# Patient Record
Sex: Male | Born: 1945
Health system: Southern US, Community
[De-identification: ages and names within clinical notes are randomized; demographics above are authoritative.]

## PROBLEM LIST (undated history)

## (undated) DIAGNOSIS — N390 Urinary tract infection, site not specified: Secondary | ICD-10-CM

## (undated) DIAGNOSIS — I951 Orthostatic hypotension: Secondary | ICD-10-CM

## (undated) DIAGNOSIS — W19XXXA Unspecified fall, initial encounter: Secondary | ICD-10-CM

## (undated) DIAGNOSIS — I700 Atherosclerosis of aorta: Secondary | ICD-10-CM

## (undated) DIAGNOSIS — T4145XA Adverse effect of unspecified anesthetic, initial encounter: Secondary | ICD-10-CM

## (undated) DIAGNOSIS — I10 Essential (primary) hypertension: Secondary | ICD-10-CM

## (undated) DIAGNOSIS — F0390 Unspecified dementia without behavioral disturbance: Secondary | ICD-10-CM

## (undated) DIAGNOSIS — N200 Calculus of kidney: Secondary | ICD-10-CM

## (undated) DIAGNOSIS — I639 Cerebral infarction, unspecified: Secondary | ICD-10-CM

## (undated) DIAGNOSIS — K5090 Crohn's disease, unspecified, without complications: Secondary | ICD-10-CM

## (undated) DIAGNOSIS — F329 Major depressive disorder, single episode, unspecified: Secondary | ICD-10-CM

## (undated) DIAGNOSIS — F419 Anxiety disorder, unspecified: Secondary | ICD-10-CM

## (undated) DIAGNOSIS — F32A Depression, unspecified: Secondary | ICD-10-CM

## (undated) DIAGNOSIS — M1990 Unspecified osteoarthritis, unspecified site: Secondary | ICD-10-CM

## (undated) DIAGNOSIS — Y92009 Unspecified place in unspecified non-institutional (private) residence as the place of occurrence of the external cause: Secondary | ICD-10-CM

## (undated) DIAGNOSIS — E7800 Pure hypercholesterolemia, unspecified: Secondary | ICD-10-CM

## (undated) DIAGNOSIS — E119 Type 2 diabetes mellitus without complications: Secondary | ICD-10-CM

## (undated) DIAGNOSIS — T8859XA Other complications of anesthesia, initial encounter: Secondary | ICD-10-CM

## (undated) HISTORY — PX: APPENDECTOMY: SHX54

## (undated) HISTORY — DX: Major depressive disorder, single episode, unspecified: F32.9

## (undated) HISTORY — PX: OTHER SURGICAL HISTORY: SHX169

## (undated) HISTORY — DX: Pure hypercholesterolemia, unspecified: E78.00

## (undated) HISTORY — DX: Crohn's disease, unspecified, without complications: K50.90

## (undated) HISTORY — DX: Type 2 diabetes mellitus without complications: E11.9

## (undated) HISTORY — PX: TONSILLECTOMY AND ADENOIDECTOMY: SHX28

## (undated) HISTORY — DX: Depression, unspecified: F32.A

## (undated) HISTORY — DX: Anxiety disorder, unspecified: F41.9

## (undated) HISTORY — DX: Cerebral infarction, unspecified: I63.9

## (undated) HISTORY — PX: TONSILLECTOMY: SUR1361

## (undated) HISTORY — PX: ANGIOPLASTY / STENTING FEMORAL: SUR30

## (undated) HISTORY — DX: Unspecified osteoarthritis, unspecified site: M19.90

## (undated) HISTORY — DX: Essential (primary) hypertension: I10

## (undated) HISTORY — DX: Calculus of kidney: N20.0

---

## 2000-06-18 ENCOUNTER — Ambulatory Visit (HOSPITAL_COMMUNITY): Admission: RE | Admit: 2000-06-18 | Discharge: 2000-06-18 | Payer: Self-pay | Admitting: Gastroenterology

## 2011-12-08 DIAGNOSIS — I2510 Atherosclerotic heart disease of native coronary artery without angina pectoris: Secondary | ICD-10-CM | POA: Diagnosis not present

## 2011-12-08 DIAGNOSIS — E785 Hyperlipidemia, unspecified: Secondary | ICD-10-CM | POA: Diagnosis not present

## 2011-12-08 DIAGNOSIS — I10 Essential (primary) hypertension: Secondary | ICD-10-CM | POA: Diagnosis not present

## 2011-12-08 DIAGNOSIS — Z79899 Other long term (current) drug therapy: Secondary | ICD-10-CM | POA: Diagnosis not present

## 2011-12-13 DIAGNOSIS — N23 Unspecified renal colic: Secondary | ICD-10-CM | POA: Diagnosis not present

## 2011-12-14 DIAGNOSIS — N3000 Acute cystitis without hematuria: Secondary | ICD-10-CM | POA: Diagnosis not present

## 2012-03-19 DIAGNOSIS — J019 Acute sinusitis, unspecified: Secondary | ICD-10-CM | POA: Diagnosis not present

## 2012-03-19 DIAGNOSIS — R05 Cough: Secondary | ICD-10-CM | POA: Diagnosis not present

## 2012-03-23 DIAGNOSIS — J209 Acute bronchitis, unspecified: Secondary | ICD-10-CM | POA: Diagnosis not present

## 2012-08-14 DIAGNOSIS — Z8711 Personal history of peptic ulcer disease: Secondary | ICD-10-CM | POA: Diagnosis not present

## 2012-08-14 DIAGNOSIS — K5080 Crohn's disease of both small and large intestine without complications: Secondary | ICD-10-CM | POA: Diagnosis not present

## 2012-09-20 DIAGNOSIS — K5080 Crohn's disease of both small and large intestine without complications: Secondary | ICD-10-CM | POA: Diagnosis not present

## 2012-09-20 DIAGNOSIS — K219 Gastro-esophageal reflux disease without esophagitis: Secondary | ICD-10-CM | POA: Diagnosis not present

## 2012-10-05 DIAGNOSIS — Z23 Encounter for immunization: Secondary | ICD-10-CM | POA: Diagnosis not present

## 2012-11-20 DIAGNOSIS — E119 Type 2 diabetes mellitus without complications: Secondary | ICD-10-CM | POA: Diagnosis not present

## 2012-11-20 DIAGNOSIS — M459 Ankylosing spondylitis of unspecified sites in spine: Secondary | ICD-10-CM | POA: Diagnosis not present

## 2012-11-20 DIAGNOSIS — F329 Major depressive disorder, single episode, unspecified: Secondary | ICD-10-CM | POA: Diagnosis not present

## 2012-11-20 DIAGNOSIS — E782 Mixed hyperlipidemia: Secondary | ICD-10-CM | POA: Diagnosis not present

## 2012-11-20 DIAGNOSIS — I10 Essential (primary) hypertension: Secondary | ICD-10-CM | POA: Diagnosis not present

## 2012-11-20 DIAGNOSIS — F411 Generalized anxiety disorder: Secondary | ICD-10-CM | POA: Diagnosis not present

## 2012-12-05 DIAGNOSIS — H25099 Other age-related incipient cataract, unspecified eye: Secondary | ICD-10-CM | POA: Diagnosis not present

## 2012-12-11 DIAGNOSIS — J209 Acute bronchitis, unspecified: Secondary | ICD-10-CM | POA: Diagnosis not present

## 2012-12-11 DIAGNOSIS — J018 Other acute sinusitis: Secondary | ICD-10-CM | POA: Diagnosis not present

## 2013-02-19 DIAGNOSIS — K589 Irritable bowel syndrome without diarrhea: Secondary | ICD-10-CM | POA: Diagnosis not present

## 2013-02-19 DIAGNOSIS — K219 Gastro-esophageal reflux disease without esophagitis: Secondary | ICD-10-CM | POA: Diagnosis not present

## 2013-03-21 DIAGNOSIS — K219 Gastro-esophageal reflux disease without esophagitis: Secondary | ICD-10-CM | POA: Diagnosis not present

## 2013-04-08 DIAGNOSIS — IMO0001 Reserved for inherently not codable concepts without codable children: Secondary | ICD-10-CM | POA: Diagnosis not present

## 2013-04-08 DIAGNOSIS — E559 Vitamin D deficiency, unspecified: Secondary | ICD-10-CM | POA: Diagnosis not present

## 2013-04-08 DIAGNOSIS — E782 Mixed hyperlipidemia: Secondary | ICD-10-CM | POA: Diagnosis not present

## 2013-04-08 DIAGNOSIS — R0789 Other chest pain: Secondary | ICD-10-CM | POA: Diagnosis not present

## 2013-04-08 DIAGNOSIS — E119 Type 2 diabetes mellitus without complications: Secondary | ICD-10-CM | POA: Diagnosis not present

## 2013-04-08 DIAGNOSIS — Z125 Encounter for screening for malignant neoplasm of prostate: Secondary | ICD-10-CM | POA: Diagnosis not present

## 2013-04-08 DIAGNOSIS — I10 Essential (primary) hypertension: Secondary | ICD-10-CM | POA: Diagnosis not present

## 2013-08-15 DIAGNOSIS — J209 Acute bronchitis, unspecified: Secondary | ICD-10-CM | POA: Diagnosis not present

## 2013-08-15 DIAGNOSIS — J018 Other acute sinusitis: Secondary | ICD-10-CM | POA: Diagnosis not present

## 2013-09-11 DIAGNOSIS — R0609 Other forms of dyspnea: Secondary | ICD-10-CM | POA: Diagnosis not present

## 2013-09-11 DIAGNOSIS — M542 Cervicalgia: Secondary | ICD-10-CM | POA: Diagnosis not present

## 2013-09-11 DIAGNOSIS — K5080 Crohn's disease of both small and large intestine without complications: Secondary | ICD-10-CM | POA: Diagnosis not present

## 2013-09-11 DIAGNOSIS — E119 Type 2 diabetes mellitus without complications: Secondary | ICD-10-CM | POA: Diagnosis not present

## 2013-09-11 DIAGNOSIS — R05 Cough: Secondary | ICD-10-CM | POA: Diagnosis not present

## 2013-09-11 DIAGNOSIS — R5381 Other malaise: Secondary | ICD-10-CM | POA: Diagnosis not present

## 2013-09-11 DIAGNOSIS — E559 Vitamin D deficiency, unspecified: Secondary | ICD-10-CM | POA: Diagnosis not present

## 2013-09-17 ENCOUNTER — Encounter: Payer: Self-pay | Admitting: Internal Medicine

## 2013-09-17 ENCOUNTER — Ambulatory Visit (INDEPENDENT_AMBULATORY_CARE_PROVIDER_SITE_OTHER)
Admission: RE | Admit: 2013-09-17 | Discharge: 2013-09-17 | Disposition: A | Discharge status: Home / Self Care | Payer: Medicare Other | Source: Ambulatory Visit | Attending: Internal Medicine | Admitting: Internal Medicine

## 2013-09-17 ENCOUNTER — Ambulatory Visit (INDEPENDENT_AMBULATORY_CARE_PROVIDER_SITE_OTHER): Payer: Medicare Other | Admitting: Internal Medicine

## 2013-09-17 VITALS — BP 140/88 | HR 62 | Temp 98.8°F | Ht 68.5 in | Wt 181.2 lb

## 2013-09-17 DIAGNOSIS — R0609 Other forms of dyspnea: Secondary | ICD-10-CM

## 2013-09-17 DIAGNOSIS — R0600 Dyspnea, unspecified: Secondary | ICD-10-CM

## 2013-09-17 DIAGNOSIS — R05 Cough: Secondary | ICD-10-CM

## 2013-09-17 DIAGNOSIS — R059 Cough, unspecified: Secondary | ICD-10-CM

## 2013-09-17 DIAGNOSIS — J45991 Cough variant asthma: Secondary | ICD-10-CM | POA: Insufficient documentation

## 2013-09-17 DIAGNOSIS — J3489 Other specified disorders of nose and nasal sinuses: Secondary | ICD-10-CM | POA: Diagnosis not present

## 2013-09-17 MED ORDER — NEBIVOLOL HCL 5 MG PO TABS: 5.0000 mg | ORAL_TABLET | Freq: Every day | ORAL | Status: DC

## 2013-09-17 NOTE — Patient Instructions (Addendum)
Prilosec 20 mg Take 2 x 30-60 min before first meal of the day  And Pepcid ac 20 mg at bedtime  Elevate the head of your bed if you can  Please see patient coordinator before you leave today  to schedule sinus ct asap  Finish prednisone  Stop uroxatrol(afuzolsin) and start bystolic 5 mg daily   GERD (REFLUX)  is an extremely common cause of respiratory symptoms, many times with no significant heartburn at all.    It can be treated with medication, but also with lifestyle changes including avoidance of late meals, excessive alcohol, smoking cessation, and avoid fatty foods, chocolate, peppermint, colas, red wine, and acidic juices such as orange juice.  NO MINT OR MENTHOL PRODUCTS SO NO COUGH DROPS  USE SUGARLESS CANDY INSTEAD (jolley ranchers or Stover's)  NO OIL BASED VITAMINS - use powdered substitutes.  Please schedule a follow up office visit in 2 weeks, sooner if needed all medications in hand

## 2013-09-17 NOTE — Progress Notes (Signed)
  Subjective:    Patient ID: Frank Phillips, male    DOB: 10-14-1946   MRN: [id number]  HPI  55 yowm never smoker never respiratory problems referred 09/17/2013 to pulmonary clinic by Dr Owens Shark for ? Copd  09/17/2013 1st [facility] office visit/ Bradford Cazier cc new onset around 2 y prior to OV  Onset cough and congestion presently year round esp worse x one month with hoarseness and much worse lying down with wheezing and tightness nightly when lie down,  Not better p proaire, mucus is green x one month x usually in am,  with ha generalized.  rx with prednisone x 4 days, biaxin, some better. Doe x any exertion above adls.   No obvious day to day or daytime variabilty or assoc  cp or chest tightness, subjective wheeze overt s     hb symptoms. No unusual exp hx or h/o childhood pna/ asthma or knowledge of premature birth.  Sleeping ok without nocturnal  or early am exacerbation  of respiratory  c/o's or need for noct saba. Also denies any obvious fluctuation of symptoms with weather or environmental changes or other aggravating or alleviating factors except as outlined above   Current Medications, Allergies, Complete Past Medical History, Past Surgical History, Family History, and Social History were reviewed in [employer] record.          Review of Systems  Constitutional: Negative for fever and unexpected weight change.  HENT: Positive for congestion, sinus pressure and sneezing. Negative for dental problem, ear pain, nosebleeds, postnasal drip, rhinorrhea, sore throat and trouble swallowing.   Eyes: Negative for redness and itching.  Respiratory: Positive for cough, chest tightness, shortness of breath and wheezing.   Cardiovascular: Negative for palpitations and leg swelling.  Gastrointestinal: Negative for nausea and vomiting.  Genitourinary: Negative for dysuria.  Musculoskeletal: Negative for joint swelling.  Skin: Negative for rash.  Neurological: Positive for  headaches.  Hematological: Does not bruise/bleed easily.  Psychiatric/Behavioral: Positive for dysphoric mood. The patient is nervous/anxious.        Objective:   Physical Exam  Wt Readings from Last 3 Encounters:  09/17/13 181 lb 3.2 oz (82.192 kg)      HEENT: nl dentition, turbinates, and orophanx. Nl external ear canals without cough reflex   NECK :  without JVD/Nodes/TM/ nl carotid upstrokes bilaterally   LUNGS: no acc muscle use, clear to A and P bilaterally without cough on insp or exp maneuvers   CV:  RRR  no s3 or murmur or increase in P2, no edema   ABD:  soft and nontender with nl excursion in the supine position. No bruits or organomegaly, bowel sounds nl  MS:  warm without deformities, calf tenderness, cyanosis or clubbing  SKIN: warm and dry without lesions    NEURO:  alert, approp, no deficits    cxr was done 09/11/13 and reported nl     Assessment & Plan:

## 2013-09-18 NOTE — Progress Notes (Signed)
Quick Note:  Spoke with pt and notified of results per Dr. Wert. Pt verbalized understanding and denied any questions.  ______ 

## 2013-09-19 NOTE — Assessment & Plan Note (Addendum)
-   sinus CT 09/17/2013 > Mucosal edema in the paranasal sinuses without air-fluid level.  The most common causes of chronic cough in immunocompetent adults include the following: upper airway cough syndrome (UACS), previously referred to as postnasal drip syndrome (PNDS), which is caused by variety of rhinosinus conditions; (2) asthma; (3) GERD; (4) chronic bronchitis from cigarette smoking or other inhaled environmental irritants; (5) nonasthmatic eosinophilic bronchitis; and (6) bronchiectasis.   These conditions, singly or in combination, have accounted for up to 94% of the causes of chronic cough in prospective studies.   Other conditions have constituted no >6% of the causes in prospective studies These have included bronchogenic carcinoma, chronic interstitial pneumonia, sarcoidosis, left ventricular failure, ACEI-induced cough, and aspiration from a condition associated with pharyngeal dysfunction.    Chronic cough is often simultaneously caused by more than one condition. A single cause has been found from 38 to 82% of the time, multiple causes from 18 to 62%. Multiply caused cough has been the result of three diseases up to 42% of the time.       Most likely this is  Classic Upper airway cough syndrome, so named because it's frequently impossible to sort out how much is  CR/sinusitis with freq throat clearing (which can be related to primary GERD)   vs  causing  secondary (" extra esophageal")  GERD from wide swings in gastric pressure that occur with throat clearing, often  promoting self use of mint and menthol lozenges that reduce the lower esophageal sphincter tone and exacerbate the problem further in a cyclical fashion.   These are the same pts (now being labeled as having "irritable larynx syndrome" by some cough centers) who not infrequently have a history of having failed to tolerate ace inhibitors,  dry powder inhalers or biphosphonates or report having atypical reflux symptoms that  don't respond to standard doses of PPI , and are easily confused as having aecopd or asthma flares by even experienced allergists/ pulmonologists.   For now max rx for gerd/rhinitis by completing abx plus 6 d pred and regroup in 2 weeks

## 2013-09-19 NOTE — Assessment & Plan Note (Addendum)
Symptoms are markedly disproportionate to objective findings and not clear this is a lung problem (clearly not copd in the classic sense)  but pt does appear to have difficult airway management issues.  DDX of  difficult airways managment all start with A and  include Adherence, Ace Inhibitors, Acid Reflux, Active Sinus Disease, Alpha 1 Antitripsin deficiency, Anxiety masquerading as Airways dz,  ABPA,  allergy(esp in young), Aspiration (esp in elderly), Adverse effects of DPI,  Active smokers, plus two Bs  = Bronchiectasis and Beta blocker use..and one C= CHF  Cross referencing with cough,  Acid reflux at top of list and needs to be eliminated as a leading suspect before proceeding down the ddx  ? adverse effect of drugs - the only one on his list with a potential pulmonary problem is uroxatrol so try off x 2 weeks and rx bystolic for hbp and see if urinary symptoms develop  ? Allergy > pred x 6 days only

## 2013-10-01 ENCOUNTER — Encounter: Payer: Self-pay | Admitting: Internal Medicine

## 2013-10-01 ENCOUNTER — Ambulatory Visit (INDEPENDENT_AMBULATORY_CARE_PROVIDER_SITE_OTHER): Payer: Medicare Other | Admitting: Internal Medicine

## 2013-10-01 VITALS — BP 130/72 | HR 55 | Temp 98.4°F | Ht 68.25 in | Wt 182.6 lb

## 2013-10-01 DIAGNOSIS — R0609 Other forms of dyspnea: Secondary | ICD-10-CM | POA: Diagnosis not present

## 2013-10-01 DIAGNOSIS — R0600 Dyspnea, unspecified: Secondary | ICD-10-CM

## 2013-10-01 DIAGNOSIS — R05 Cough: Secondary | ICD-10-CM | POA: Diagnosis not present

## 2013-10-01 MED ORDER — ALFUZOSIN HCL ER 10 MG PO TB24: 10.0000 mg | ORAL_TABLET | Freq: Every day | ORAL | Status: DC

## 2013-10-01 NOTE — Progress Notes (Signed)
Subjective:    Patient ID: Frank Phillips, male    DOB: [birth year]   MRN: [id number]    Brief patient profile:  12 yowm never smoker never respiratory problems referred 09/17/2013 to pulmonary clinic by Dr Owens Shark for ? Copd   History of Present Illness  09/17/2013 1st [facility] office visit/ Imir Brumbach cc new onset around 2 y prior to OV  Onset cough and congestion presently year round esp worse x one month with hoarseness and much worse lying down with wheezing and tightness nightly when lie down,  Not better p proaire, mucus is green x one month x usually in am,  with ha generalized.  rx with prednisone x 4 days, biaxin, some better. Doe x any exertion above adls rec Prilosec 20 mg Take 2 x 30-60 min before first meal of the day  And Pepcid ac 20 mg at bedtime Elevate the head of your bed if you can   schedule sinus ct asap> thickening only s a/f level  Finish prednisone Stop uroxatrol(afuzolsin) and start bystolic 5 mg daily  GERD diet    10/01/2013 f/u ov/Aaban Griep re: chronic and sob/ unexplained  Chief Complaint  Patient presents with  . Follow-up    Pt states breathing is much improved. No new co's today.  Improved to his satisfaction except more difficulty urination since d/c uroxatrol   No obvious day to day or daytime variabilty or assoc chronic cough or cp or chest tightness, subjective wheeze overt sinus or hb symptoms. No unusual exp hx or h/o childhood pna/ asthma or knowledge of premature birth.  Sleeping ok without nocturnal  or early am exacerbation  of respiratory  c/o's or need for noct saba. Also denies any obvious fluctuation of symptoms with weather or environmental changes or other aggravating or alleviating factors except as outlined above   Current Medications, Allergies, Complete Past Medical History, Past Surgical History, Family History, and Social History were reviewed in [employer] record.  ROS  The following are not active complaints  unless bolded sore throat, dysphagia, dental problems, itching, sneezing,  nasal congestion or excess/ purulent secretions, ear ache,   fever, chills, sweats, unintended wt loss, pleuritic or exertional cp, hemoptysis,  orthopnea pnd or leg swelling, presyncope, palpitations, heartburn, abdominal pain, anorexia, nausea, vomiting, diarrhea  or change in bowel or urinary habits, change in stools or urine, dysuria,hematuria,  rash, arthralgias, visual complaints, headache, numbness weakness or ataxia or problems with walking or coordination,  change in mood/affect or memory.                 Objective:   Physical Exam   Wt Readings from Last 3 Encounters:  10/01/13 182 lb 9.6 oz (82.827 kg)  09/17/13 181 lb 3.2 oz (82.192 kg)         HEENT: nl dentition, turbinates, and orophanx. Nl external ear canals without cough reflex   NECK :  without JVD/Nodes/TM/ nl carotid upstrokes bilaterally   LUNGS: no acc muscle use, clear to A and P bilaterally without cough on insp or exp maneuvers   CV:  RRR  no s3 or murmur or increase in P2, no edema   ABD:  soft and nontender with nl excursion in the supine position. No bruits or organomegaly, bowel sounds nl  MS:  warm without deformities, calf tenderness, cyanosis or clubbing  SKIN: warm and dry without lesions    NEURO:  alert, approp, no deficits    cxr was done 09/11/13  and reported nl     Assessment & Plan:

## 2013-10-01 NOTE — Patient Instructions (Addendum)
Adding uroxatrol back to see if your urination improves  Please schedule a follow up office visit in 4 weeks, sooner if needed with pft's on return  Late add : hand written  WITH ALL MEDICATIONS IN HAND

## 2013-10-02 NOTE — Assessment & Plan Note (Addendum)
Classic Upper airway cough syndrome, so named because it's frequently impossible to sort out how much is  CR/sinusitis with freq throat clearing (which can be related to primary GERD)   vs  causing  secondary (" extra esophageal")  GERD from wide swings in gastric pressure that occur with throat clearing, often  promoting self use of mint and menthol lozenges that reduce the lower esophageal sphincter tone and exacerbate the problem further in a cyclical fashion.   These are the same pts (now being labeled as having "irritable larynx syndrome" by some cough centers) who not infrequently have a history of having failed to tolerate ace inhibitors,  dry powder inhalers or biphosphonates or report having atypical reflux symptoms that don't respond to standard doses of PPI , and are easily confused as having aecopd or asthma flares by even experienced allergists/ pulmonologists.  Overall improved to his satisfaction > no change rx

## 2013-10-02 NOTE — Assessment & Plan Note (Signed)
Much better with rx for GERD > should continue this and try back on uroxatrol as doubt this had anything to do with cough or sob and the urination is worse off it     Each maintenance medication was reviewed in detail including most importantly the difference between maintenance and as needed and under what circumstances the prns are to be used.  Please see instructions for details which were reviewed in writing and the patient given a copy.

## 2013-11-05 ENCOUNTER — Encounter (INDEPENDENT_AMBULATORY_CARE_PROVIDER_SITE_OTHER): Payer: Self-pay

## 2013-11-05 ENCOUNTER — Encounter: Payer: Self-pay | Admitting: Internal Medicine

## 2013-11-05 ENCOUNTER — Ambulatory Visit (INDEPENDENT_AMBULATORY_CARE_PROVIDER_SITE_OTHER): Payer: Medicare Other | Admitting: Internal Medicine

## 2013-11-05 VITALS — BP 132/60 | HR 66 | Temp 98.2°F | Ht 68.0 in | Wt 184.0 lb

## 2013-11-05 DIAGNOSIS — R0609 Other forms of dyspnea: Secondary | ICD-10-CM | POA: Diagnosis not present

## 2013-11-05 DIAGNOSIS — N320 Bladder-neck obstruction: Secondary | ICD-10-CM | POA: Diagnosis not present

## 2013-11-05 DIAGNOSIS — R059 Cough, unspecified: Secondary | ICD-10-CM | POA: Diagnosis not present

## 2013-11-05 DIAGNOSIS — R0600 Dyspnea, unspecified: Secondary | ICD-10-CM

## 2013-11-05 DIAGNOSIS — R0989 Other specified symptoms and signs involving the circulatory and respiratory systems: Secondary | ICD-10-CM

## 2013-11-05 DIAGNOSIS — R05 Cough: Secondary | ICD-10-CM | POA: Diagnosis not present

## 2013-11-05 LAB — PULMONARY FUNCTION TEST
DL/VA % pred: 96 %
DL/VA: 4.33 ml/min/mmHg/L
DLCO UNC % PRED: 70 %
DLCO UNC: 20.92 ml/min/mmHg
FEF 25-75 PRE: 2.65 L/s
FEF 25-75 Post: 3.95 L/sec
FEF2575-%Change-Post: 49 %
FEF2575-%Pred-Post: 168 %
FEF2575-%Pred-Pre: 113 %
FEV1-%Change-Post: 9 %
FEV1-%Pred-Post: 89 %
FEV1-%Pred-Pre: 81 %
FEV1-PRE: 2.49 L
FEV1-Post: 2.72 L
FEV1FVC-%Change-Post: 4 %
FEV1FVC-%Pred-Pre: 111 %
FEV6-%CHANGE-POST: 5 %
FEV6-%PRED-POST: 81 %
FEV6-%Pred-Pre: 77 %
FEV6-POST: 3.16 L
FEV6-PRE: 3.01 L
FEV6FVC-%CHANGE-POST: 0 %
FEV6FVC-%PRED-POST: 106 %
FEV6FVC-%Pred-Pre: 105 %
FVC-%Change-Post: 4 %
FVC-%PRED-POST: 76 %
FVC-%PRED-PRE: 73 %
FVC-POST: 3.16 L
FVC-Pre: 3.03 L
POST FEV6/FVC RATIO: 100 %
Post FEV1/FVC ratio: 86 %
Pre FEV1/FVC ratio: 82 %
Pre FEV6/FVC Ratio: 99 %
RV % pred: 58 %
RV: 1.36 L
TLC % PRED: 72 %
TLC: 4.79 L

## 2013-11-05 MED ORDER — PREDNISONE 10 MG PO TABS: ORAL_TABLET | ORAL | Status: DC

## 2013-11-05 NOTE — Patient Instructions (Addendum)
Prednisone 10 mg take  4 each am x 2 days,  2 each am x 2 days,  1 each am x 2 days and stop   See Tammy NP w/in 2 weeks with all your medications, even over the counter meds, separated in two separate bags, the ones you take no matter what (the ones in your pillbox which you should bring with you)  vs the ones you stop once you feel better and take only as needed when you feel you need them.   Tammy  will generate for you a new user friendly medication calendar that will put [place] all on the same page re: your medication use.    Without this process, it simply isn't possible to assure that we are providing  your outpatient care  with  the attention to detail we feel you deserve.   If we cannot assure that you're getting that kind of care,  then we cannot manage your problem effectively from this clinic.  Once you have seen Tammy and we are sure that we're all on the same page with your medication use she will arrange follow up with me.  Next step is methacholine challenge test but would add reglan 10 mg at hs next ov (p doing accurate med rec) and schedule the MCT for 2 weeks later

## 2013-11-05 NOTE — Progress Notes (Signed)
Subjective:    Patient ID: Frank Phillips, male    DOB: June 01, 1946   MRN: [id number]    Brief patient profile:  46 yowm never smoker never respiratory problems referred 09/17/2013 to pulmonary clinic by Dr Owens Shark for ? Copd   History of Present Illness  09/17/2013 1st [facility] office visit/ Frank Phillips cc new onset around 2 y prior to OV  Onset cough and congestion presently year round esp worse x one month with hoarseness and much worse lying down with wheezing and tightness nightly when lie down,  Not better p proaire, mucus is green x one month x usually in am,  with ha generalized.  rx with prednisone x 4 days, biaxin, some better. Doe x any exertion above adls rec Prilosec 20 mg Take 2 x 30-60 min before first meal of the day  And Pepcid ac 20 mg at bedtime Elevate the head of your bed if you can   schedule sinus ct asap> thickening only s a/f level  Finish prednisone Stop uroxatrol(afuzolsin) and start bystolic 5 mg daily  GERD diet    10/01/2013 f/u ov/Frank Phillips re: chronic and sob/ unexplained  Chief Complaint  Patient presents with  . Follow-up    Pt states breathing is much improved. No new co's today.  Improved to his satisfaction except more difficulty urination since d/c uroxatrol  rec Adding uroxatrol back to see if your urination improves Please schedule a follow up office visit in 4 weeks, sooner if needed with pft's on return  Late add : hand written  WITH ALL MEDICATIONS IN HAND   11/05/2013 f/u ov/Frank Phillips re: noct cough/ wheeze x 2 years  No trouble at all with breathing during the day even with exertion but still urge to clear throat during the day  then after supper/ before bed at hs starts hearing wheezing but never wakes him from sound sleep.  Urination no better back on uroxatrol     No obvious day to day or daytime variabilty or assoc chronic cough or cp or chest tightness, subjective wheeze overt sinus or hb symptoms. No unusual exp hx or h/o childhood pna/ asthma  or knowledge of premature birth.  Sleeping ok without nocturnal  or early am exacerbation  of respiratory  c/o's or need for noct saba. Also denies any obvious fluctuation of symptoms with weather or environmental changes or other aggravating or alleviating factors except as outlined above   Current Medications, Allergies, Complete Past Medical History, Past Surgical History, Family History, and Social History were reviewed in [employer] record.  ROS  The following are not active complaints unless bolded sore throat, dysphagia, dental problems, itching, sneezing,  nasal congestion or excess/ purulent secretions, ear ache,   fever, chills, sweats, unintended wt loss, pleuritic or exertional cp, hemoptysis,  orthopnea pnd or leg swelling, presyncope, palpitations, heartburn, abdominal pain, anorexia, nausea, vomiting, diarrhea  or change in bowel or urinary habits= decreased fos, change urine= smaller amts, dysuria,hematuria,  rash, arthralgias, visual complaints, headache, numbness weakness or ataxia or problems with walking or coordination,  change in mood/affect or memory.                 Objective:   Physical Exam  Wt Readings from Last 3 Encounters:  11/05/13 184 lb (83.462 kg)  10/01/13 182 lb 9.6 oz (82.827 kg)  09/17/13 181 lb 3.2 oz (82.192 kg)            HEENT: nl dentition, turbinates, and  orophanx. Nl external ear canals without cough reflex   NECK :  without JVD/Nodes/TM/ nl carotid upstrokes bilaterally   LUNGS: no acc muscle use, clear to A and P bilaterally without cough on insp or exp maneuvers   CV:  RRR  no s3 or murmur or increase in P2, no edema   ABD:  Pot bellied contour but soft and nontender with nl excursion in the supine position. No bruits or organomegaly, bowel sounds nl  MS:  warm without deformities, calf tenderness, cyanosis or clubbing  SKIN: warm and dry without lesions    NEURO:  alert, approp, no deficits    cxr  was done 09/11/13 and reported nl      Assessment & Plan:

## 2013-11-05 NOTE — Progress Notes (Signed)
PFT done today. 

## 2013-11-06 DIAGNOSIS — N320 Bladder-neck obstruction: Secondary | ICD-10-CM | POA: Insufficient documentation

## 2013-11-06 NOTE — Assessment & Plan Note (Signed)
-   pft's 11/05/13 wnl except for large change in FEF 25-75 p B2  This could still be noct asthma and if so should respond to short course of prednisone but if not needs methacholine challenge to sort out but would add noct dose of reglan to rx while waiting for mct results

## 2013-11-06 NOTE — Assessment & Plan Note (Signed)
Not responding to uroxatrol which has been linked to cough/ sob reports and no contraindication to TURP if needed but defer to Dr Unk Lightning discretion whether to continue with his local urologist or refer to [facility]

## 2013-11-06 NOTE — Assessment & Plan Note (Signed)
-   sinus CT 09/17/2013 > Mucosal edema in the paranasal sinuses without air-fluid level.  Still strongly favor UACS over noct asthma.  UACS =   Upper airway cough syndrome, so named because it's frequently impossible to sort out how much is  CR/sinusitis with freq throat clearing (which can be related to primary GERD)   vs  causing  secondary (" extra esophageal")  GERD from wide swings in gastric pressure that occur with throat clearing, often  promoting self use of mint and menthol lozenges that reduce the lower esophageal sphincter tone and exacerbate the problem further in a cyclical fashion.   These are the same pts (now being labeled as having "irritable larynx syndrome" by some cough centers) who not infrequently have a history of having failed to tolerate ace inhibitors,  dry powder inhalers or biphosphonates or report having atypical reflux symptoms that don't respond to standard doses of PPI , and are easily confused as having aecopd or asthma flares by even experienced allergists/ pulmonologists.   For now rec continue max acid suppression/ diet then add reglan 10 mg at hs next ov p do accurate med reconciliation

## 2013-11-15 DIAGNOSIS — E119 Type 2 diabetes mellitus without complications: Secondary | ICD-10-CM | POA: Diagnosis not present

## 2013-11-18 DIAGNOSIS — N400 Enlarged prostate without lower urinary tract symptoms: Secondary | ICD-10-CM | POA: Diagnosis not present

## 2013-11-18 DIAGNOSIS — N318 Other neuromuscular dysfunction of bladder: Secondary | ICD-10-CM | POA: Diagnosis not present

## 2013-11-18 DIAGNOSIS — N390 Urinary tract infection, site not specified: Secondary | ICD-10-CM | POA: Diagnosis not present

## 2013-11-21 ENCOUNTER — Ambulatory Visit (INDEPENDENT_AMBULATORY_CARE_PROVIDER_SITE_OTHER): Payer: Medicare Other | Admitting: Adult Health

## 2013-11-21 ENCOUNTER — Encounter: Payer: Self-pay | Admitting: Adult Health

## 2013-11-21 VITALS — BP 124/60 | HR 57 | Temp 98.6°F | Ht 68.0 in | Wt 185.0 lb

## 2013-11-21 DIAGNOSIS — R059 Cough, unspecified: Secondary | ICD-10-CM

## 2013-11-21 DIAGNOSIS — R05 Cough: Secondary | ICD-10-CM

## 2013-11-21 NOTE — Assessment & Plan Note (Signed)
Mild upper airway cough ?rhinitis /GERD Frank Phillips  Set up for MCT  Cont on current regimen.  Patient's medications were reviewed today and patient education was given. Computerized medication calendar was adjusted/completed   Plan  Hold Fish oil .  May use Claritin 10 mg daily as needed. For drainage Follow med calendar closely and bring to each visit We're setting you up for a methacholine challenge test. Follow up Dr. Sherene Sires in 3-4 weeks and as needed.

## 2013-11-21 NOTE — Patient Instructions (Signed)
Hold Fish oil .  May use Claritin 10 mg daily as needed. For drainage Follow med calendar closely and bring to each visit We're setting you up for a methacholine challenge test. Follow up Dr. Melvyn Novas in 3-4 weeks and as needed.

## 2013-11-21 NOTE — Progress Notes (Signed)
Subjective:    Patient ID: Frank Phillips, male    DOB: [birth year]   MRN: [id number]   Brief patient profile:  60 yowm never smoker never respiratory problems referred 09/17/2013 to pulmonary clinic by Dr Owens Shark for ? Copd   History of Present Illness  09/17/2013 1st [facility] office visit/ Wert cc new onset around 2 y prior to OV  Onset cough and congestion presently year round esp worse x one month with hoarseness and much worse lying down with wheezing and tightness nightly when lie down,  Not better p proaire, mucus is green x one month x usually in am,  with ha generalized.  rx with prednisone x 4 days, biaxin, some better. Doe x any exertion above adls rec Prilosec 20 mg Take 2 x 30-60 min before first meal of the day  And Pepcid ac 20 mg at bedtime Elevate the head of your bed if you can   schedule sinus ct asap> thickening only s a/f level  Finish prednisone Stop uroxatrol(afuzolsin) and start bystolic 5 mg daily  GERD diet    10/01/2013 f/u ov/Wert re: chronic and sob/ unexplained  Chief Complaint  Patient presents with  . Follow-up    Pt states breathing is much improved. No new co's today.  Improved to his satisfaction except more difficulty urination since d/c uroxatrol  rec Adding uroxatrol back to see if your urination improves Please schedule a follow up office visit in 4 weeks, sooner if needed with pft's on return  Late add : hand written  WITH ALL MEDICATIONS IN HAND   11/05/2013 f/u ov/Wert re: noct cough/ wheeze x 2 years  No trouble at all with breathing during the day even with exertion but still urge to clear throat during the day  then after supper/ before bed at hs starts hearing wheezing but never wakes him from sound sleep.  Urination no better back on uroxatrol    >>prednisone rx   11/21/2013 Follow up and Med review  new med calendar - pt brought all meds with him today.  reports breathing is back to baseline. Does have mild dry cough. Feels like  he has drainage in throat.  Main symptoms are late at night and early in am .  Can hear a wheeze in throat at times.  No fever, discolored mucus., edema or chest pain.     Current Medications, Allergies, Complete Past Medical History, Past Surgical History, Family History, and Social History were reviewed in [employer] record.  ROS  The following are not active complaints unless bolded sore throat, dysphagia, dental problems, itching, sneezing,  nasal congestion or excess/ purulent secretions, ear ache,   fever, chills, sweats, unintended wt loss, pleuritic or exertional cp, hemoptysis,  orthopnea pnd or leg swelling, presyncope, palpitations, heartburn, abdominal pain, anorexia, nausea, vomiting, diarrhea  or change in bowel or  , dysuria,hematuria,  rash, arthralgias, visual complaints, headache, numbness weakness or ataxia or problems with walking or coordination,  change in mood/affect or memory.                 Objective:   Physical Exam       HEENT: nl dentition, turbinates, and orophanx. Nl external ear canals without cough reflex   NECK :  without JVD/Nodes/TM/ nl carotid upstrokes bilaterally   LUNGS: no acc muscle use, clear to A and P bilaterally without cough on insp or exp maneuvers   CV:  RRR  no s3 or murmur  or increase in P2, no edema   ABD:   soft and nontender with nl excursion in the supine position. No bruits or organomegaly, bowel sounds nl  MS:  warm without deformities, calf tenderness, cyanosis or clubbing  SKIN: warm and dry without lesions    NEURO:  alert, approp, no deficits    cxr was done 09/11/13 and reported nl      Assessment & Plan:

## 2013-12-02 DIAGNOSIS — N401 Enlarged prostate with lower urinary tract symptoms: Secondary | ICD-10-CM | POA: Diagnosis not present

## 2013-12-02 DIAGNOSIS — N410 Acute prostatitis: Secondary | ICD-10-CM | POA: Diagnosis not present

## 2013-12-02 DIAGNOSIS — N390 Urinary tract infection, site not specified: Secondary | ICD-10-CM | POA: Diagnosis not present

## 2013-12-02 DIAGNOSIS — N138 Other obstructive and reflux uropathy: Secondary | ICD-10-CM | POA: Diagnosis not present

## 2013-12-03 NOTE — Addendum Note (Signed)
Addended by: Boone Master E on: 12/03/2013 12:09 PM   Modules accepted: Orders, Medications

## 2013-12-04 ENCOUNTER — Ambulatory Visit (INDEPENDENT_AMBULATORY_CARE_PROVIDER_SITE_OTHER): Payer: Medicare Other | Admitting: Internal Medicine

## 2013-12-04 ENCOUNTER — Encounter: Payer: Self-pay | Admitting: Internal Medicine

## 2013-12-04 VITALS — BP 122/60 | HR 60 | Temp 98.8°F | Ht 68.0 in | Wt 186.0 lb

## 2013-12-04 DIAGNOSIS — J45991 Cough variant asthma: Secondary | ICD-10-CM | POA: Diagnosis not present

## 2013-12-04 DIAGNOSIS — R059 Cough, unspecified: Secondary | ICD-10-CM | POA: Diagnosis not present

## 2013-12-04 DIAGNOSIS — R05 Cough: Secondary | ICD-10-CM

## 2013-12-04 MED ORDER — BECLOMETHASONE DIPROPIONATE 40 MCG/ACT IN AERS: INHALATION_SPRAY | RESPIRATORY_TRACT | Status: DC

## 2013-12-04 NOTE — Assessment & Plan Note (Signed)
-   sinus CT 09/17/2013 > Mucosal edema in the paranasal sinuses without air-fluid level.  Response to prednisone and saba strongly suggests this is not upper airway cough syndrome at all but more likely cough variant asthma. The problem is that many ICS make this worse.  alvesco and qvar are the exceptions so worth trying 2bid and then schedule methacholine challenge if dx remains in doubt.    Each maintenance medication was reviewed in detail including most importantly the difference between maintenance and as needed and under what circumstances the prns are to be used.  Please see instructions for details which were reviewed in writing and the patient given a copy.    The proper method of use, as well as anticipated side effects, of a metered-dose inhaler are discussed and demonstrated to the patient. Improved effectiveness after extensive coaching during this visit to a level of approximately  75%

## 2013-12-04 NOTE — Progress Notes (Signed)
Subjective:    Patient ID: Frank Phillips, male    DOB: 06/17/1946   MRN: [id number]  Brief patient profile:  68 yowm never smoker never respiratory problems referred 09/17/2013 to pulmonary clinic by Dr Owens Shark for ? Copd with very min airflow obst on baseline pfts 11/05/13    History of Present Illness  09/17/2013 1st [facility] office visit/ Frank Phillips cc new onset around 2 y prior to OV  Onset cough and congestion presently year round esp worse x one month with hoarseness and much worse lying down with wheezing and tightness nightly when lie down,  Not better p proaire, mucus is green x one month x usually in am,  with ha generalized.  rx with prednisone x 4 days, biaxin, some better. Doe x any exertion above adls rec Prilosec 20 mg Take 2 x 30-60 min before first meal of the day  And Pepcid ac 20 mg at bedtime Elevate the head of your bed if you can   schedule sinus ct asap> thickening only s a/f level  Finish prednisone Stop uroxatrol(afuzolsin) and start bystolic 5 mg daily  GERD diet    10/01/2013 f/u ov/Frank Phillips re: chronic and sob/ unexplained  X 2 years  Chief Complaint  Patient presents with  . Follow-up    Pt states breathing is much improved. No new co's today.  Improved to his satisfaction except more difficulty urination since d/c uroxatrol  rec Add uroxatrol back to see if your urination improves Please schedule a follow up office visit in 4 weeks, sooner if needed with pft's on return  Late add : hand written  WITH ALL MEDICATIONS IN HAND   11/05/2013 f/u ov/Frank Phillips re: noct cough/ wheeze x 2 years  No trouble at all with breathing during the day even with exertion but still urge to clear throat during the day  then after supper/ before bed at hs starts hearing wheezing but never wakes him from sound sleep.  Urination no better back on uroxatrol    >>prednisone rx  > 100% better until 2-3 days after complete rx   11/21/2013 Follow up and Med review  new med calendar - pt  brought all meds with him today.  reports breathing is back to baseline. Does have mild dry cough. Feels like he has drainage in throat.  Main symptoms are late at night and early in am .  Can hear a wheeze in throat at times.  rec Hold Fish oil .  May use Claritin 10 mg daily as needed. For drainage Follow med calendar closely and bring to each visit We're setting you up for a methacholine challenge test   12/04/2013 f/u ov/Frank Phillips re: ? Cough variant asthma ? Chief Complaint  Patient presents with  . Follow-up    Pt states cough and SOB seem worse since the last visit. He is using proair 2 puffs at bedtime and this has helped some.   On prednisone no need for inhalers at all, but as wore off found he needed proair most every night to control cough and sob. Using med calendar though omitted saba and nasal steroids from list    No obvious day to day or daytime variabilty or assoc cp or chest tightness, subjective wheeze overt  r hb symptoms. No unusual exp hx or h/o childhood pna/ asthma or knowledge of premature birth.  Sleeping ok without nocturnal  or early am exacerbation  of respiratory  c/o's or need for noct saba. Also denies any  obvious fluctuation of symptoms with weather or environmental changes or other aggravating or alleviating factors except as outlined above   Current Medications, Allergies, Complete Past Medical History, Past Surgical History, Family History, and Social History were reviewed in [employer] record.  ROS  The following are not active complaints unless bolded sore throat, dysphagia, dental problems, itching, sneezing,  nasal congestion or excess/ purulent secretions, ear ache,   fever, chills, sweats, unintended wt loss, pleuritic or exertional cp, hemoptysis,  orthopnea pnd or leg swelling, presyncope, palpitations, heartburn, abdominal pain, anorexia, nausea, vomiting, diarrhea  or change in bowel or urinary habits, change in stools or  urine, dysuria,hematuria,  rash, arthralgias, visual complaints, headache, numbness weakness or ataxia or problems with walking or coordination,  change in mood/affect or memory.                   Objective:   Physical Exam   Wt Readings from Last 3 Encounters:  12/04/13 186 lb (84.369 kg)  11/21/13 185 lb (83.915 kg)  11/05/13 184 lb (83.462 kg)      Somber amb wm nad      HEENT: nl dentition, turbinates, and orophanx. Nl external ear canals without cough reflex   NECK :  without JVD/Nodes/TM/ nl carotid upstrokes bilaterally   LUNGS: no acc muscle use, clear to A and P bilaterally without cough on insp or exp maneuvers   CV:  RRR  no s3 or murmur or increase in P2, no edema   ABD:   soft and nontender with nl excursion in the supine position. No bruits or organomegaly, bowel sounds nl  MS:  warm without deformities, calf tenderness, cyanosis or clubbing  SKIN: warm and dry without lesions    NEURO:  alert, approp, no deficits    cxr was done 09/11/13 and reported nl      Assessment & Plan:

## 2013-12-04 NOTE — Patient Instructions (Signed)
Start alvesco (Qvar 40) Take 2 puffs first thing in am and then another 2 puffs about 12 hours later.  Continue as needed as (per med calendar)  proair up to 2 puffs every 4 hours as needed   if better in 2 weeks fill prescription for qvar and see me in 6 weeks If not better call [facility] at [phone] for methacholine challenge test

## 2014-01-14 DIAGNOSIS — R1013 Epigastric pain: Secondary | ICD-10-CM | POA: Diagnosis not present

## 2014-02-13 DIAGNOSIS — Z8711 Personal history of peptic ulcer disease: Secondary | ICD-10-CM | POA: Diagnosis not present

## 2014-04-01 DIAGNOSIS — J209 Acute bronchitis, unspecified: Secondary | ICD-10-CM | POA: Diagnosis not present

## 2014-04-01 DIAGNOSIS — F339 Major depressive disorder, recurrent, unspecified: Secondary | ICD-10-CM | POA: Diagnosis not present

## 2014-04-01 DIAGNOSIS — M459 Ankylosing spondylitis of unspecified sites in spine: Secondary | ICD-10-CM | POA: Diagnosis not present

## 2014-04-01 DIAGNOSIS — K219 Gastro-esophageal reflux disease without esophagitis: Secondary | ICD-10-CM | POA: Diagnosis not present

## 2014-04-10 DIAGNOSIS — I10 Essential (primary) hypertension: Secondary | ICD-10-CM | POA: Diagnosis not present

## 2014-04-10 DIAGNOSIS — E7800 Pure hypercholesterolemia, unspecified: Secondary | ICD-10-CM | POA: Diagnosis not present

## 2014-04-10 DIAGNOSIS — R51 Headache: Secondary | ICD-10-CM | POA: Diagnosis not present

## 2014-04-10 DIAGNOSIS — M25519 Pain in unspecified shoulder: Secondary | ICD-10-CM | POA: Diagnosis not present

## 2014-04-10 DIAGNOSIS — K219 Gastro-esophageal reflux disease without esophagitis: Secondary | ICD-10-CM | POA: Diagnosis not present

## 2014-04-10 DIAGNOSIS — E119 Type 2 diabetes mellitus without complications: Secondary | ICD-10-CM | POA: Diagnosis not present

## 2014-04-10 DIAGNOSIS — M542 Cervicalgia: Secondary | ICD-10-CM | POA: Diagnosis not present

## 2014-04-10 DIAGNOSIS — Z79899 Other long term (current) drug therapy: Secondary | ICD-10-CM | POA: Diagnosis not present

## 2014-04-10 DIAGNOSIS — M5030 Other cervical disc degeneration, unspecified cervical region: Secondary | ICD-10-CM | POA: Diagnosis not present

## 2014-04-15 DIAGNOSIS — R1013 Epigastric pain: Secondary | ICD-10-CM | POA: Diagnosis not present

## 2014-04-16 DIAGNOSIS — R1013 Epigastric pain: Secondary | ICD-10-CM | POA: Diagnosis not present

## 2014-04-16 DIAGNOSIS — K2960 Other gastritis without bleeding: Secondary | ICD-10-CM | POA: Diagnosis not present

## 2014-04-16 DIAGNOSIS — K2970 Gastritis, unspecified, without bleeding: Secondary | ICD-10-CM | POA: Diagnosis not present

## 2014-04-16 DIAGNOSIS — K319 Disease of stomach and duodenum, unspecified: Secondary | ICD-10-CM | POA: Diagnosis not present

## 2014-04-16 DIAGNOSIS — K2990 Gastroduodenitis, unspecified, without bleeding: Secondary | ICD-10-CM | POA: Diagnosis not present

## 2014-04-16 DIAGNOSIS — Z8711 Personal history of peptic ulcer disease: Secondary | ICD-10-CM | POA: Diagnosis not present

## 2014-04-25 DIAGNOSIS — H8309 Labyrinthitis, unspecified ear: Secondary | ICD-10-CM | POA: Diagnosis not present

## 2014-04-29 DIAGNOSIS — R634 Abnormal weight loss: Secondary | ICD-10-CM | POA: Diagnosis not present

## 2014-04-29 DIAGNOSIS — R1013 Epigastric pain: Secondary | ICD-10-CM | POA: Diagnosis not present

## 2014-04-29 DIAGNOSIS — N281 Cyst of kidney, acquired: Secondary | ICD-10-CM | POA: Diagnosis not present

## 2014-05-09 DIAGNOSIS — H93299 Other abnormal auditory perceptions, unspecified ear: Secondary | ICD-10-CM | POA: Diagnosis not present

## 2014-05-09 DIAGNOSIS — H9319 Tinnitus, unspecified ear: Secondary | ICD-10-CM | POA: Diagnosis not present

## 2014-05-21 DIAGNOSIS — IMO0001 Reserved for inherently not codable concepts without codable children: Secondary | ICD-10-CM | POA: Diagnosis not present

## 2014-06-03 DIAGNOSIS — H903 Sensorineural hearing loss, bilateral: Secondary | ICD-10-CM | POA: Diagnosis not present

## 2014-06-03 DIAGNOSIS — R42 Dizziness and giddiness: Secondary | ICD-10-CM | POA: Diagnosis not present

## 2014-06-03 DIAGNOSIS — H9319 Tinnitus, unspecified ear: Secondary | ICD-10-CM | POA: Diagnosis not present

## 2014-06-13 DIAGNOSIS — N318 Other neuromuscular dysfunction of bladder: Secondary | ICD-10-CM | POA: Diagnosis not present

## 2014-06-13 DIAGNOSIS — N390 Urinary tract infection, site not specified: Secondary | ICD-10-CM | POA: Diagnosis not present

## 2014-06-13 DIAGNOSIS — N401 Enlarged prostate with lower urinary tract symptoms: Secondary | ICD-10-CM | POA: Diagnosis not present

## 2014-06-13 DIAGNOSIS — R351 Nocturia: Secondary | ICD-10-CM | POA: Diagnosis not present

## 2014-06-13 DIAGNOSIS — N138 Other obstructive and reflux uropathy: Secondary | ICD-10-CM | POA: Diagnosis not present

## 2014-07-22 DIAGNOSIS — I2510 Atherosclerotic heart disease of native coronary artery without angina pectoris: Secondary | ICD-10-CM | POA: Diagnosis not present

## 2014-07-22 DIAGNOSIS — G250 Essential tremor: Secondary | ICD-10-CM | POA: Diagnosis not present

## 2014-07-22 DIAGNOSIS — IMO0001 Reserved for inherently not codable concepts without codable children: Secondary | ICD-10-CM | POA: Diagnosis not present

## 2014-07-22 DIAGNOSIS — Z23 Encounter for immunization: Secondary | ICD-10-CM | POA: Diagnosis not present

## 2014-07-22 DIAGNOSIS — K140 Glossitis: Secondary | ICD-10-CM | POA: Diagnosis not present

## 2014-08-26 DIAGNOSIS — M5030 Other cervical disc degeneration, unspecified cervical region: Secondary | ICD-10-CM | POA: Diagnosis not present

## 2014-08-26 DIAGNOSIS — Z79899 Other long term (current) drug therapy: Secondary | ICD-10-CM | POA: Diagnosis not present

## 2014-08-26 DIAGNOSIS — M4102 Infantile idiopathic scoliosis, cervical region: Secondary | ICD-10-CM | POA: Diagnosis not present

## 2014-08-26 DIAGNOSIS — M4124 Other idiopathic scoliosis, thoracic region: Secondary | ICD-10-CM | POA: Diagnosis not present

## 2014-08-26 DIAGNOSIS — M542 Cervicalgia: Secondary | ICD-10-CM | POA: Diagnosis not present

## 2014-08-26 DIAGNOSIS — M545 Low back pain: Secondary | ICD-10-CM | POA: Diagnosis not present

## 2014-08-26 DIAGNOSIS — M450 Ankylosing spondylitis of multiple sites in spine: Secondary | ICD-10-CM | POA: Diagnosis not present

## 2014-08-26 DIAGNOSIS — M2550 Pain in unspecified joint: Secondary | ICD-10-CM | POA: Diagnosis not present

## 2014-08-26 DIAGNOSIS — M549 Dorsalgia, unspecified: Secondary | ICD-10-CM | POA: Diagnosis not present

## 2014-08-26 DIAGNOSIS — M546 Pain in thoracic spine: Secondary | ICD-10-CM | POA: Diagnosis not present

## 2014-08-26 DIAGNOSIS — R51 Headache: Secondary | ICD-10-CM | POA: Diagnosis not present

## 2014-09-09 DIAGNOSIS — Z79899 Other long term (current) drug therapy: Secondary | ICD-10-CM | POA: Diagnosis not present

## 2014-09-09 DIAGNOSIS — M450 Ankylosing spondylitis of multiple sites in spine: Secondary | ICD-10-CM | POA: Diagnosis not present

## 2014-09-24 DIAGNOSIS — Z79899 Other long term (current) drug therapy: Secondary | ICD-10-CM | POA: Diagnosis not present

## 2014-09-24 DIAGNOSIS — M450 Ankylosing spondylitis of multiple sites in spine: Secondary | ICD-10-CM | POA: Diagnosis not present

## 2014-10-22 DIAGNOSIS — M2550 Pain in unspecified joint: Secondary | ICD-10-CM | POA: Diagnosis not present

## 2014-10-22 DIAGNOSIS — M450 Ankylosing spondylitis of multiple sites in spine: Secondary | ICD-10-CM | POA: Diagnosis not present

## 2014-10-22 DIAGNOSIS — Z79899 Other long term (current) drug therapy: Secondary | ICD-10-CM | POA: Diagnosis not present

## 2014-12-08 DIAGNOSIS — Z0000 Encounter for general adult medical examination without abnormal findings: Secondary | ICD-10-CM | POA: Diagnosis not present

## 2014-12-08 DIAGNOSIS — Z23 Encounter for immunization: Secondary | ICD-10-CM | POA: Diagnosis not present

## 2014-12-08 DIAGNOSIS — Z79899 Other long term (current) drug therapy: Secondary | ICD-10-CM | POA: Diagnosis not present

## 2014-12-08 DIAGNOSIS — G4709 Other insomnia: Secondary | ICD-10-CM | POA: Diagnosis not present

## 2014-12-08 DIAGNOSIS — E1165 Type 2 diabetes mellitus with hyperglycemia: Secondary | ICD-10-CM | POA: Diagnosis not present

## 2014-12-08 DIAGNOSIS — R251 Tremor, unspecified: Secondary | ICD-10-CM | POA: Diagnosis not present

## 2014-12-11 DIAGNOSIS — R351 Nocturia: Secondary | ICD-10-CM | POA: Diagnosis not present

## 2014-12-11 DIAGNOSIS — N401 Enlarged prostate with lower urinary tract symptoms: Secondary | ICD-10-CM | POA: Diagnosis not present

## 2014-12-11 DIAGNOSIS — N318 Other neuromuscular dysfunction of bladder: Secondary | ICD-10-CM | POA: Diagnosis not present

## 2014-12-11 DIAGNOSIS — N410 Acute prostatitis: Secondary | ICD-10-CM | POA: Diagnosis not present

## 2014-12-11 DIAGNOSIS — N3090 Cystitis, unspecified without hematuria: Secondary | ICD-10-CM | POA: Diagnosis not present

## 2014-12-19 DIAGNOSIS — Z79899 Other long term (current) drug therapy: Secondary | ICD-10-CM | POA: Diagnosis not present

## 2014-12-19 DIAGNOSIS — M450 Ankylosing spondylitis of multiple sites in spine: Secondary | ICD-10-CM | POA: Diagnosis not present

## 2014-12-31 DIAGNOSIS — Z6826 Body mass index (BMI) 26.0-26.9, adult: Secondary | ICD-10-CM | POA: Diagnosis not present

## 2014-12-31 DIAGNOSIS — R251 Tremor, unspecified: Secondary | ICD-10-CM | POA: Insufficient documentation

## 2015-01-14 DIAGNOSIS — G319 Degenerative disease of nervous system, unspecified: Secondary | ICD-10-CM | POA: Diagnosis not present

## 2015-01-14 DIAGNOSIS — R251 Tremor, unspecified: Secondary | ICD-10-CM | POA: Diagnosis not present

## 2015-01-14 DIAGNOSIS — Z8673 Personal history of transient ischemic attack (TIA), and cerebral infarction without residual deficits: Secondary | ICD-10-CM | POA: Diagnosis not present

## 2015-01-14 DIAGNOSIS — R51 Headache: Secondary | ICD-10-CM | POA: Diagnosis not present

## 2015-01-20 DIAGNOSIS — Z6827 Body mass index (BMI) 27.0-27.9, adult: Secondary | ICD-10-CM | POA: Diagnosis not present

## 2015-01-20 DIAGNOSIS — R251 Tremor, unspecified: Secondary | ICD-10-CM | POA: Diagnosis not present

## 2015-03-18 DIAGNOSIS — K5080 Crohn's disease of both small and large intestine without complications: Secondary | ICD-10-CM | POA: Diagnosis not present

## 2015-03-24 DIAGNOSIS — K219 Gastro-esophageal reflux disease without esophagitis: Secondary | ICD-10-CM | POA: Diagnosis not present

## 2015-03-24 DIAGNOSIS — K5080 Crohn's disease of both small and large intestine without complications: Secondary | ICD-10-CM | POA: Diagnosis not present

## 2015-03-24 DIAGNOSIS — K589 Irritable bowel syndrome without diarrhea: Secondary | ICD-10-CM | POA: Diagnosis not present

## 2015-03-24 DIAGNOSIS — Z8711 Personal history of peptic ulcer disease: Secondary | ICD-10-CM | POA: Diagnosis not present

## 2015-09-21 DIAGNOSIS — F339 Major depressive disorder, recurrent, unspecified: Secondary | ICD-10-CM | POA: Diagnosis not present

## 2015-09-21 DIAGNOSIS — F0390 Unspecified dementia without behavioral disturbance: Secondary | ICD-10-CM | POA: Diagnosis not present

## 2015-09-21 DIAGNOSIS — Z23 Encounter for immunization: Secondary | ICD-10-CM | POA: Diagnosis not present

## 2015-11-20 DIAGNOSIS — E119 Type 2 diabetes mellitus without complications: Secondary | ICD-10-CM | POA: Diagnosis not present

## 2015-11-20 DIAGNOSIS — R8290 Unspecified abnormal findings in urine: Secondary | ICD-10-CM | POA: Diagnosis not present

## 2015-11-20 DIAGNOSIS — E7800 Pure hypercholesterolemia, unspecified: Secondary | ICD-10-CM | POA: Diagnosis not present

## 2015-11-20 DIAGNOSIS — E872 Acidosis: Secondary | ICD-10-CM | POA: Diagnosis not present

## 2015-11-20 DIAGNOSIS — I959 Hypotension, unspecified: Secondary | ICD-10-CM | POA: Diagnosis not present

## 2015-11-20 DIAGNOSIS — N390 Urinary tract infection, site not specified: Secondary | ICD-10-CM | POA: Diagnosis not present

## 2015-11-20 DIAGNOSIS — K219 Gastro-esophageal reflux disease without esophagitis: Secondary | ICD-10-CM | POA: Diagnosis not present

## 2015-11-20 DIAGNOSIS — R531 Weakness: Secondary | ICD-10-CM | POA: Diagnosis not present

## 2015-11-20 DIAGNOSIS — G250 Essential tremor: Secondary | ICD-10-CM | POA: Diagnosis not present

## 2015-11-20 DIAGNOSIS — R6881 Early satiety: Secondary | ICD-10-CM | POA: Diagnosis not present

## 2015-11-20 DIAGNOSIS — I2510 Atherosclerotic heart disease of native coronary artery without angina pectoris: Secondary | ICD-10-CM | POA: Diagnosis not present

## 2015-11-20 DIAGNOSIS — R42 Dizziness and giddiness: Secondary | ICD-10-CM | POA: Diagnosis not present

## 2015-11-20 DIAGNOSIS — Z79899 Other long term (current) drug therapy: Secondary | ICD-10-CM | POA: Diagnosis not present

## 2015-11-20 DIAGNOSIS — M459 Ankylosing spondylitis of unspecified sites in spine: Secondary | ICD-10-CM | POA: Diagnosis not present

## 2015-11-20 DIAGNOSIS — N3000 Acute cystitis without hematuria: Secondary | ICD-10-CM | POA: Diagnosis not present

## 2015-11-20 DIAGNOSIS — Z8673 Personal history of transient ischemic attack (TIA), and cerebral infarction without residual deficits: Secondary | ICD-10-CM | POA: Diagnosis not present

## 2015-11-20 DIAGNOSIS — N179 Acute kidney failure, unspecified: Secondary | ICD-10-CM | POA: Diagnosis not present

## 2015-11-20 DIAGNOSIS — R0602 Shortness of breath: Secondary | ICD-10-CM | POA: Diagnosis not present

## 2015-11-20 DIAGNOSIS — I951 Orthostatic hypotension: Secondary | ICD-10-CM | POA: Diagnosis not present

## 2015-11-20 DIAGNOSIS — R000 Tachycardia, unspecified: Secondary | ICD-10-CM | POA: Diagnosis not present

## 2015-11-20 DIAGNOSIS — I10 Essential (primary) hypertension: Secondary | ICD-10-CM | POA: Diagnosis not present

## 2015-11-21 DIAGNOSIS — I959 Hypotension, unspecified: Secondary | ICD-10-CM | POA: Diagnosis not present

## 2015-11-21 DIAGNOSIS — R42 Dizziness and giddiness: Secondary | ICD-10-CM | POA: Diagnosis not present

## 2015-11-21 DIAGNOSIS — R8290 Unspecified abnormal findings in urine: Secondary | ICD-10-CM | POA: Diagnosis not present

## 2015-11-21 DIAGNOSIS — E872 Acidosis: Secondary | ICD-10-CM | POA: Diagnosis not present

## 2015-11-27 DIAGNOSIS — N179 Acute kidney failure, unspecified: Secondary | ICD-10-CM | POA: Diagnosis not present

## 2015-11-27 DIAGNOSIS — E782 Mixed hyperlipidemia: Secondary | ICD-10-CM | POA: Diagnosis not present

## 2015-11-27 DIAGNOSIS — Z79899 Other long term (current) drug therapy: Secondary | ICD-10-CM | POA: Diagnosis not present

## 2015-11-27 DIAGNOSIS — I951 Orthostatic hypotension: Secondary | ICD-10-CM | POA: Diagnosis not present

## 2015-11-27 DIAGNOSIS — E1165 Type 2 diabetes mellitus with hyperglycemia: Secondary | ICD-10-CM | POA: Diagnosis not present

## 2015-11-27 DIAGNOSIS — E860 Dehydration: Secondary | ICD-10-CM | POA: Diagnosis not present

## 2015-11-27 DIAGNOSIS — Z1389 Encounter for screening for other disorder: Secondary | ICD-10-CM | POA: Diagnosis not present

## 2015-12-09 DIAGNOSIS — E860 Dehydration: Secondary | ICD-10-CM | POA: Diagnosis not present

## 2015-12-15 DIAGNOSIS — Z0000 Encounter for general adult medical examination without abnormal findings: Secondary | ICD-10-CM | POA: Diagnosis not present

## 2015-12-15 DIAGNOSIS — E1165 Type 2 diabetes mellitus with hyperglycemia: Secondary | ICD-10-CM | POA: Diagnosis not present

## 2015-12-15 DIAGNOSIS — R251 Tremor, unspecified: Secondary | ICD-10-CM | POA: Diagnosis not present

## 2015-12-15 DIAGNOSIS — I10 Essential (primary) hypertension: Secondary | ICD-10-CM | POA: Diagnosis not present

## 2015-12-30 DIAGNOSIS — R351 Nocturia: Secondary | ICD-10-CM | POA: Diagnosis not present

## 2015-12-30 DIAGNOSIS — N318 Other neuromuscular dysfunction of bladder: Secondary | ICD-10-CM | POA: Diagnosis not present

## 2015-12-30 DIAGNOSIS — N3020 Other chronic cystitis without hematuria: Secondary | ICD-10-CM | POA: Diagnosis not present

## 2015-12-30 DIAGNOSIS — N401 Enlarged prostate with lower urinary tract symptoms: Secondary | ICD-10-CM | POA: Diagnosis not present

## 2015-12-30 DIAGNOSIS — R3912 Poor urinary stream: Secondary | ICD-10-CM | POA: Diagnosis not present

## 2016-02-17 DIAGNOSIS — L920 Granuloma annulare: Secondary | ICD-10-CM | POA: Diagnosis not present

## 2016-02-17 DIAGNOSIS — B079 Viral wart, unspecified: Secondary | ICD-10-CM | POA: Diagnosis not present

## 2016-02-17 DIAGNOSIS — L821 Other seborrheic keratosis: Secondary | ICD-10-CM | POA: Diagnosis not present

## 2016-03-03 DIAGNOSIS — G4730 Sleep apnea, unspecified: Secondary | ICD-10-CM | POA: Diagnosis not present

## 2016-03-08 DIAGNOSIS — N3000 Acute cystitis without hematuria: Secondary | ICD-10-CM | POA: Diagnosis not present

## 2016-03-08 DIAGNOSIS — R29818 Other symptoms and signs involving the nervous system: Secondary | ICD-10-CM | POA: Diagnosis not present

## 2016-03-08 DIAGNOSIS — E119 Type 2 diabetes mellitus without complications: Secondary | ICD-10-CM | POA: Diagnosis not present

## 2016-03-08 DIAGNOSIS — N390 Urinary tract infection, site not specified: Secondary | ICD-10-CM | POA: Diagnosis not present

## 2016-03-08 DIAGNOSIS — I493 Ventricular premature depolarization: Secondary | ICD-10-CM | POA: Diagnosis not present

## 2016-03-10 DIAGNOSIS — N281 Cyst of kidney, acquired: Secondary | ICD-10-CM | POA: Diagnosis not present

## 2016-03-10 DIAGNOSIS — R531 Weakness: Secondary | ICD-10-CM | POA: Diagnosis not present

## 2016-03-10 DIAGNOSIS — Z8673 Personal history of transient ischemic attack (TIA), and cerebral infarction without residual deficits: Secondary | ICD-10-CM | POA: Diagnosis not present

## 2016-03-10 DIAGNOSIS — Z955 Presence of coronary angioplasty implant and graft: Secondary | ICD-10-CM | POA: Diagnosis not present

## 2016-03-10 DIAGNOSIS — I2510 Atherosclerotic heart disease of native coronary artery without angina pectoris: Secondary | ICD-10-CM | POA: Diagnosis not present

## 2016-03-10 DIAGNOSIS — I10 Essential (primary) hypertension: Secondary | ICD-10-CM | POA: Diagnosis not present

## 2016-03-10 DIAGNOSIS — R10813 Right lower quadrant abdominal tenderness: Secondary | ICD-10-CM | POA: Diagnosis not present

## 2016-03-10 DIAGNOSIS — E119 Type 2 diabetes mellitus without complications: Secondary | ICD-10-CM | POA: Diagnosis not present

## 2016-03-10 DIAGNOSIS — R4182 Altered mental status, unspecified: Secondary | ICD-10-CM | POA: Diagnosis not present

## 2016-03-10 DIAGNOSIS — G4733 Obstructive sleep apnea (adult) (pediatric): Secondary | ICD-10-CM | POA: Diagnosis not present

## 2016-03-13 DIAGNOSIS — I2510 Atherosclerotic heart disease of native coronary artery without angina pectoris: Secondary | ICD-10-CM | POA: Diagnosis not present

## 2016-03-13 DIAGNOSIS — M6281 Muscle weakness (generalized): Secondary | ICD-10-CM | POA: Diagnosis not present

## 2016-03-13 DIAGNOSIS — R2681 Unsteadiness on feet: Secondary | ICD-10-CM | POA: Diagnosis not present

## 2016-03-13 DIAGNOSIS — Z7984 Long term (current) use of oral hypoglycemic drugs: Secondary | ICD-10-CM | POA: Diagnosis not present

## 2016-03-13 DIAGNOSIS — F329 Major depressive disorder, single episode, unspecified: Secondary | ICD-10-CM | POA: Diagnosis not present

## 2016-03-13 DIAGNOSIS — M450 Ankylosing spondylitis of multiple sites in spine: Secondary | ICD-10-CM | POA: Diagnosis not present

## 2016-03-13 DIAGNOSIS — N390 Urinary tract infection, site not specified: Secondary | ICD-10-CM | POA: Diagnosis not present

## 2016-03-13 DIAGNOSIS — E119 Type 2 diabetes mellitus without complications: Secondary | ICD-10-CM | POA: Diagnosis not present

## 2016-03-13 DIAGNOSIS — R413 Other amnesia: Secondary | ICD-10-CM | POA: Diagnosis not present

## 2016-03-13 DIAGNOSIS — I110 Hypertensive heart disease with heart failure: Secondary | ICD-10-CM | POA: Diagnosis not present

## 2016-03-13 DIAGNOSIS — Z79891 Long term (current) use of opiate analgesic: Secondary | ICD-10-CM | POA: Diagnosis not present

## 2016-03-13 DIAGNOSIS — I509 Heart failure, unspecified: Secondary | ICD-10-CM | POA: Diagnosis not present

## 2016-03-14 DIAGNOSIS — R2681 Unsteadiness on feet: Secondary | ICD-10-CM | POA: Diagnosis not present

## 2016-03-14 DIAGNOSIS — M450 Ankylosing spondylitis of multiple sites in spine: Secondary | ICD-10-CM | POA: Diagnosis not present

## 2016-03-14 DIAGNOSIS — M6281 Muscle weakness (generalized): Secondary | ICD-10-CM | POA: Diagnosis not present

## 2016-03-14 DIAGNOSIS — N390 Urinary tract infection, site not specified: Secondary | ICD-10-CM | POA: Diagnosis not present

## 2016-03-14 DIAGNOSIS — I110 Hypertensive heart disease with heart failure: Secondary | ICD-10-CM | POA: Diagnosis not present

## 2016-03-14 DIAGNOSIS — E119 Type 2 diabetes mellitus without complications: Secondary | ICD-10-CM | POA: Diagnosis not present

## 2016-03-15 DIAGNOSIS — E119 Type 2 diabetes mellitus without complications: Secondary | ICD-10-CM | POA: Diagnosis not present

## 2016-03-15 DIAGNOSIS — M450 Ankylosing spondylitis of multiple sites in spine: Secondary | ICD-10-CM | POA: Diagnosis not present

## 2016-03-15 DIAGNOSIS — M6281 Muscle weakness (generalized): Secondary | ICD-10-CM | POA: Diagnosis not present

## 2016-03-15 DIAGNOSIS — N390 Urinary tract infection, site not specified: Secondary | ICD-10-CM | POA: Diagnosis not present

## 2016-03-15 DIAGNOSIS — R2681 Unsteadiness on feet: Secondary | ICD-10-CM | POA: Diagnosis not present

## 2016-03-15 DIAGNOSIS — I110 Hypertensive heart disease with heart failure: Secondary | ICD-10-CM | POA: Diagnosis not present

## 2016-03-18 DIAGNOSIS — R2681 Unsteadiness on feet: Secondary | ICD-10-CM | POA: Diagnosis not present

## 2016-03-18 DIAGNOSIS — I110 Hypertensive heart disease with heart failure: Secondary | ICD-10-CM | POA: Diagnosis not present

## 2016-03-18 DIAGNOSIS — N390 Urinary tract infection, site not specified: Secondary | ICD-10-CM | POA: Diagnosis not present

## 2016-03-18 DIAGNOSIS — M450 Ankylosing spondylitis of multiple sites in spine: Secondary | ICD-10-CM | POA: Diagnosis not present

## 2016-03-18 DIAGNOSIS — E119 Type 2 diabetes mellitus without complications: Secondary | ICD-10-CM | POA: Diagnosis not present

## 2016-03-18 DIAGNOSIS — M6281 Muscle weakness (generalized): Secondary | ICD-10-CM | POA: Diagnosis not present

## 2016-03-22 DIAGNOSIS — G4733 Obstructive sleep apnea (adult) (pediatric): Secondary | ICD-10-CM | POA: Diagnosis not present

## 2016-03-22 DIAGNOSIS — M450 Ankylosing spondylitis of multiple sites in spine: Secondary | ICD-10-CM | POA: Diagnosis not present

## 2016-03-22 DIAGNOSIS — I110 Hypertensive heart disease with heart failure: Secondary | ICD-10-CM | POA: Diagnosis not present

## 2016-03-22 DIAGNOSIS — I10 Essential (primary) hypertension: Secondary | ICD-10-CM | POA: Diagnosis not present

## 2016-03-22 DIAGNOSIS — N390 Urinary tract infection, site not specified: Secondary | ICD-10-CM | POA: Diagnosis not present

## 2016-03-22 DIAGNOSIS — E119 Type 2 diabetes mellitus without complications: Secondary | ICD-10-CM | POA: Diagnosis not present

## 2016-03-22 DIAGNOSIS — R2681 Unsteadiness on feet: Secondary | ICD-10-CM | POA: Diagnosis not present

## 2016-03-22 DIAGNOSIS — M6281 Muscle weakness (generalized): Secondary | ICD-10-CM | POA: Diagnosis not present

## 2016-03-23 DIAGNOSIS — I110 Hypertensive heart disease with heart failure: Secondary | ICD-10-CM | POA: Diagnosis not present

## 2016-03-23 DIAGNOSIS — M6281 Muscle weakness (generalized): Secondary | ICD-10-CM | POA: Diagnosis not present

## 2016-03-23 DIAGNOSIS — N390 Urinary tract infection, site not specified: Secondary | ICD-10-CM | POA: Diagnosis not present

## 2016-03-23 DIAGNOSIS — E119 Type 2 diabetes mellitus without complications: Secondary | ICD-10-CM | POA: Diagnosis not present

## 2016-03-23 DIAGNOSIS — R2681 Unsteadiness on feet: Secondary | ICD-10-CM | POA: Diagnosis not present

## 2016-03-23 DIAGNOSIS — M450 Ankylosing spondylitis of multiple sites in spine: Secondary | ICD-10-CM | POA: Diagnosis not present

## 2016-03-25 DIAGNOSIS — N390 Urinary tract infection, site not specified: Secondary | ICD-10-CM | POA: Diagnosis not present

## 2016-03-25 DIAGNOSIS — E119 Type 2 diabetes mellitus without complications: Secondary | ICD-10-CM | POA: Diagnosis not present

## 2016-03-25 DIAGNOSIS — I110 Hypertensive heart disease with heart failure: Secondary | ICD-10-CM | POA: Diagnosis not present

## 2016-03-25 DIAGNOSIS — M6281 Muscle weakness (generalized): Secondary | ICD-10-CM | POA: Diagnosis not present

## 2016-03-25 DIAGNOSIS — M450 Ankylosing spondylitis of multiple sites in spine: Secondary | ICD-10-CM | POA: Diagnosis not present

## 2016-03-25 DIAGNOSIS — R2681 Unsteadiness on feet: Secondary | ICD-10-CM | POA: Diagnosis not present

## 2016-03-29 DIAGNOSIS — K5000 Crohn's disease of small intestine without complications: Secondary | ICD-10-CM | POA: Diagnosis not present

## 2016-03-29 DIAGNOSIS — K589 Irritable bowel syndrome without diarrhea: Secondary | ICD-10-CM | POA: Diagnosis not present

## 2016-03-31 DIAGNOSIS — I951 Orthostatic hypotension: Secondary | ICD-10-CM

## 2016-03-31 HISTORY — DX: Orthostatic hypotension: I95.1

## 2016-04-06 ENCOUNTER — Encounter (HOSPITAL_COMMUNITY): Payer: Self-pay | Admitting: *Deleted

## 2016-04-06 ENCOUNTER — Emergency Department (HOSPITAL_COMMUNITY): Payer: Medicare Other

## 2016-04-06 ENCOUNTER — Observation Stay (HOSPITAL_COMMUNITY)
Admission: EM | Admit: 2016-04-06 | Discharge: 2016-04-07 | Disposition: A | Discharge status: Home / Self Care | Payer: Medicare Other | Attending: Internal Medicine | Admitting: Internal Medicine

## 2016-04-06 DIAGNOSIS — S0990XA Unspecified injury of head, initial encounter: Secondary | ICD-10-CM

## 2016-04-06 DIAGNOSIS — K5090 Crohn's disease, unspecified, without complications: Secondary | ICD-10-CM | POA: Insufficient documentation

## 2016-04-06 DIAGNOSIS — R55 Syncope and collapse: Secondary | ICD-10-CM | POA: Diagnosis not present

## 2016-04-06 DIAGNOSIS — S199XXA Unspecified injury of neck, initial encounter: Secondary | ICD-10-CM | POA: Diagnosis not present

## 2016-04-06 DIAGNOSIS — J45991 Cough variant asthma: Secondary | ICD-10-CM

## 2016-04-06 DIAGNOSIS — Y9302 Activity, running: Secondary | ICD-10-CM | POA: Diagnosis not present

## 2016-04-06 DIAGNOSIS — E119 Type 2 diabetes mellitus without complications: Secondary | ICD-10-CM | POA: Insufficient documentation

## 2016-04-06 DIAGNOSIS — Z23 Encounter for immunization: Secondary | ICD-10-CM | POA: Diagnosis not present

## 2016-04-06 DIAGNOSIS — Y998 Other external cause status: Secondary | ICD-10-CM | POA: Diagnosis not present

## 2016-04-06 DIAGNOSIS — R0600 Dyspnea, unspecified: Secondary | ICD-10-CM

## 2016-04-06 DIAGNOSIS — Z79899 Other long term (current) drug therapy: Secondary | ICD-10-CM | POA: Insufficient documentation

## 2016-04-06 DIAGNOSIS — Y9289 Other specified places as the place of occurrence of the external cause: Secondary | ICD-10-CM | POA: Insufficient documentation

## 2016-04-06 DIAGNOSIS — Z7984 Long term (current) use of oral hypoglycemic drugs: Secondary | ICD-10-CM | POA: Insufficient documentation

## 2016-04-06 DIAGNOSIS — W01198A Fall on same level from slipping, tripping and stumbling with subsequent striking against other object, initial encounter: Secondary | ICD-10-CM | POA: Diagnosis not present

## 2016-04-06 DIAGNOSIS — Z7951 Long term (current) use of inhaled steroids: Secondary | ICD-10-CM | POA: Insufficient documentation

## 2016-04-06 DIAGNOSIS — W19XXXA Unspecified fall, initial encounter: Secondary | ICD-10-CM

## 2016-04-06 DIAGNOSIS — R5381 Other malaise: Secondary | ICD-10-CM | POA: Diagnosis not present

## 2016-04-06 DIAGNOSIS — F419 Anxiety disorder, unspecified: Secondary | ICD-10-CM | POA: Diagnosis not present

## 2016-04-06 DIAGNOSIS — N200 Calculus of kidney: Secondary | ICD-10-CM | POA: Diagnosis not present

## 2016-04-06 DIAGNOSIS — S0101XA Laceration without foreign body of scalp, initial encounter: Principal | ICD-10-CM

## 2016-04-06 DIAGNOSIS — N320 Bladder-neck obstruction: Secondary | ICD-10-CM

## 2016-04-06 DIAGNOSIS — S0003XA Contusion of scalp, initial encounter: Secondary | ICD-10-CM | POA: Diagnosis not present

## 2016-04-06 DIAGNOSIS — E7800 Pure hypercholesterolemia, unspecified: Secondary | ICD-10-CM | POA: Diagnosis not present

## 2016-04-06 DIAGNOSIS — I951 Orthostatic hypotension: Secondary | ICD-10-CM | POA: Diagnosis present

## 2016-04-06 DIAGNOSIS — M542 Cervicalgia: Secondary | ICD-10-CM | POA: Diagnosis not present

## 2016-04-06 DIAGNOSIS — S0181XA Laceration without foreign body of other part of head, initial encounter: Secondary | ICD-10-CM | POA: Diagnosis not present

## 2016-04-06 DIAGNOSIS — Y92009 Unspecified place in unspecified non-institutional (private) residence as the place of occurrence of the external cause: Secondary | ICD-10-CM

## 2016-04-06 DIAGNOSIS — I10 Essential (primary) hypertension: Secondary | ICD-10-CM | POA: Diagnosis not present

## 2016-04-06 DIAGNOSIS — R42 Dizziness and giddiness: Secondary | ICD-10-CM

## 2016-04-06 HISTORY — DX: Adverse effect of unspecified anesthetic, initial encounter: T41.45XA

## 2016-04-06 HISTORY — DX: Unspecified place in unspecified non-institutional (private) residence as the place of occurrence of the external cause: Y92.009

## 2016-04-06 HISTORY — DX: Orthostatic hypotension: I95.1

## 2016-04-06 HISTORY — DX: Other complications of anesthesia, initial encounter: T88.59XA

## 2016-04-06 HISTORY — DX: Unspecified place in unspecified non-institutional (private) residence as the place of occurrence of the external cause: W19.XXXA

## 2016-04-06 HISTORY — DX: Unspecified fall, initial encounter: W19.XXXA

## 2016-04-06 LAB — URINE MICROSCOPIC-ADD ON: RBC / HPF: NONE SEEN RBC/hpf (ref 0–5)

## 2016-04-06 LAB — CBC WITH DIFFERENTIAL/PLATELET
BASOS PCT: 0 %
Basophils Absolute: 0 10*3/uL (ref 0.0–0.1)
Eosinophils Absolute: 0 10*3/uL (ref 0.0–0.7)
Eosinophils Relative: 0 %
HEMATOCRIT: 34 % — AB (ref 39.0–52.0)
HEMOGLOBIN: 11 g/dL — AB (ref 13.0–17.0)
LYMPHS ABS: 1.1 10*3/uL (ref 0.7–4.0)
Lymphocytes Relative: 12 %
MCH: 26.8 pg (ref 26.0–34.0)
MCHC: 32.4 g/dL (ref 30.0–36.0)
MCV: 82.9 fL (ref 78.0–100.0)
MONOS PCT: 6 %
Monocytes Absolute: 0.6 10*3/uL (ref 0.1–1.0)
NEUTROS ABS: 7.5 10*3/uL (ref 1.7–7.7)
NEUTROS PCT: 82 %
Platelets: 207 10*3/uL (ref 150–400)
RBC: 4.1 MIL/uL — ABNORMAL LOW (ref 4.22–5.81)
RDW: 13.7 % (ref 11.5–15.5)
WBC: 9.2 10*3/uL (ref 4.0–10.5)

## 2016-04-06 LAB — BASIC METABOLIC PANEL
ANION GAP: 8 (ref 5–15)
BUN: 8 mg/dL (ref 6–20)
CHLORIDE: 103 mmol/L (ref 101–111)
CO2: 26 mmol/L (ref 22–32)
Calcium: 9.4 mg/dL (ref 8.9–10.3)
Creatinine, Ser: 0.91 mg/dL (ref 0.61–1.24)
GFR calc non Af Amer: >60 mL/min (ref >60)
Glucose, Bld: 156 mg/dL — ABNORMAL HIGH (ref 65–99)
Potassium: 3.5 mmol/L (ref 3.5–5.1)
Sodium: 137 mmol/L (ref 135–145)

## 2016-04-06 LAB — URINALYSIS, ROUTINE W REFLEX MICROSCOPIC
Glucose, UA: 100 mg/dL — AB
Hgb urine dipstick: NEGATIVE
Ketones, ur: 15 mg/dL — AB
NITRITE: NEGATIVE
PH: 6 (ref 5.0–8.0)
Protein, ur: NEGATIVE mg/dL
SPECIFIC GRAVITY, URINE: 1.024 (ref 1.005–1.030)

## 2016-04-06 LAB — TSH: TSH: 1.464 u[IU]/mL (ref 0.350–4.500)

## 2016-04-06 MED ORDER — MESALAMINE 1.2 G PO TBEC
2.4000 g | DELAYED_RELEASE_TABLET | Freq: Every day | ORAL | Status: DC
  Administered 2016-04-07: 2.4 g via ORAL
  Filled 2016-04-06: qty 2

## 2016-04-06 MED ORDER — LIDOCAINE-EPINEPHRINE (PF) 2 %-1:200000 IJ SOLN
20.0000 mL | Freq: Once | INTRAMUSCULAR | Status: AC
  Administered 2016-04-06: 20 mL via INTRADERMAL
  Filled 2016-04-06: qty 20

## 2016-04-06 MED ORDER — COLESEVELAM HCL 625 MG PO TABS
625.0000 mg | ORAL_TABLET | Freq: Two times a day (BID) | ORAL | Status: DC
  Administered 2016-04-06 – 2016-04-07 (×2): 625 mg via ORAL
  Filled 2016-04-06 (×3): qty 1

## 2016-04-06 MED ORDER — PANTOPRAZOLE SODIUM 40 MG PO TBEC
40.0000 mg | DELAYED_RELEASE_TABLET | Freq: Every day | ORAL | Status: DC
  Administered 2016-04-07: 40 mg via ORAL
  Filled 2016-04-06: qty 1

## 2016-04-06 MED ORDER — ALFUZOSIN HCL ER 10 MG PO TB24
10.0000 mg | ORAL_TABLET | Freq: Every day | ORAL | Status: DC
  Administered 2016-04-07: 10 mg via ORAL
  Filled 2016-04-06: qty 1

## 2016-04-06 MED ORDER — METFORMIN HCL 500 MG PO TABS: 1000.0000 mg | ORAL_TABLET | Freq: Two times a day (BID) | ORAL | Status: DC

## 2016-04-06 MED ORDER — VITAMIN D 1000 UNITS PO TABS
1000.0000 [IU] | ORAL_TABLET | Freq: Every day | ORAL | Status: DC
  Administered 2016-04-07: 1000 [IU] via ORAL
  Filled 2016-04-06: qty 1

## 2016-04-06 MED ORDER — MESALAMINE ER 0.375 G PO CP24: 1.5000 g | ORAL_CAPSULE | Freq: Every day | ORAL | Status: DC

## 2016-04-06 MED ORDER — DONEPEZIL HCL 10 MG PO TABS
10.0000 mg | ORAL_TABLET | Freq: Every day | ORAL | Status: DC
  Administered 2016-04-06: 10 mg via ORAL
  Filled 2016-04-06: qty 1

## 2016-04-06 MED ORDER — BUSPIRONE HCL 5 MG PO TABS
5.0000 mg | ORAL_TABLET | Freq: Every day | ORAL | Status: DC
  Administered 2016-04-07: 5 mg via ORAL
  Filled 2016-04-06: qty 1

## 2016-04-06 MED ORDER — PAROXETINE HCL 20 MG PO TABS
40.0000 mg | ORAL_TABLET | Freq: Every day | ORAL | Status: DC
  Administered 2016-04-07: 40 mg via ORAL
  Filled 2016-04-06: qty 2

## 2016-04-06 MED ORDER — ALBUTEROL SULFATE (2.5 MG/3ML) 0.083% IN NEBU: 2.5000 mg | INHALATION_SOLUTION | Freq: Four times a day (QID) | RESPIRATORY_TRACT | Status: DC | PRN

## 2016-04-06 MED ORDER — SODIUM CHLORIDE 0.9 % IV BOLUS (SEPSIS)
500.0000 mL | Freq: Once | INTRAVENOUS | Status: AC
  Administered 2016-04-06: 500 mL via INTRAVENOUS

## 2016-04-06 MED ORDER — CLOPIDOGREL BISULFATE 75 MG PO TABS
75.0000 mg | ORAL_TABLET | Freq: Every day | ORAL | Status: DC
  Administered 2016-04-07: 75 mg via ORAL
  Filled 2016-04-06: qty 1

## 2016-04-06 MED ORDER — TETANUS-DIPHTH-ACELL PERTUSSIS 5-2.5-18.5 LF-MCG/0.5 IM SUSP
0.5000 mL | Freq: Once | INTRAMUSCULAR | Status: AC
  Administered 2016-04-06: 0.5 mL via INTRAMUSCULAR
  Filled 2016-04-06: qty 0.5

## 2016-04-06 MED ORDER — SODIUM CHLORIDE 0.9% FLUSH
3.0000 mL | Freq: Two times a day (BID) | INTRAVENOUS | Status: DC
Start: 2016-04-06 — End: 2016-04-07
  Administered 2016-04-06: 3 mL via INTRAVENOUS

## 2016-04-06 MED ORDER — VITAMIN D-3 25 MCG (1000 UT) PO CAPS: 1.0000 | ORAL_CAPSULE | Freq: Every day | ORAL | Status: DC

## 2016-04-06 MED ORDER — ALBUTEROL SULFATE HFA 108 (90 BASE) MCG/ACT IN AERS: 2.0000 | INHALATION_SPRAY | Freq: Four times a day (QID) | RESPIRATORY_TRACT | Status: DC | PRN

## 2016-04-06 MED ORDER — BUPROPION HCL ER (SR) 150 MG PO TB12
150.0000 mg | ORAL_TABLET | Freq: Every day | ORAL | Status: DC
  Administered 2016-04-07: 150 mg via ORAL
  Filled 2016-04-06: qty 1

## 2016-04-06 MED ORDER — INSULIN ASPART 100 UNIT/ML ~~LOC~~ SOLN
0.0000 [IU] | Freq: Three times a day (TID) | SUBCUTANEOUS | Status: DC
  Administered 2016-04-07 (×2): 2 [IU] via SUBCUTANEOUS

## 2016-04-06 MED ORDER — NAPHAZOLINE-PHENIRAMINE 0.025-0.3 % OP SOLN
1.0000 [drp] | Freq: Four times a day (QID) | OPHTHALMIC | Status: DC | PRN
  Filled 2016-04-06: qty 5

## 2016-04-06 MED ORDER — ZOLPIDEM TARTRATE 5 MG PO TABS
5.0000 mg | ORAL_TABLET | Freq: Every evening | ORAL | Status: DC | PRN
  Administered 2016-04-07: 5 mg via ORAL
  Filled 2016-04-06: qty 1

## 2016-04-06 MED ORDER — ACETAMINOPHEN 325 MG PO TABS
650.0000 mg | ORAL_TABLET | Freq: Four times a day (QID) | ORAL | Status: DC | PRN
Start: 2016-04-06 — End: 2016-04-07
  Administered 2016-04-06: 650 mg via ORAL
  Filled 2016-04-06: qty 2

## 2016-04-06 MED ORDER — PAROXETINE HCL 20 MG PO TABS: 40.0000 mg | ORAL_TABLET | ORAL | Status: DC

## 2016-04-06 NOTE — ED Notes (Signed)
Pt arrived by  ems for a fall in bathroom today, hit door frame and has approx 6inch laceration to head. Pt is on thinners. Per ems, had syncopal episode after standing up.

## 2016-04-06 NOTE — ED Notes (Signed)
Attempted report 

## 2016-04-06 NOTE — ED Notes (Signed)
Pt unable to provide urinalysis at this time

## 2016-04-06 NOTE — CV Procedure (Signed)
Patient initially seen and evaluated by Dr. Eulis Foster for fall at home with scalp laceration. There was concern that he may lack she had a syncopal episode at home. ECG here shows nonspecific T-wave flattening and no other changes. Orthostatic vital signs show dramatic drop in blood pressure, although he actually did not get dizzy when this occurred. Systolic blood pressure in standing was 78. Case is discussed with Dr. Doyle Askew of [facility] who agrees to admit the patient under observation status.  Results for orders placed or performed during the hospital encounter of 04/06/16  CBC with Differential  Result Value Ref Range   WBC 9.2 4.0 - 10.5 K/uL   RBC 4.10 (L) 4.22 - 5.81 MIL/uL   Hemoglobin 11.0 (L) 13.0 - 17.0 g/dL   HCT 34.0 (L) 39.0 - 52.0 %   MCV 82.9 78.0 - 100.0 fL   MCH 26.8 26.0 - 34.0 pg   MCHC 32.4 30.0 - 36.0 g/dL   RDW 13.7 11.5 - 15.5 %   Platelets 207 150 - 400 K/uL   Neutrophils Relative % 82 %   Neutro Abs 7.5 1.7 - 7.7 K/uL   Lymphocytes Relative 12 %   Lymphs Abs 1.1 0.7 - 4.0 K/uL   Monocytes Relative 6 %   Monocytes Absolute 0.6 0.1 - 1.0 K/uL   Eosinophils Relative 0 %   Eosinophils Absolute 0.0 0.0 - 0.7 K/uL   Basophils Relative 0 %   Basophils Absolute 0.0 0.0 - 0.1 K/uL   Ct Head Wo Contrast  04/06/2016  CLINICAL DATA:  70 year old male who fell in bathroom today. Head laceration, on anti coagulation. Syncopal episode. Pain. Initial encounter. EXAM: CT HEAD WITHOUT CONTRAST CT CERVICAL SPINE WITHOUT CONTRAST TECHNIQUE: Multidetector CT imaging of the head and cervical spine was performed following the standard protocol without intravenous contrast. Multiplanar CT image reconstructions of the cervical spine were also generated. COMPARISON:  Paranasal sinus CT 09/17/2013. FINDINGS: CT HEAD FINDINGS Well pneumatized paranasal sinuses and mastoids, probable small left maxillary mucous retention cyst. Broad-based left anterior scalp laceration with hematoma  measuring up to 11 mm in thickness. Trace subcutaneous gas. Underlying left frontal bone is intact. Visualized orbit soft tissues are within normal limits. Other scalp soft tissues are within normal limits. No calvarium fracture. Calcified atherosclerosis at the skull base. Tortuosity and mild dolichoectasia at the vertebrobasilar junction with prominent calcified plaque. No acute intracranial hemorrhage identified. No midline shift, mass effect, or evidence of intracranial mass lesion. No ventriculomegaly. No cortically based acute infarct identified. Small areas of cerebral white matter hypodensity, including involvement of the right external and internal capsules suggesting chronic lacunar infarcts. No suspicious intracranial vascular hyperdensity. CT CERVICAL SPINE FINDINGS Visualized skull base is intact. No atlanto-occipital dissociation. 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 Chronic ankylosis versus congenital incomplete segmentation of C2-C3, with interbody and bilateral posterior element fusion. Straightening of upper cervical lordosis. Cervicothoracic junction alignment is within normal limits. Bilateral posterior element alignment is within normal limits. There is also bilateral posterior element ankylosis at C6-C7. Advanced chronic disc and endplate degeneration at C5-C6. Right occipital condyles -C1-C2 joint space loss with degenerative subchondral sclerosis, cysts, and spurring. No acute cervical spine fracture. Visualized upper thoracic levels appear grossly intact, with T1-T2 interbody and posterior element ankylosis. Negative lung apices. Calcified carotid artery atherosclerosis in the neck. Otherwise negative noncontrast paraspinal soft tissues. IMPRESSION: 1. Left scalp hematoma and laceration without underlying fracture. 2. Evidence of chronic white matter small vessel disease. No acute  intracranial abnormality. 3. No acute fracture or listhesis identified in the cervical spine.  Ligamentous injury is not excluded. 4. Intermittent cervical and upper thoracic spine ankylosis. Electronically Signed   By: Genevie Ann M.D.   On: 04/06/2016 15:08   Ct Cervical Spine Wo Contrast  04/06/2016  CLINICAL DATA:  69 year old male who fell in bathroom today. Head laceration, on anti coagulation. Syncopal episode. Pain. Initial encounter. EXAM: CT HEAD WITHOUT CONTRAST CT CERVICAL SPINE WITHOUT CONTRAST TECHNIQUE: Multidetector CT imaging of the head and cervical spine was performed following the standard protocol without intravenous contrast. Multiplanar CT image reconstructions of the cervical spine were also generated. COMPARISON:  Paranasal sinus CT 09/17/2013. FINDINGS: CT HEAD FINDINGS Well pneumatized paranasal sinuses and mastoids, probable small left maxillary mucous retention cyst. Broad-based left anterior scalp laceration with hematoma measuring up to 11 mm in thickness. Trace subcutaneous gas. Underlying left frontal bone is intact. Visualized orbit soft tissues are within normal limits. Other scalp soft tissues are within normal limits. No calvarium fracture. Calcified atherosclerosis at the skull base. Tortuosity and mild dolichoectasia at the vertebrobasilar junction with prominent calcified plaque. No acute intracranial hemorrhage identified. No midline shift, mass effect, or evidence of intracranial mass lesion. No ventriculomegaly. No cortically based acute infarct identified. Small areas of cerebral white matter hypodensity, including involvement of the right external and internal capsules suggesting chronic lacunar infarcts. No suspicious intracranial vascular hyperdensity. CT CERVICAL SPINE FINDINGS Visualized skull base is intact. No atlanto-occipital dissociation. 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 Chronic ankylosis versus congenital incomplete segmentation of C2-C3, with interbody and bilateral posterior element fusion. Straightening of upper cervical lordosis.  Cervicothoracic junction alignment is within normal limits. Bilateral posterior element alignment is within normal limits. There is also bilateral posterior element ankylosis at C6-C7. Advanced chronic disc and endplate degeneration at C5-C6. Right occipital condyles -C1-C2 joint space loss with degenerative subchondral sclerosis, cysts, and spurring. No acute cervical spine fracture. Visualized upper thoracic levels appear grossly intact, with T1-T2 interbody and posterior element ankylosis. Negative lung apices. Calcified carotid artery atherosclerosis in the neck. Otherwise negative noncontrast paraspinal soft tissues. IMPRESSION: 1. Left scalp hematoma and laceration without underlying fracture. 2. Evidence of chronic white matter small vessel disease. No acute intracranial abnormality. 3. No acute fracture or listhesis identified in the cervical spine. Ligamentous injury is not excluded. 4. Intermittent cervical and upper thoracic spine ankylosis. Electronically Signed   By: Genevie Ann M.D.   On: 04/06/2016 15:08     EKG Interpretation  Date/Time:  Wednesday April 06 2016 13:56:51 EDT Ventricular Rate:  71 PR Interval:    QRS Duration: 67 QT Interval:  250 QTC Calculation: 271 R Axis:   13 Text Interpretation:  Accelerated junctional rhythm Abnormal R-wave progression, early transition ST elevation suggests acute pericarditis Baseline wander in lead(s) I II aVR aVL aVF No old tracing to compare Confirmed by [facility]  MD, ELLIOTT [phone]) on 04/06/2016 4:55:54 PM

## 2016-04-06 NOTE — ED Provider Notes (Signed)
CSN: [id number]     Arrival date & time 04/06/16  1320 History   First MD Initiated Contact with Patient 04/06/16 1323     Chief Complaint  Patient presents with  . Fall     (Consider location/radiation/quality/duration/timing/severity/associated sxs/prior Treatment) HPI   Frank Phillips is a 70 y.o. male who is here for evaluation of a head injury. He was running in his hallway, to get something near the bathroom when he fell, striking his head on a door frame. He is reported to have lost consciousness after standing. He arrives for evaluation, by private vehicle. He describes being in a rash, while running in the hallway because he was getting ready to "go to the beach". He denies neck pain, back pain, extremity pain, nausea, vomiting, weakness or dizziness. There are no other known modifying factors.     Past Medical History  Diagnosis Date  . High blood pressure   . Diabetes (HCC)   . High cholesterol   . Stroke (HCC)   . Kidney stones   . Arthritis   . Depression   . Anxiety   . Crohn disease [facility])    Past Surgical History  Procedure Laterality Date  . Appendectomy    . Angioplasty / stenting femoral    . Kidney stones    . Tonsillectomy and adenoidectomy     Family History  Problem Relation Age of Onset  . Cancer Sister     skin   Social History  Substance Use Topics  . Smoking status: Never Smoker   . Smokeless tobacco: None  . Alcohol Use: No    Review of Systems  All other systems reviewed and are negative.     Allergies  Levaquin  Home Medications   Prior to Admission medications   Medication Sig Start Date End Date Taking? Authorizing Provider  albuterol (PROAIR HFA) 108 (90 BASE) MCG/ACT inhaler Inhale 2 puffs into the lungs every 6 (six) hours as needed.    Yes Historical Provider, MD  alfuzosin (UROXATRAL) 10 MG 24 hr tablet Take 1 tablet (10 mg total) by mouth daily with breakfast. 10/01/13  Yes Nyoka Cowden, MD  APRISO 0.375 g 24 hr capsule  Take 0.375 g by mouth daily as needed. pain 03/29/16   Historical Provider, MD  beclomethasone (QVAR) 40 MCG/ACT inhaler Take 2 puffs first thing in am and then another 2 puffs about 12 hours later. 12/04/13   Nyoka Cowden, MD  [facility] SR) 150 MG 12 hr tablet Take 150 mg by mouth daily. 03/22/16  Yes Historical Provider, MD  Cholecalciferol (VITAMIN D-3) 1000 UNITS CAPS Take 1 capsule by mouth daily.   Yes Historical Provider, MD  clopidogrel (PLAVIX) 75 MG tablet Take 75 mg by mouth daily with breakfast.   Yes Historical Provider, MD  colesevelam (WELCHOL) 625 MG tablet 625 mg. 3 tabs by mouth twice daily   Yes Historical Provider, MD  famotidine (PEPCID) 20 MG tablet Take 20 mg by mouth at bedtime.    Historical Provider, MD  loratadine (CLARITIN) 10 MG tablet Take 10 mg by mouth daily as needed for allergies.    Historical Provider, MD  metFORMIN (GLUCOPHAGE) 1000 MG tablet Take 1,000 mg by mouth 2 (two) times daily with a meal.   Yes Historical Provider, MD  nebivolol (BYSTOLIC) 5 MG tablet Take 1 tablet (5 mg total) by mouth daily. 09/17/13   Nyoka Cowden, MD  omeprazole (PRILOSEC) 20 MG capsule 2 capsules by  mouth every morning before breakfast   Yes Historical Provider, MD  PARoxetine (PAXIL) 40 MG tablet Take 40 mg by mouth every morning.   Yes Historical Provider, MD  tamsulosin (FLOMAX) 0.4 MG CAPS capsule Take 1 capsule by mouth at bedtime. 11/18/13  Yes Historical Provider, MD  zolpidem (AMBIEN) 10 MG tablet Take 10 mg by mouth at bedtime as needed for sleep.   Yes Historical Provider, MD   BP 143/74 mmHg  Pulse 70  Temp(Src) 97.4 F (36.3 C) (Oral)  Resp 22  SpO2 99% Physical Exam  Constitutional: He is oriented to person, place, and time. He appears well-developed.  Elderly, somewhat frail  HENT:  Head: Normocephalic.  Right Ear: External ear normal.  Left Ear: External ear normal.  Midfrontal scalp laceration left parietal scalp laceration. No palpable  Eyes:  Conjunctivae and EOM are normal. Pupils are equal, round, and reactive to light.  Neck: Normal range of motion and phonation normal. Neck supple.  Cardiovascular: Normal rate, regular rhythm and normal heart sounds.   Pulmonary/Chest: Effort normal and breath sounds normal. He exhibits no bony tenderness.  Abdominal: Soft. There is no tenderness.  Musculoskeletal: Normal range of motion.  Neurological: He is alert and oriented to person, place, and time. No cranial nerve deficit or sensory deficit. He exhibits normal muscle tone. Coordination normal.  Skin: Skin is warm, dry and intact.  Psychiatric: He has a normal mood and affect. His behavior is normal. Judgment and thought content normal.  Nursing note and vitals reviewed.   ED Course  Procedures (including critical care time)   Medications  sodium chloride 0.9 % bolus 500 mL (not administered)  Tdap (BOOSTRIX) injection 0.5 mL (0.5 mLs Intramuscular Given 04/06/16 1544)  lidocaine-EPINEPHrine (XYLOCAINE W/EPI) 2 %-1:200000 (PF) injection 20 mL (20 mLs Intradermal Given 04/06/16 1545)    Patient Vitals for the past 24 hrs:  BP Temp Temp src Pulse Resp SpO2  04/06/16 1323 143/74 mmHg 97.4 F (36.3 C) Oral 70 22 99 %    4:27 PM Reevaluation with update and discussion. After initial assessment and treatment, an updated evaluation reveals After suture repair, frontal scalp wound, bleeding controlled. Patient's daughter is now here and gives additional history, not given earlier. The patient has ongoing dizziness related to not eating, for the last couple of months. As recently evaluated in an outlying emergency department for "dehydration". He apparently eats only one meal a day, because he "forgets". He has been recently diagnosed with dementia. He also has occasional cramping sensation in his abdomen, which resolve spontaneously. There been no other reported illnesses, including fever, nausea, cough, or change in bowel and urinary habits.  At this time. Additional evaluation, ordered to assess for metabolic infectious or hemodynamic abnormalities. [name]    Labs Review Labs Reviewed  URINE CULTURE  BASIC METABOLIC PANEL  CBC WITH DIFFERENTIAL/PLATELET  URINALYSIS, ROUTINE W REFLEX MICROSCOPIC (NOT AT [facility])    Imaging Review Ct Head Wo Contrast  04/06/2016  CLINICAL DATA:  70 year old male who fell in bathroom today. Head laceration, on anti coagulation. Syncopal episode. Pain. Initial encounter. EXAM: CT HEAD WITHOUT CONTRAST CT CERVICAL SPINE WITHOUT CONTRAST TECHNIQUE: Multidetector CT imaging of the head and cervical spine was performed following the standard protocol without intravenous contrast. Multiplanar CT image reconstructions of the cervical spine were also generated. COMPARISON:  Paranasal sinus CT 09/17/2013. FINDINGS: CT HEAD FINDINGS Well pneumatized paranasal sinuses and mastoids, probable small left maxillary mucous retention cyst. Broad-based left anterior scalp laceration  with hematoma measuring up to 11 mm in thickness. Trace subcutaneous gas. Underlying left frontal bone is intact. Visualized orbit soft tissues are within normal limits. Other scalp soft tissues are within normal limits. No calvarium fracture. Calcified atherosclerosis at the skull base. Tortuosity and mild dolichoectasia at the vertebrobasilar junction with prominent calcified plaque. No acute intracranial hemorrhage identified. No midline shift, mass effect, or evidence of intracranial mass lesion. No ventriculomegaly. No cortically based acute infarct identified. Small areas of cerebral white matter hypodensity, including involvement of the right external and internal capsules suggesting chronic lacunar infarcts. No suspicious intracranial vascular hyperdensity. CT CERVICAL SPINE FINDINGS Visualized skull base is intact. No atlanto-occipital dissociation. 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 Chronic ankylosis versus  congenital incomplete segmentation of C2-C3, with interbody and bilateral posterior element fusion. Straightening of upper cervical lordosis. Cervicothoracic junction alignment is within normal limits. Bilateral posterior element alignment is within normal limits. There is also bilateral posterior element ankylosis at C6-C7. Advanced chronic disc and endplate degeneration at C5-C6. Right occipital condyles -C1-C2 joint space loss with degenerative subchondral sclerosis, cysts, and spurring. No acute cervical spine fracture. Visualized upper thoracic levels appear grossly intact, with T1-T2 interbody and posterior element ankylosis. Negative lung apices. Calcified carotid artery atherosclerosis in the neck. Otherwise negative noncontrast paraspinal soft tissues. IMPRESSION: 1. Left scalp hematoma and laceration without underlying fracture. 2. Evidence of chronic white matter small vessel disease. No acute intracranial abnormality. 3. No acute fracture or listhesis identified in the cervical spine. Ligamentous injury is not excluded. 4. Intermittent cervical and upper thoracic spine ankylosis. Electronically Signed   By: Odessa Fleming M.D.   On: 04/06/2016 15:08   Ct Cervical Spine Wo Contrast  04/06/2016  CLINICAL DATA:  70 year old male who fell in bathroom today. Head laceration, on anti coagulation. Syncopal episode. Pain. Initial encounter. EXAM: CT HEAD WITHOUT CONTRAST CT CERVICAL SPINE WITHOUT CONTRAST TECHNIQUE: Multidetector CT imaging of the head and cervical spine was performed following the standard protocol without intravenous contrast. Multiplanar CT image reconstructions of the cervical spine were also generated. COMPARISON:  Paranasal sinus CT 09/17/2013. FINDINGS: CT HEAD FINDINGS Well pneumatized paranasal sinuses and mastoids, probable small left maxillary mucous retention cyst. Broad-based left anterior scalp laceration with hematoma measuring up to 11 mm in thickness. Trace subcutaneous gas.  Underlying left frontal bone is intact. Visualized orbit soft tissues are within normal limits. Other scalp soft tissues are within normal limits. No calvarium fracture. Calcified atherosclerosis at the skull base. Tortuosity and mild dolichoectasia at the vertebrobasilar junction with prominent calcified plaque. No acute intracranial hemorrhage identified. No midline shift, mass effect, or evidence of intracranial mass lesion. No ventriculomegaly. No cortically based acute infarct identified. Small areas of cerebral white matter hypodensity, including involvement of the right external and internal capsules suggesting chronic lacunar infarcts. No suspicious intracranial vascular hyperdensity. CT CERVICAL SPINE FINDINGS Visualized skull base is intact. No atlanto-occipital dissociation. 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 Chronic ankylosis versus congenital incomplete segmentation of C2-C3, with interbody and bilateral posterior element fusion. Straightening of upper cervical lordosis. Cervicothoracic junction alignment is within normal limits. Bilateral posterior element alignment is within normal limits. There is also bilateral posterior element ankylosis at C6-C7. Advanced chronic disc and endplate degeneration at C5-C6. Right occipital condyles -C1-C2 joint space loss with degenerative subchondral sclerosis, cysts, and spurring. No acute cervical spine fracture. Visualized upper thoracic levels appear grossly intact, with T1-T2 interbody and posterior element ankylosis. Negative lung apices. Calcified carotid  artery atherosclerosis in the neck. Otherwise negative noncontrast paraspinal soft tissues. IMPRESSION: 1. Left scalp hematoma and laceration without underlying fracture. 2. Evidence of chronic white matter small vessel disease. No acute intracranial abnormality. 3. No acute fracture or listhesis identified in the cervical spine. Ligamentous injury is not excluded. 4. Intermittent cervical  and upper thoracic spine ankylosis. Electronically Signed   By: Odessa Fleming M.D.   On: 04/06/2016 15:08   I have personally reviewed and evaluated these images and lab results as part of my medical decision-making.   EKG Interpretation   Date/Time:  Wednesday April 06 2016 13:56:51 EDT Ventricular Rate:  71 PR Interval:    QRS Duration: 67 QT Interval:  250 QTC Calculation: 271 R Axis:   13 Text Interpretation:  Accelerated junctional rhythm Abnormal R-wave  progression, early transition ST elevation suggests acute pericarditis  Baseline wander in lead(s) I II aVR aVL aVF No old tracing to compare  Confirmed by [facility]  MD, Raguel Kosloski [phone]) on 04/06/2016 4:55:54 PM      MDM   Final diagnoses:  Head injury, initial encounter  Laceration of scalp, initial encounter  Dizziness  Malaise    Fall preceded by dizziness. Head injury, with laceration, repaired. Dizziness evaluated with screening labs, and IV fluids given as a symptomatic treatment. CT head and cervical spine negative for acute abnormalities. Patient is on Plavix.    Nursing Notes Reviewed/ Care Coordinated, and agree without changes. Applicable Imaging Reviewed.  Interpretation of Laboratory Data incorporated into ED treatment  Plan- as per Dr. Preston Fleeting after evaluation is complete    Mancel Bale, MD 04/07/16 [phone]

## 2016-04-06 NOTE — ED Notes (Signed)
Pt had orthostatic drop. Denies dizziness or lightheadedness.

## 2016-04-06 NOTE — H&P (Signed)
History and Physical    Frank Phillips ZOX:[id number] DOB: [birth year] DOA: 04/06/2016  Referring MD/NP/PA: Dr. Preston Fleeting   PCP: Hadley Pen, MD   Patient coming from: Home   Chief Complaint: Fall and head injury   HPI: Frank Phillips is a 70 y.o. male with medical history of HTN, DM, presented to [facility] ED for evaluation of head injury after an episode of fall at home that occurred while he was running in the hallway trying to get something near the bathroom, trying to get ready to go to the beach. He says he thinks he slipped and fell and injuring his head. He denies any specific symptoms prior to the event, no dizziness, no chest pain or dyspnea, no lightheadedness, no visual changes. No similar events in the past.   ED Course: In ED, pt noted to be hemodynamically stable, VSS, blood work pending. Pt noted to be orthostatic and TRH asked to admit for further evaluation.   Review of Systems:  Constitutional: Negative for fever, chills, diaphoresis, activity change, appetite change and fatigue.  HENT: Negative for ear pain, nosebleeds, congestion, facial swelling, rhinorrhea, neck pain, neck stiffness and ear discharge.   Eyes: Negative for pain, discharge, redness, itching and visual disturbance.  Respiratory: Negative for cough, choking, chest tightness, shortness of breath, wheezing and stridor.   Cardiovascular: Negative for chest pain, palpitations Gastrointestinal: Negative for abdominal distention.  Genitourinary: Negative for dysuria, urgency, frequency, hematuria, flank pain, Musculoskeletal: Negative for back pain, joint swelling, arthralgias and gait problem.  Neurological: Negative for dizziness, tremors, seizures Hematological: Negative for adenopathy. Does not bruise/bleed easily.  Psychiatric/Behavioral: Negative for hallucinations, behavioral problems  Past Medical History  Diagnosis Date  . High blood pressure   . Diabetes (HCC)   . High cholesterol   . Stroke (HCC)   .  Kidney stones   . Arthritis   . Depression   . Anxiety   . Crohn disease [facility])     Past Surgical History  Procedure Laterality Date  . Appendectomy    . Angioplasty / stenting femoral    . Kidney stones    . Tonsillectomy and adenoidectomy       reports that he has never smoked. He does not have any smokeless tobacco history on file. He reports that he does not drink alcohol or use illicit drugs.  Allergies  Allergen Reactions  . Levaquin [Levofloxacin]     Out of mind    Family History  Problem Relation Age of Onset  . Cancer Sister     skin    Prior to Admission medications   Medication Sig Start Date End Date Taking? Authorizing Provider  albuterol (PROAIR HFA) 108 (90 BASE) MCG/ACT inhaler Inhale 2 puffs into the lungs every 6 (six) hours as needed.    Yes Historical Provider, MD  alfuzosin (UROXATRAL) 10 MG 24 hr tablet Take 1 tablet (10 mg total) by mouth daily with breakfast. 10/01/13  Yes Nyoka Cowden, MD  APRISO 0.375 g 24 hr capsule Take 0.375 g by mouth daily as needed. pain 03/29/16  Yes Historical Provider, MD  beclomethasone (QVAR) 40 MCG/ACT inhaler Take 2 puffs first thing in am and then another 2 puffs about 12 hours later. Patient not taking: Reported on 04/06/2016 12/04/13   Nyoka Cowden, MD  buPROPion [employer] SR) 150 MG 12 hr tablet Take 150 mg by mouth daily. 03/22/16  Yes Historical Provider, MD  BUSPIRONE HCL PO Take 1 tablet by mouth daily.  Yes Historical Provider, MD  Cholecalciferol (VITAMIN D-3) 1000 UNITS CAPS Take 1 capsule by mouth daily.   Yes Historical Provider, MD  clopidogrel (PLAVIX) 75 MG tablet Take 75 mg by mouth daily with breakfast.   Yes Historical Provider, MD  colesevelam (WELCHOL) 625 MG tablet Take 625 mg by mouth 2 (two) times daily. Wife states he is only taking twice daily   Yes Historical Provider, MD  donepezil (ARICEPT) 10 MG tablet Take 10 mg by mouth at bedtime.   Yes Historical Provider, MD  metFORMIN (GLUCOPHAGE)  1000 MG tablet Take 1,000 mg by mouth 2 (two) times daily with a meal.   Yes Historical Provider, MD  naphazoline-pheniramine (NAPHCON-A) 0.025-0.3 % ophthalmic solution Place 1 drop into both eyes 4 (four) times daily as needed for irritation.   Yes Historical Provider, MD  nebivolol (BYSTOLIC) 5 MG tablet Take 1 tablet (5 mg total) by mouth daily. Patient not taking: Reported on 04/06/2016 09/17/13   Nyoka Cowden, MD  omeprazole (PRILOSEC) 20 MG capsule 2 capsules by mouth every morning before breakfast   Yes Historical Provider, MD  PARoxetine (PAXIL) 40 MG tablet Take 40 mg by mouth every morning.   Yes Historical Provider, MD  zolpidem (AMBIEN) 10 MG tablet Take 10 mg by mouth at bedtime as needed for sleep.   Yes Historical Provider, MD    Physical Exam: Filed Vitals:   04/06/16 1730 04/06/16 1745 04/06/16 1800 04/06/16 1815  BP: 135/65 130/78 133/69 125/73  Pulse: 76 58 77 90  Temp:      TempSrc:      Resp: 20 21 22 18   SpO2: 97% 97% 95% 95%    Constitutional: NAD, calm, comfortable Filed Vitals:   04/06/16 1730 04/06/16 1745 04/06/16 1800 04/06/16 1815  BP: 135/65 130/78 133/69 125/73  Pulse: 76 58 77 90  Temp:      TempSrc:      Resp: 20 21 22 18   SpO2: 97% 97% 95% 95%   Eyes: PERRL, lids and conjunctivae normal ENMT: Mucous membranes are moist. Posterior pharynx clear of any exudate or lesions.Normal dentition.  Neck: normal, supple, no masses, no thyromegaly Respiratory: clear to auscultation bilaterally, no wheezing, no crackles. Normal respiratory effort. No accessory muscle use.  Cardiovascular: Regular rate and rhythm, no murmurs / rubs / gallops. No extremity edema. 2+ pedal pulses. No carotid bruits.  Abdomen: no tenderness, no masses palpated. No hepatosplenomegaly. Bowel sounds positive.  Musculoskeletal: no clubbing / cyanosis. No joint deformity upper and lower extremities.  Skin: no rashes, lesions, ulcers. No induration Neurologic: CN 2-12 grossly intact.  Sensation intact, DTR normal. Strength 5/5 in all 4.  Psychiatric: Normal judgment and insight. Alert and oriented x 3. Normal mood.   Labs on Admission: I have personally reviewed following labs and imaging studies  CBC - pending  Basic Metabolic Panel - pending  GFR: CrCl cannot be calculated (Unknown ideal weight.). Liver Function Tests: No results for input(s): AST, ALT, ALKPHOS, BILITOT, PROT, ALBUMIN in the last 168 hours. No results for input(s): LIPASE, AMYLASE in the last 168 hours. No results for input(s): AMMONIA in the last 168 hours. Coagulation Profile: No results for input(s): INR, PROTIME in the last 168 hours. Cardiac Enzymes: No results for input(s): CKTOTAL, CKMB, CKMBINDEX, TROPONINI in the last 168 hours. BNP (last 3 results) No results for input(s): PROBNP in the last 8760 hours. HbA1C: No results for input(s): HGBA1C in the last 72 hours. CBG: No results for input(s): GLUCAP  in the last 168 hours. Lipid Profile: No results for input(s): CHOL, HDL, LDLCALC, TRIG, CHOLHDL, LDLDIRECT in the last 72 hours. Thyroid Function Tests: No results for input(s): TSH, T4TOTAL, FREET4, T3FREE, THYROIDAB in the last 72 hours. Anemia Panel: No results for input(s): VITAMINB12, FOLATE, FERRITIN, TIBC, IRON, RETICCTPCT in the last 72 hours. Urine analysis: No results found for: COLORURINE, APPEARANCEUR, LABSPEC, PHURINE, GLUCOSEU, HGBUR, BILIRUBINUR, KETONESUR, PROTEINUR, UROBILINOGEN, NITRITE, LEUKOCYTESUR Sepsis Labs: @LABRCNTIP (procalcitonin:4,lacticidven:4) )No results found for this or any previous visit (from the past 240 hour(s)).   Radiological Exams on Admission: Ct Head Wo Contrast  04/06/2016  CLINICAL DATA:  70 year old male who fell in bathroom today. Head laceration, on anti coagulation. Syncopal episode. Pain. Initial encounter. EXAM: CT HEAD WITHOUT CONTRAST CT CERVICAL SPINE WITHOUT CONTRAST TECHNIQUE: Multidetector CT imaging of the head and cervical spine  was performed following the standard protocol without intravenous contrast. Multiplanar CT image reconstructions of the cervical spine were also generated. COMPARISON:  Paranasal sinus CT 09/17/2013. FINDINGS: CT HEAD FINDINGS Well pneumatized paranasal sinuses and mastoids, probable small left maxillary mucous retention cyst. Broad-based left anterior scalp laceration with hematoma measuring up to 11 mm in thickness. Trace subcutaneous gas. Underlying left frontal bone is intact. Visualized orbit soft tissues are within normal limits. Other scalp soft tissues are within normal limits. No calvarium fracture. Calcified atherosclerosis at the skull base. Tortuosity and mild dolichoectasia at the vertebrobasilar junction with prominent calcified plaque. No acute intracranial hemorrhage identified. No midline shift, mass effect, or evidence of intracranial mass lesion. No ventriculomegaly. No cortically based acute infarct identified. Small areas of cerebral white matter hypodensity, including involvement of the right external and internal capsules suggesting chronic lacunar infarcts. No suspicious intracranial vascular hyperdensity. CT CERVICAL SPINE FINDINGS Visualized skull base is intact. No atlanto-occipital dissociation. 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 Chronic ankylosis versus congenital incomplete segmentation of C2-C3, with interbody and bilateral posterior element fusion. Straightening of upper cervical lordosis. Cervicothoracic junction alignment is within normal limits. Bilateral posterior element alignment is within normal limits. There is also bilateral posterior element ankylosis at C6-C7. Advanced chronic disc and endplate degeneration at C5-C6. Right occipital condyles -C1-C2 joint space loss with degenerative subchondral sclerosis, cysts, and spurring. No acute cervical spine fracture. Visualized upper thoracic levels appear grossly intact, with T1-T2 interbody and posterior  element ankylosis. Negative lung apices. Calcified carotid artery atherosclerosis in the neck. Otherwise negative noncontrast paraspinal soft tissues. IMPRESSION: 1. Left scalp hematoma and laceration without underlying fracture. 2. Evidence of chronic white matter small vessel disease. No acute intracranial abnormality. 3. No acute fracture or listhesis identified in the cervical spine. Ligamentous injury is not excluded. 4. Intermittent cervical and upper thoracic spine ankylosis. Electronically Signed   By: Odessa Fleming M.D.   On: 04/06/2016 15:08   Ct Cervical Spine Wo Contrast 04/06/2016   Left scalp hematoma and laceration without underlying fracture. 2. Evidence of chronic white matter small vessel disease. No acute intracranial abnormality. 3. No acute fracture or listhesis identified in the cervical spine. Ligamentous injury is not excluded. 4. Intermittent cervical and upper thoracic spine ankylosis.    EKG: non specific T wave changes   Assessment/Plan Active Problems: Fall with left scalp hematoma and laceration  - no underlying fracture - appears to be mechanical but here in ED orthostatic hypotension noted so not clear if this is actual inciting event - will admit to tele for observation - ECHO requested - repeat orthostatics in AM - PT eval  requested  Essential HTN - continue home medical regimen   DM  - continue home medical regimen  - add SSI    DVT prophylaxis: SCD's Code Status: Full Family Communication: Pt updated at bedside Disposition Plan: Home in AM Consults called: None Admission status: Observation   Debbora Presto MD [facility] Pager [phone]  If 7PM-7AM, please contact night-coverage [email or web address] Password [facility]  04/06/2016, 6:31 PM

## 2016-04-07 ENCOUNTER — Encounter (HOSPITAL_COMMUNITY): Payer: Self-pay | Admitting: General Practice

## 2016-04-07 ENCOUNTER — Observation Stay (HOSPITAL_BASED_OUTPATIENT_CLINIC_OR_DEPARTMENT_OTHER): Payer: Medicare Other

## 2016-04-07 DIAGNOSIS — R55 Syncope and collapse: Secondary | ICD-10-CM

## 2016-04-07 DIAGNOSIS — S0101XA Laceration without foreign body of scalp, initial encounter: Secondary | ICD-10-CM | POA: Diagnosis not present

## 2016-04-07 DIAGNOSIS — I951 Orthostatic hypotension: Secondary | ICD-10-CM | POA: Diagnosis not present

## 2016-04-07 LAB — ECHOCARDIOGRAM COMPLETE
EWDT: 204 ms
FS: 28 % (ref 28–44)
HEIGHTINCHES: 66 in
IV/PV OW: 1.18
LA vol A4C: 38.9 ml
LADIAMINDEX: 2.22 cm/m2
LASIZE: 41 mm
LAVOL: 46.9 mL
LAVOLIN: 25.4 mL/m2
LEFT ATRIUM END SYS DIAM: 41 mm
LV PW d: 11 mm — AB (ref 0.6–1.1)
LV TDI E'MEDIAL: 5.66
LV e' LATERAL: 7.94 cm/s
LVOT area: 4.15 cm2
LVOTD: 23 mm
MV Dec: 204
MV pk E vel: 0.9 m/s
Reg peak vel: 265 cm/s
TDI e' lateral: 7.94
TRMAXVEL: 265 cm/s
WEIGHTICAEL: 2552 [oz_av]

## 2016-04-07 LAB — FOLATE: Folate: 25.7 ng/mL (ref >5.9)

## 2016-04-07 LAB — CBC
HEMATOCRIT: 31.2 % — AB (ref 39.0–52.0)
HEMOGLOBIN: 10 g/dL — AB (ref 13.0–17.0)
MCH: 26.5 pg (ref 26.0–34.0)
MCHC: 32.1 g/dL (ref 30.0–36.0)
MCV: 82.8 fL (ref 78.0–100.0)
Platelets: 196 10*3/uL (ref 150–400)
RBC: 3.77 MIL/uL — AB (ref 4.22–5.81)
RDW: 13.7 % (ref 11.5–15.5)
WBC: 7.5 10*3/uL (ref 4.0–10.5)

## 2016-04-07 LAB — HEMOGLOBIN A1C
Hgb A1c MFr Bld: 5.7 % — ABNORMAL HIGH (ref 4.8–5.6)
MEAN PLASMA GLUCOSE: 117 mg/dL

## 2016-04-07 LAB — BASIC METABOLIC PANEL
ANION GAP: 9 (ref 5–15)
BUN: 10 mg/dL (ref 6–20)
CHLORIDE: 106 mmol/L (ref 101–111)
CO2: 25 mmol/L (ref 22–32)
Calcium: 9.2 mg/dL (ref 8.9–10.3)
Creatinine, Ser: 0.94 mg/dL (ref 0.61–1.24)
Glucose, Bld: 124 mg/dL — ABNORMAL HIGH (ref 65–99)
POTASSIUM: 3.6 mmol/L (ref 3.5–5.1)
SODIUM: 140 mmol/L (ref 135–145)

## 2016-04-07 LAB — VITAMIN B12: VITAMIN B 12: 430 pg/mL (ref 180–914)

## 2016-04-07 LAB — CORTISOL: CORTISOL PLASMA: 10.2 ug/dL

## 2016-04-07 LAB — GLUCOSE, CAPILLARY
GLUCOSE-CAPILLARY: 156 mg/dL — AB (ref 65–99)
Glucose-Capillary: 184 mg/dL — ABNORMAL HIGH (ref 65–99)

## 2016-04-07 MED ORDER — MIDODRINE HCL 5 MG PO TABS: 5.0000 mg | ORAL_TABLET | Freq: Two times a day (BID) | ORAL | Status: DC

## 2016-04-07 MED ORDER — MIDODRINE HCL 5 MG PO TABS
5.0000 mg | ORAL_TABLET | Freq: Two times a day (BID) | ORAL | Status: DC
  Administered 2016-04-07 (×2): 5 mg via ORAL
  Filled 2016-04-07 (×2): qty 1

## 2016-04-07 MED ORDER — SODIUM CHLORIDE 0.9 % IV SOLN
INTRAVENOUS | Status: DC
  Administered 2016-04-07: 100 mL/h via INTRAVENOUS

## 2016-04-07 MED ORDER — ZOLPIDEM TARTRATE 5 MG PO TABS: 5.0000 mg | ORAL_TABLET | Freq: Every evening | ORAL | Status: DC | PRN

## 2016-04-07 NOTE — Progress Notes (Signed)
[facility]                                                                              Patient Demographics  Frank Phillips, is a 70 y.o. male, DOB - 03/06/1946, OTL:[id number]  Admit date - 04/06/2016   Admitting Physician Frank Blaze, MD  Outpatient Primary MD for the patient is Frank Broker, MD  Outpatient specialists:   LOS -   days    Chief Complaint  Patient presents with  . Fall       Brief summary   Frank Phillips is a 70 y.o. male with medical history of HTN, DM, presented to [facility] ED for evaluation of head injury after an episode of fall at home that occurred while he was running in the hallway trying to get something near the bathroom, trying to get ready to go to the beach. Patient reported that he slipped and fell and injuring his head. He denied any specific symptoms prior to the event, no dizziness, no chest pain or dyspnea, no lightheadedness, no visual changes. No similar events in the past. He was orthostatic in ED.   Assessment & Plan    Active Problems:   Orthostatic hypotension, ?Syncopal episodes, Autonomic dysfunction, per patient has been dizzy for a month - Orthostatic vitals positive this morning, UA positive for ketones - Continue IV fluid hydration, 2-D echo pending - TSH, cortisol, B12, folate within normal limits - CT head showed no acute stroke - PTOT evaluation - Started on midodrine 5 mg twice a day, thigh-high TED hoses  Fall with left scalp hematoma and laceration  - Fall precautions,  Essential HTN - continue home medical regimen   DM  - continue home medical regimen  - add SSI   Code Status: Full code  DVT Prophylaxis:  SCD's   Family Communication: Discussed in detail with the patient, all imaging results, lab results explained to the patient  Disposition Plan:  Time Spent in minutes   25 minutes  Procedures:  CT head  Consultants:     Antimicrobials:      Medications  Scheduled Meds: .  alfuzosin  10 mg Oral Q breakfast  . buPROPion  150 mg Oral Daily  . busPIRone  5 mg Oral Daily  . cholecalciferol  1,000 Units Oral Daily  . clopidogrel  75 mg Oral Q breakfast  . colesevelam  625 mg Oral BID  . donepezil  10 mg Oral QHS  . insulin aspart  0-9 Units Subcutaneous TID WC  . mesalamine  2.4 g Oral Q breakfast  . metFORMIN  1,000 mg Oral BID WC  . midodrine  5 mg Oral BID WC  . pantoprazole  40 mg Oral Daily  . PARoxetine  40 mg Oral Daily  . sodium chloride flush  3 mL Intravenous Q12H   Continuous Infusions: . sodium chloride 100 mL/hr (04/07/16 0818)   PRN Meds:.acetaminophen, albuterol, naphazoline-pheniramine, zolpidem   Antibiotics   Anti-infectives    None        Subjective:   Frank Phillips was seen and examined today. He feels a lot better this morning. Still  orthostatic on vitals. Patient denies dizziness, chest pain, shortness of breath, abdominal pain, N/V/D/C, new weakness, numbess, tingling. No acute events overnight.    Objective:   Filed Vitals:   04/06/16 1849 04/06/16 1952 04/07/16 0040 04/07/16 0528  BP: 136/67 144/64 144/59 145/69  Pulse: 84 83 64 64  Temp:  98.7 F (37.1 C) 99 F (37.2 C) 98.3 F (36.8 C)  TempSrc:  Oral Oral Oral  Resp: 20     Height:    5' 6"  (1.676 m)  Weight:    72.349 kg (159 lb 8 oz)  SpO2: 99% 96% 99% 99%    Intake/Output Summary (Last 24 hours) at 04/07/16 0958 Last data filed at 04/07/16 0530  Gross per 24 hour  Intake    240 ml  Output    425 ml  Net   -185 ml     Wt Readings from Last 3 Encounters:  04/07/16 72.349 kg (159 lb 8 oz)  12/04/13 84.369 kg (186 lb)  11/21/13 83.915 kg (185 lb)     Exam  General: Alert and oriented x 3, NAD  HEENT:  PERRLA, EOMI, Anicteric Sclera, mucous membranes moist.   Neck: Supple, no JVD, no masses  Cardiovascular: S1 S2 auscultated, no rubs, murmurs or gallops. Regular rate and rhythm.  Respiratory: Clear to auscultation bilaterally, no wheezing,  rales or rhonchi  Gastrointestinal: Soft, nontender, nondistended, + bowel sounds  Ext: no cyanosis clubbing or edema  Neuro: AAOx3, Cr N's II- XII. Strength 5/5 upper and lower extremities bilaterally  Skin: No rashes  Psych: Normal affect and demeanor, alert and oriented x3    Data Reviewed:  I have personally reviewed following labs and imaging studies  Micro Results No results found for this or any previous visit (from the past 240 hour(s)).  Radiology Reports Ct Head Wo Contrast  04/06/2016  CLINICAL DATA:  70 year old male who fell in bathroom today. Head laceration, on anti coagulation. Syncopal episode. Pain. Initial encounter. EXAM: CT HEAD WITHOUT CONTRAST CT CERVICAL SPINE WITHOUT CONTRAST TECHNIQUE: Multidetector CT imaging of the head and cervical spine was performed following the standard protocol without intravenous contrast. Multiplanar CT image reconstructions of the cervical spine were also generated. COMPARISON:  Paranasal sinus CT 09/17/2013. FINDINGS: CT HEAD FINDINGS Well pneumatized paranasal sinuses and mastoids, probable small left maxillary mucous retention cyst. Broad-based left anterior scalp laceration with hematoma measuring up to 11 mm in thickness. Trace subcutaneous gas. Underlying left frontal bone is intact. Visualized orbit soft tissues are within normal limits. Other scalp soft tissues are within normal limits. No calvarium fracture. Calcified atherosclerosis at the skull base. Tortuosity and mild dolichoectasia at the vertebrobasilar junction with prominent calcified plaque. No acute intracranial hemorrhage identified. No midline shift, mass effect, or evidence of intracranial mass lesion. No ventriculomegaly. No cortically based acute infarct identified. Small areas of cerebral white matter hypodensity, including involvement of the right external and internal capsules suggesting chronic lacunar infarcts. No suspicious intracranial vascular hyperdensity. CT  CERVICAL SPINE FINDINGS Visualized skull base is intact. No atlanto-occipital dissociation. 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 Chronic ankylosis versus congenital incomplete segmentation of C2-C3, with interbody and bilateral posterior element fusion. Straightening of upper cervical lordosis. Cervicothoracic junction alignment is within normal limits. Bilateral posterior element alignment is within normal limits. There is also bilateral posterior element ankylosis at C6-C7. Advanced chronic disc and endplate degeneration at C5-C6. Right occipital condyles -C1-C2 joint space loss with degenerative subchondral sclerosis, cysts, and spurring. No acute  cervical spine fracture. Visualized upper thoracic levels appear grossly intact, with T1-T2 interbody and posterior element ankylosis. Negative lung apices. Calcified carotid artery atherosclerosis in the neck. Otherwise negative noncontrast paraspinal soft tissues. IMPRESSION: 1. Left scalp hematoma and laceration without underlying fracture. 2. Evidence of chronic white matter small vessel disease. No acute intracranial abnormality. 3. No acute fracture or listhesis identified in the cervical spine. Ligamentous injury is not excluded. 4. Intermittent cervical and upper thoracic spine ankylosis. Electronically Signed   By: Genevie Ann M.D.   On: 04/06/2016 15:08   Ct Cervical Spine Wo Contrast  04/06/2016  CLINICAL DATA:  70 year old male who fell in bathroom today. Head laceration, on anti coagulation. Syncopal episode. Pain. Initial encounter. EXAM: CT HEAD WITHOUT CONTRAST CT CERVICAL SPINE WITHOUT CONTRAST TECHNIQUE: Multidetector CT imaging of the head and cervical spine was performed following the standard protocol without intravenous contrast. Multiplanar CT image reconstructions of the cervical spine were also generated. COMPARISON:  Paranasal sinus CT 09/17/2013. FINDINGS: CT HEAD FINDINGS Well pneumatized paranasal sinuses and mastoids,  probable small left maxillary mucous retention cyst. Broad-based left anterior scalp laceration with hematoma measuring up to 11 mm in thickness. Trace subcutaneous gas. Underlying left frontal bone is intact. Visualized orbit soft tissues are within normal limits. Other scalp soft tissues are within normal limits. No calvarium fracture. Calcified atherosclerosis at the skull base. Tortuosity and mild dolichoectasia at the vertebrobasilar junction with prominent calcified plaque. No acute intracranial hemorrhage identified. No midline shift, mass effect, or evidence of intracranial mass lesion. No ventriculomegaly. No cortically based acute infarct identified. Small areas of cerebral white matter hypodensity, including involvement of the right external and internal capsules suggesting chronic lacunar infarcts. No suspicious intracranial vascular hyperdensity. CT CERVICAL SPINE FINDINGS Visualized skull base is intact. No atlanto-occipital dissociation. 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 Chronic ankylosis versus congenital incomplete segmentation of C2-C3, with interbody and bilateral posterior element fusion. Straightening of upper cervical lordosis. Cervicothoracic junction alignment is within normal limits. Bilateral posterior element alignment is within normal limits. There is also bilateral posterior element ankylosis at C6-C7. Advanced chronic disc and endplate degeneration at C5-C6. Right occipital condyles -C1-C2 joint space loss with degenerative subchondral sclerosis, cysts, and spurring. No acute cervical spine fracture. Visualized upper thoracic levels appear grossly intact, with T1-T2 interbody and posterior element ankylosis. Negative lung apices. Calcified carotid artery atherosclerosis in the neck. Otherwise negative noncontrast paraspinal soft tissues. IMPRESSION: 1. Left scalp hematoma and laceration without underlying fracture. 2. Evidence of chronic white matter small vessel  disease. No acute intracranial abnormality. 3. No acute fracture or listhesis identified in the cervical spine. Ligamentous injury is not excluded. 4. Intermittent cervical and upper thoracic spine ankylosis. Electronically Signed   By: Genevie Ann M.D.   On: 04/06/2016 15:08    Lab Data:  CBC:  Recent Labs Lab 04/06/16 1705 04/07/16 0450  WBC 9.2 7.5  NEUTROABS 7.5  --   HGB 11.0* 10.0*  HCT 34.0* 31.2*  MCV 82.9 82.8  PLT 207 093   Basic Metabolic Panel:  Recent Labs Lab 04/06/16 1705 04/07/16 0450  NA 137 140  K 3.5 3.6  CL 103 106  CO2 26 25  GLUCOSE 156* 124*  BUN 8 10  CREATININE 0.91 0.94  CALCIUM 9.4 9.2   GFR: Estimated Creatinine Clearance: 65 mL/min (by C-G formula based on Cr of 0.94). Liver Function Tests: No results for input(s): AST, ALT, ALKPHOS, BILITOT, PROT, ALBUMIN in the last 168 hours.  No results for input(s): LIPASE, AMYLASE in the last 168 hours. No results for input(s): AMMONIA in the last 168 hours. Coagulation Profile: No results for input(s): INR, PROTIME in the last 168 hours. Cardiac Enzymes: No results for input(s): CKTOTAL, CKMB, CKMBINDEX, TROPONINI in the last 168 hours. BNP (last 3 results) No results for input(s): PROBNP in the last 8760 hours. HbA1C:  Recent Labs  04/06/16 1911  HGBA1C 5.7*   CBG:  Recent Labs Lab 04/07/16 0805  GLUCAP 156*   Lipid Profile: No results for input(s): CHOL, HDL, LDLCALC, TRIG, CHOLHDL, LDLDIRECT in the last 72 hours. Thyroid Function Tests:  Recent Labs  04/06/16 1911  TSH 1.464   Anemia Panel:  Recent Labs  04/07/16 0758  VITAMINB12 430  FOLATE 25.7   Urine analysis:    Component Value Date/Time   COLORURINE AMBER* 04/06/2016 2102   APPEARANCEUR CLOUDY* 04/06/2016 2102   LABSPEC 1.024 04/06/2016 2102   PHURINE 6.0 04/06/2016 2102   GLUCOSEU 100* 04/06/2016 2102   HGBUR NEGATIVE 04/06/2016 2102   BILIRUBINUR SMALL* 04/06/2016 2102   KETONESUR 15* 04/06/2016 2102    PROTEINUR NEGATIVE 04/06/2016 2102   NITRITE NEGATIVE 04/06/2016 2102   LEUKOCYTESUR SMALL* 04/06/2016 2102     Corinne Goucher M.D. [facility] 04/07/2016, 9:58 AM  Pager: [phone] Between 7am to 7pm - call Pager - [phone]  After 7pm go to [email or web address] - password [email or web address]  Call night coverage person covering after 7pm

## 2016-04-07 NOTE — Progress Notes (Signed)
Reviewed discharge instructions with patient's wife per patient's request.  She stated her understanding.  Reviewed with patient and wife to take time changing positions, not to go immediately from lying, sitting to standing.  They stated their understanding.  Discharged home with wife via wheelchair.  Colman Cater

## 2016-04-07 NOTE — Discharge Summary (Signed)
Physician Discharge Summary   Patient ID: Frank Phillips MRN: [id number] DOB/AGE: Aug 14, 1946 70 y.o.  Admit date: 04/06/2016 Discharge date: 04/07/2016  Primary Care Physician:  Myrlene Broker, MD  Discharge Diagnoses:     . Orthostatic hypotension . Syncopal episodes   Diabetes    hypertension  Consults: None   Recommendations for Outpatient Follow-up:   1. Please repeat CBC/BMET at next visit 2. Patient started on midodrine 59m BID.    DIET: carb modified     Allergies:   Allergies  Allergen Reactions  . Levaquin [Levofloxacin]     Out of mind     DISCHARGE MEDICATIONS: Current Discharge Medication List    START taking these medications   Details  midodrine (PROAMATINE) 5 MG tablet Take 1 tablet (5 mg total) by mouth 2 (two) times daily with a meal. Qty: 60 tablet, Refills: 3      CONTINUE these medications which have CHANGED   Details  zolpidem (AMBIEN) 5 MG tablet Take 1 tablet (5 mg total) by mouth at bedtime as needed for sleep. Qty: 20 tablet, Refills: 0      CONTINUE these medications which have NOT CHANGED   Details  albuterol (PROAIR HFA) 108 (90 BASE) MCG/ACT inhaler Inhale 2 puffs into the lungs every 6 (six) hours as needed.     alfuzosin (UROXATRAL) 10 MG 24 hr tablet Take 1 tablet (10 mg total) by mouth daily with breakfast.    APRISO 0.375 g 24 hr capsule Take 0.375 g by mouth daily as needed. pain    beclomethasone (QVAR) 40 MCG/ACT inhaler Take 2 puffs first thing in am and then another 2 puffs about 12 hours later. Qty: 1 Inhaler, Refills: 11    buPROPion (WELLBUTRIN SR) 150 MG 12 hr tablet Take 150 mg by mouth daily.    BUSPIRONE HCL PO Take 1 tablet by mouth daily.    Cholecalciferol (VITAMIN D-3) 1000 UNITS CAPS Take 1 capsule by mouth daily.    clopidogrel (PLAVIX) 75 MG tablet Take 75 mg by mouth daily with breakfast.    colesevelam (WELCHOL) 625 MG tablet Take 625 mg by mouth 2 (two) times daily. Wife states he is only taking  twice daily    donepezil (ARICEPT) 10 MG tablet Take 10 mg by mouth at bedtime.    metFORMIN (GLUCOPHAGE) 1000 MG tablet Take 1,000 mg by mouth 2 (two) times daily with a meal.    naphazoline-pheniramine (NAPHCON-A) 0.025-0.3 % ophthalmic solution Place 1 drop into both eyes 4 (four) times daily as needed for irritation.    omeprazole (PRILOSEC) 20 MG capsule 2 capsules by mouth every morning before breakfast    PARoxetine (PAXIL) 40 MG tablet Take 40 mg by mouth every morning.      STOP taking these medications     nebivolol (BYSTOLIC) 5 MG tablet          Brief H and P: For complete details please refer to admission H and P, but in briefGary L Phillips is a 70y.o. male with medical history of HTN, DM, [facility] after an episode of fall at home that occurred while he was running in the hallway trying to get something near the bathroom, trying to get ready to go to the beach. Patient reported that he slipped and fell and injuring his head. He denied any specific symptoms prior to the event, no dizziness, no chest pain or dyspnea, no lightheadedness, no visual changes. No  similar events in the past. He was orthostatic in ED.   Hospital Course:  Orthostatic hypotension, ?Syncopal episodes, Autonomic dysfunction, per patient has been dizzy for a month - Orthostatic vitals positive this morning, UA positive for ketones - Continue IV fluid hydration, 2-D echo EF 49%-82%, grade 2 diastolic dysfunction - TSH, cortisol, B12, folate within normal limits - CT head showed no acute stroke - PTOT evaluation recommended no PT follow-up - Started on midodrine 5 mg twice a day, thigh-high TED hoses  Fall with left scalp hematoma and laceration  - Fall precautions,  Essential HTN - continue home medical regimen   DM  - continue home medical regimen  - add SSI    Day of Discharge BP 145/69 mmHg  Pulse 69  Temp(Src) 99.2 F (37.3 C) (Oral)  Resp 20   Ht 5' 6"  (1.676 m)  Wt 72.349 kg (159 lb 8 oz)  BMI 25.76 kg/m2  SpO2 99%  Physical Exam: General: Alert and awake oriented x3 not in any acute distress. HEENT: anicteric sclera, pupils reactive to light and accommodation CVS: S1-S2 clear no murmur rubs or gallops Chest: clear to auscultation bilaterally, no wheezing rales or rhonchi Abdomen: soft nontender, nondistended, normal bowel sounds Extremities: no cyanosis, clubbing or edema noted bilaterally Neuro: Cranial nerves II-XII intact, no focal neurological deficits   The results of significant diagnostics from this hospitalization (including imaging, microbiology, ancillary and laboratory) are listed below for reference.    LAB RESULTS: Basic Metabolic Panel:  Recent Labs Lab 04/06/16 1705 04/07/16 0450  NA 137 140  K 3.5 3.6  CL 103 106  CO2 26 25  GLUCOSE 156* 124*  BUN 8 10  CREATININE 0.91 0.94  CALCIUM 9.4 9.2   Liver Function Tests: No results for input(s): AST, ALT, ALKPHOS, BILITOT, PROT, ALBUMIN in the last 168 hours. No results for input(s): LIPASE, AMYLASE in the last 168 hours. No results for input(s): AMMONIA in the last 168 hours. CBC:  Recent Labs Lab 04/06/16 1705 04/07/16 0450  WBC 9.2 7.5  NEUTROABS 7.5  --   HGB 11.0* 10.0*  HCT 34.0* 31.2*  MCV 82.9 82.8  PLT 207 196   Cardiac Enzymes: No results for input(s): CKTOTAL, CKMB, CKMBINDEX, TROPONINI in the last 168 hours. BNP: Invalid input(s): POCBNP CBG:  Recent Labs Lab 04/07/16 0805 04/07/16 1120  GLUCAP 156* 184*    Significant Diagnostic Studies:  Ct Head Wo Contrast  04/06/2016  CLINICAL DATA:  70 year old male who fell in bathroom today. Head laceration, on anti coagulation. Syncopal episode. Pain. Initial encounter. EXAM: CT HEAD WITHOUT CONTRAST CT CERVICAL SPINE WITHOUT CONTRAST TECHNIQUE: Multidetector CT imaging of the head and cervical spine was performed following the standard protocol without intravenous contrast.  Multiplanar CT image reconstructions of the cervical spine were also generated. COMPARISON:  Paranasal sinus CT 09/17/2013. FINDINGS: CT HEAD FINDINGS Well pneumatized paranasal sinuses and mastoids, probable small left maxillary mucous retention cyst. Broad-based left anterior scalp laceration with hematoma measuring up to 11 mm in thickness. Trace subcutaneous gas. Underlying left frontal bone is intact. Visualized orbit soft tissues are within normal limits. Other scalp soft tissues are within normal limits. No calvarium fracture. Calcified atherosclerosis at the skull base. Tortuosity and mild dolichoectasia at the vertebrobasilar junction with prominent calcified plaque. No acute intracranial hemorrhage identified. No midline shift, mass effect, or evidence of intracranial mass lesion. No ventriculomegaly. No cortically based acute infarct identified. Small areas of cerebral white matter hypodensity, including involvement of the  right external and internal capsules suggesting chronic lacunar infarcts. No suspicious intracranial vascular hyperdensity. CT CERVICAL SPINE FINDINGS Visualized skull base is intact. No atlanto-occipital dissociation. 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 Chronic ankylosis versus congenital incomplete segmentation of C2-C3, with interbody and bilateral posterior element fusion. Straightening of upper cervical lordosis. Cervicothoracic junction alignment is within normal limits. Bilateral posterior element alignment is within normal limits. There is also bilateral posterior element ankylosis at C6-C7. Advanced chronic disc and endplate degeneration at C5-C6. Right occipital condyles -C1-C2 joint space loss with degenerative subchondral sclerosis, cysts, and spurring. No acute cervical spine fracture. Visualized upper thoracic levels appear grossly intact, with T1-T2 interbody and posterior element ankylosis. Negative lung apices. Calcified carotid artery atherosclerosis  in the neck. Otherwise negative noncontrast paraspinal soft tissues. IMPRESSION: 1. Left scalp hematoma and laceration without underlying fracture. 2. Evidence of chronic white matter small vessel disease. No acute intracranial abnormality. 3. No acute fracture or listhesis identified in the cervical spine. Ligamentous injury is not excluded. 4. Intermittent cervical and upper thoracic spine ankylosis. Electronically Signed   By: Genevie Ann M.D.   On: 04/06/2016 15:08   Ct Cervical Spine Wo Contrast  04/06/2016  CLINICAL DATA:  70 year old male who fell in bathroom today. Head laceration, on anti coagulation. Syncopal episode. Pain. Initial encounter. EXAM: CT HEAD WITHOUT CONTRAST CT CERVICAL SPINE WITHOUT CONTRAST TECHNIQUE: Multidetector CT imaging of the head and cervical spine was performed following the standard protocol without intravenous contrast. Multiplanar CT image reconstructions of the cervical spine were also generated. COMPARISON:  Paranasal sinus CT 09/17/2013. FINDINGS: CT HEAD FINDINGS Well pneumatized paranasal sinuses and mastoids, probable small left maxillary mucous retention cyst. Broad-based left anterior scalp laceration with hematoma measuring up to 11 mm in thickness. Trace subcutaneous gas. Underlying left frontal bone is intact. Visualized orbit soft tissues are within normal limits. Other scalp soft tissues are within normal limits. No calvarium fracture. Calcified atherosclerosis at the skull base. Tortuosity and mild dolichoectasia at the vertebrobasilar junction with prominent calcified plaque. No acute intracranial hemorrhage identified. No midline shift, mass effect, or evidence of intracranial mass lesion. No ventriculomegaly. No cortically based acute infarct identified. Small areas of cerebral white matter hypodensity, including involvement of the right external and internal capsules suggesting chronic lacunar infarcts. No suspicious intracranial vascular hyperdensity. CT  CERVICAL SPINE FINDINGS Visualized skull base is intact. No atlanto-occipital dissociation. 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 Chronic ankylosis versus congenital incomplete segmentation of C2-C3, with interbody and bilateral posterior element fusion. Straightening of upper cervical lordosis. Cervicothoracic junction alignment is within normal limits. Bilateral posterior element alignment is within normal limits. There is also bilateral posterior element ankylosis at C6-C7. Advanced chronic disc and endplate degeneration at C5-C6. Right occipital condyles -C1-C2 joint space loss with degenerative subchondral sclerosis, cysts, and spurring. No acute cervical spine fracture. Visualized upper thoracic levels appear grossly intact, with T1-T2 interbody and posterior element ankylosis. Negative lung apices. Calcified carotid artery atherosclerosis in the neck. Otherwise negative noncontrast paraspinal soft tissues. IMPRESSION: 1. Left scalp hematoma and laceration without underlying fracture. 2. Evidence of chronic white matter small vessel disease. No acute intracranial abnormality. 3. No acute fracture or listhesis identified in the cervical spine. Ligamentous injury is not excluded. 4. Intermittent cervical and upper thoracic spine ankylosis. Electronically Signed   By: Genevie Ann M.D.   On: 04/06/2016 15:08    2D ECHO: Study Conclusions  - Left ventricle: The cavity size was normal. There was  moderate  focal basal hypertrophy. Systolic function was vigorous. The  estimated ejection fraction was in the range of 65% to 70%. Wall  motion was normal; there were no regional wall motion  abnormalities. Features are consistent with a pseudonormal left  ventricular filling pattern, with concomitant abnormal relaxation  and increased filling pressure (grade 2 diastolic dysfunction). - Aortic valve: Moderately calcified annulus. Trileaflet; mildly  thickened, mildly calcified leaflets. -  Mitral valve: Minimal focal calcification of the anterior leaflet  (medial segment(s)). There was trivial regurgitation. - Tricuspid valve: There was trivial regurgitation. - Pulmonic valve: There was mild regurgitation. - Pulmonary arteries: PA peak pressure: 31 mm Hg (S).  Disposition and Follow-up:    DISPOSITION: home    DISCHARGE FOLLOW-UP Follow-up Information    Follow up with ROBBINS,ROBERT A, MD. Schedule an appointment as soon as possible for a visit in 2 weeks.   Specialty:  Family Medicine   Why:  for hospital follow-up   Contact information:   [address] [place] [id number]        Time spent on Discharge: 25 mins   Signed:   Atavia Poppe M.D. [facility] 04/07/2016, 4:14 PM Pager: [phone]

## 2016-04-07 NOTE — Progress Notes (Signed)
Echocardiogram 2D Echocardiogram has been performed.  Nolon Rod 04/07/2016, 12:42 PM

## 2016-04-07 NOTE — Evaluation (Signed)
Physical Therapy Evaluation Patient Details Name: Frank Phillips MRN: [id number] DOB: 03-16-1946 Today's Date: 04/07/2016   History of Present Illness  patient is a 70 yo male who presents s/p Fall with left scalp hematoma and laceration.  Clinical Impression  Patient seen for mobility evaluation and balance assessment s/p fall. Patient ambulated well without need for assist or device, performed stair negotiation and higher level balance tasks. No significant LOB noted. Patient with decrease cadence and lower DGI score related to increased time to perform. At this time, no further acute PT needs identified. Will sign off. Patient in agreement. OF NOTE: VSS throughout mobility.    Follow Up Recommendations No PT follow up    Equipment Recommendations       Recommendations for Other Services       Precautions / Restrictions Precautions Precautions: Fall Restrictions Weight Bearing Restrictions: No      Mobility  Bed Mobility Overal bed mobility: Independent                Transfers Overall transfer level: Independent               General transfer comment: no physical assist or cues required  Ambulation/Gait Ambulation/Gait assistance: Independent Ambulation Distance (Feet): 380 Feet Assistive device: None Gait Pattern/deviations: Step-through pattern;Decreased stride length;Drifts right/left Gait velocity: decreased Gait velocity interpretation: Below normal speed for age/gender General Gait Details: modest instability noted with ambulation but no significant LOB or need for assist required.  Stairs Stairs: Yes Stairs assistance: Modified independent (Device/Increase time) Stair Management: One rail Right;Alternating pattern Number of Stairs: 6 General stair comments: Patient with no difficulty on steps  Wheelchair Mobility    Modified Rankin (Stroke Patients Only)       Balance Overall balance assessment: History of Falls                            High level balance activites: Side stepping;Backward walking;Direction changes;Turns;Sudden stops;Head turns High Level Balance Comments: no physical assist required, steady with higher level tasks but decreased speed with head turns Standardized Balance Assessment Standardized Balance Assessment : Dynamic Gait Index   Dynamic Gait Index Level Surface: Mild Impairment Change in Gait Speed: Mild Impairment Gait with Horizontal Head Turns: Mild Impairment Gait with Vertical Head Turns: Mild Impairment Gait and Pivot Turn: Mild Impairment Step Over Obstacle: Mild Impairment Step Around Obstacles: Mild Impairment Steps: Mild Impairment Total Score: 16       Pertinent Vitals/Pain Pain Assessment: 0-10 Pain Score: 2  Pain Location: head Pain Intervention(s): Monitored during session    Home Living Family/patient expects to be discharged to:: Private residence Living Arrangements: Spouse/significant other Available Help at Discharge: Family Type of Home: House Home Access: Stairs to enter Entrance Stairs-Rails: Can reach both Entrance Stairs-Number of Steps: 4 Home Layout: One level Home Equipment: Environmental consultant - 2 wheels;Cane - single point      Prior Function Level of Independence: Independent               Hand Dominance   Dominant Hand: Right    Extremity/Trunk Assessment   Upper Extremity Assessment: Overall WFL for tasks assessed (resting tremor noted at baseline)           Lower Extremity Assessment: Overall WFL for tasks assessed         Communication   Communication: No difficulties  Cognition Arousal/Alertness: Awake/alert Behavior During Therapy: WFL for tasks assessed/performed Overall Cognitive Status: Within  Functional Limits for tasks assessed                      General Comments General comments (skin integrity, edema, etc.): patient with stitiches in head lac    Exercises        Assessment/Plan    PT  Assessment Patent does not need any further PT services  PT Diagnosis Difficulty walking;Acute pain   PT Problem List    PT Treatment Interventions     PT Goals (Current goals can be found in the Care Plan section) Acute Rehab PT Goals Patient Stated Goal: to go home PT Goal Formulation: All assessment and education complete, DC therapy    Frequency     Barriers to discharge        Co-evaluation               End of Session Equipment Utilized During Treatment: Gait belt Activity Tolerance: Patient tolerated treatment well Patient left: in bed;with call bell/phone within reach;with bed alarm set Nurse Communication: Mobility status    Functional Assessment Tool Used: dgi Functional Limitation: Mobility: Walking and moving around Mobility: Walking and Moving Around Current Status ([id number]): At least 1 percent but less than 20 percent impaired, limited or restricted Mobility: Walking and Moving Around Goal Status [phone]): At least 1 percent but less than 20 percent impaired, limited or restricted Mobility: Walking and Moving Around Discharge Status [phone]): At least 1 percent but less than 20 percent impaired, limited or restricted    Time: 1521-1541 PT Time Calculation (min) (ACUTE ONLY): 20 min   Charges:   PT Evaluation $PT Eval Low Complexity: 1 Procedure     PT G Codes:   PT G-Codes **NOT FOR INPATIENT CLASS** Functional Assessment Tool Used: dgi Functional Limitation: Mobility: Walking and moving around Mobility: Walking and Moving Around Current Status ([id number]): At least 1 percent but less than 20 percent impaired, limited or restricted Mobility: Walking and Moving Around Goal Status [phone]): At least 1 percent but less than 20 percent impaired, limited or restricted Mobility: Walking and Moving Around Discharge Status [phone]): At least 1 percent but less than 20 percent impaired, limited or restricted    Frank Phillips 04/07/2016, 4:11 PM  Frank Phillips, PT DPT   [phone]

## 2016-04-07 NOTE — Discharge Instructions (Signed)
Dizziness Dizziness is a common problem. It is a feeling of unsteadiness or light-headedness. You may feel like you are about to faint. Dizziness can lead to injury if you stumble or fall. Anyone can become dizzy, but dizziness is more common in older adults. This condition can be caused by a number of things, including medicines, dehydration, or illness. HOME CARE INSTRUCTIONS Taking these steps may help with your condition: Eating and Drinking  Drink enough fluid to keep your urine clear or pale yellow. This helps to keep you from becoming dehydrated. Try to drink more clear fluids, such as water.  Do not drink alcohol.  Limit your caffeine intake if directed by your health care provider.  Limit your salt intake if directed by your health care provider. Activity  Avoid making quick movements.  Rise slowly from chairs and steady yourself until you feel okay.  In the morning, first sit up on the side of the bed. When you feel okay, stand slowly while you hold onto something until you know that your balance is fine.  Move your legs often if you need to stand in one place for a long time. Tighten and relax your muscles in your legs while you are standing.  Do not drive or operate heavy machinery if you feel dizzy.  Avoid bending down if you feel dizzy. Place items in your home so that they are easy for you to reach without leaning over. Lifestyle  Do not use any tobacco products, including cigarettes, chewing tobacco, or electronic cigarettes. If you need help quitting, ask your health care provider.  Try to reduce your stress level, such as with yoga or meditation. Talk with your health care provider if you need help. General Instructions  Watch your dizziness for any changes.  Take medicines only as directed by your health care provider. Talk with your health care provider if you think that your dizziness is caused by a medicine that you are taking.  Tell a friend or a family  member that you are feeling dizzy. If he or she notices any changes in your behavior, have this person call your health care provider.  Keep all follow-up visits as directed by your health care provider. This is important. SEEK MEDICAL CARE IF:  Your dizziness does not go away.  Your dizziness or light-headedness gets worse.  You feel nauseous.  You have reduced hearing.  You have new symptoms.  You are unsteady on your feet or you feel like the room is spinning. SEEK IMMEDIATE MEDICAL CARE IF:  You vomit or have diarrhea and are unable to eat or drink anything.  You have problems talking, walking, swallowing, or using your arms, hands, or legs.  You feel generally weak.  You are not thinking clearly or you have trouble forming sentences. It may take a friend or family member to notice this.  You have chest pain, abdominal pain, shortness of breath, or sweating.  Your vision changes.  You notice any bleeding.  You have a headache.  You have neck pain or a stiff neck.  You have a fever.   This information is not intended to replace advice given to you by your health care provider. Make sure you discuss any questions you have with your health care provider.   Document Released: 04/12/2001 Document Revised: 03/03/2015 Document Reviewed: 10/13/2014 Elsevier Interactive Patient Education 2016 [place] Injury, Adult You have a head injury. Headaches and throwing up (vomiting) are common after a head  injury. It should be easy to wake up from sleeping. Sometimes you must stay in the hospital. Most problems happen within the first 24 hours. Side effects may occur up to 7-10 days after the injury.  WHAT ARE THE TYPES OF HEAD INJURIES? Head injuries can be as minor as a bump. Some head injuries can be more severe. More severe head injuries include:  A jarring injury to the brain (concussion).  A bruise of the brain (contusion). This mean there is bleeding in the  brain that can cause swelling.  A cracked skull (skull fracture).  Bleeding in the brain that collects, clots, and forms a bump (hematoma). WHEN SHOULD I GET HELP RIGHT AWAY?   You are confused or sleepy.  You cannot be woken up.  You feel sick to your stomach (nauseous) or keep throwing up (vomiting).  Your dizziness or unsteadiness is getting worse.  You have very bad, lasting headaches that are not helped by medicine. Take medicines only as told by your doctor.  You cannot use your arms or legs like normal.  You cannot walk.  You notice changes in the black spots in the center of the colored part of your eye (pupil).  You have clear or bloody fluid coming from your nose or ears.  You have trouble seeing. During the next 24 hours after the injury, you must stay with someone who can watch you. This person should get help right away (call 911 in the [place] if you start to shake and are not able to control it (have seizures), you pass out, or you are unable to wake up. HOW CAN I PREVENT A HEAD INJURY IN THE FUTURE?  Wear seat belts.  Wear a helmet while bike riding and playing sports like football.  Stay away from dangerous activities around the house. WHEN CAN I RETURN TO NORMAL ACTIVITIES AND ATHLETICS? See your doctor before doing these activities. You should not do normal activities or play contact sports until 1 week after the following symptoms have stopped:  Headache that does not go away.  Dizziness.  Poor attention.  Confusion.  Memory problems.  Sickness to your stomach or throwing up.  Tiredness.  Fussiness.  Bothered by bright lights or loud noises.  Anxiousness or depression.  Restless sleep. MAKE SURE YOU:   Understand these instructions.  Will watch your condition.  Will get help right away if you are not doing well or get worse.   This information is not intended to replace advice given to you by your health care provider. Make sure you  discuss any questions you have with your health care provider.   Document Released: 09/29/2008 Document Revised: 11/07/2014 Document Reviewed: 06/24/2013 Elsevier Interactive Patient Education 2016 [employer] Inc[employer]    Orthostatic Hypotension Orthostatic hypotension is a sudden drop in blood pressure. It happens when you quickly stand up from a seated or lying position. You may feel dizzy or light-headed. This can last for just a few seconds or for up to a few minutes. It is usually not a serious problem. However, if this happens frequently or gets worse, it can be a sign of something more serious. CAUSES  Different things can cause orthostatic hypotension, including:   Loss of body fluids (dehydration).  Medicines that lower blood pressure.  Sudden changes in posture, such as standing up quickly after you have been sitting or lying down.  Taking too much of your medicine. SIGNS AND SYMPTOMS   Light-headedness or dizziness.   Fainting  or near-fainting.   A fast heart rate.   Weakness.   Feeling tired (fatigue).  DIAGNOSIS  Your health care provider may do several things to help diagnose your condition and identify the cause. These may include:   Taking a medical history and doing a physical exam.  Checking your blood pressure. Your health care provider will check your blood pressure when you are:  Lying down.  Sitting.  Standing.  Using tilt table testing. In this test, you lie down on a table that moves from a lying position to a standing position. You will be strapped onto the table. This test monitors your blood pressure and heart rate when you are in different positions. TREATMENT  Treatment will vary depending on the cause. Possible treatments include:   Changing the dosage of your medicines.  Wearing compression stockings on your lower legs.  Standing up slowly after sitting or lying down.  Eating more salt.  Eating frequent, small meals.  In some  cases, getting IV fluids.  Taking medicine to enhance fluid retention. HOME CARE INSTRUCTIONS  Only take over-the-counter or prescription medicines as directed by your health care provider.  Follow your health care provider's instructions for changing the dosage of your current medicines.  Do not stop or adjust your medicine on your own.  Stand up slowly after sitting or lying down. This allows your body to adjust to the different position.  Wear compression stockings as directed.  Eat extra salt as directed.  Do not add extra salt to your diet unless directed to by your health care provider.  Eat frequent, small meals.  Avoid standing suddenly after eating.  Avoid hot showers or excessive heat as directed by your health care provider.  Keep all follow-up appointments. SEEK MEDICAL CARE IF:  You continue to feel dizzy or light-headed after standing.  You feel groggy or confused.  You feel cold, clammy, or sick to your stomach (nauseous).  You have blurred vision.  You feel short of breath. SEEK IMMEDIATE MEDICAL CARE IF:   You faint after standing.  You have chest pain.  You have difficulty breathing.   You lose feeling or movement in your arms or legs.   You have slurred speech or difficulty talking, or you are unable to talk.  MAKE SURE YOU:   Understand these instructions.  Will watch your condition.  Will get help right away if you are not doing well or get worse.   This information is not intended to replace advice given to you by your health care provider. Make sure you discuss any questions you have with your health care provider.   Document Released: 10/07/2002 Document Revised: 10/22/2013 Document Reviewed: 08/09/2013 Elsevier Interactive Patient Education 2016 [employer].   Syncope Syncope is a medical term for fainting or passing out. This means you lose consciousness and drop to the ground. People are generally unconscious for less  than 5 minutes. You may have some muscle twitches for up to 15 seconds before waking up and returning to normal. Syncope occurs more often in older adults, but it can happen to anyone. While most causes of syncope are not dangerous, syncope can be a sign of a serious medical problem. It is important to seek medical care.  CAUSES  Syncope is caused by a sudden drop in blood flow to the brain. The specific cause is often not determined. Factors that can bring on syncope include:  Taking medicines that lower blood pressure.  Sudden changes in  posture, such as standing up quickly.  Taking more medicine than prescribed.  Standing in one place for too long.  Seizure disorders.  Dehydration and excessive exposure to heat.  Low blood sugar (hypoglycemia).  Straining to have a bowel movement.  Heart disease, irregular heartbeat, or other circulatory problems.  Fear, emotional distress, seeing blood, or severe pain. SYMPTOMS  Right before fainting, you may:  Feel dizzy or light-headed.  Feel nauseous.  See all white or all black in your field of vision.  Have cold, clammy skin. DIAGNOSIS  Your health care provider will ask about your symptoms, perform a physical exam, and perform an electrocardiogram (ECG) to record the electrical activity of your heart. Your health care provider may also perform other heart or blood tests to determine the cause of your syncope which may include:  Transthoracic echocardiogram (TTE). During echocardiography, sound waves are used to evaluate how blood flows through your heart.  Transesophageal echocardiogram (TEE).  Cardiac monitoring. This allows your health care provider to monitor your heart rate and rhythm in real time.  Holter monitor. This is a portable device that records your heartbeat and can help diagnose heart arrhythmias. It allows your health care provider to track your heart activity for several days, if needed.  Stress tests by exercise  or by giving medicine that makes the heart beat faster. TREATMENT  In most cases, no treatment is needed. Depending on the cause of your syncope, your health care provider may recommend changing or stopping some of your medicines. HOME CARE INSTRUCTIONS  Have someone stay with you until you feel stable.  Do not drive, use machinery, or play sports until your health care provider says it is okay.  Keep all follow-up appointments as directed by your health care provider.  Lie down right away if you start feeling like you might faint. Breathe deeply and steadily. Wait until all the symptoms have passed.  Drink enough fluids to keep your urine clear or pale yellow.  If you are taking blood pressure or heart medicine, get up slowly and take several minutes to sit and then stand. This can reduce dizziness. SEEK IMMEDIATE MEDICAL CARE IF:   You have a severe headache.  You have unusual pain in the chest, abdomen, or back.  You are bleeding from your mouth or rectum, or you have black or tarry stool.  You have an irregular or very fast heartbeat.  You have pain with breathing.  You have repeated fainting or seizure-like jerking during an episode.  You faint when sitting or lying down.  You have confusion.  You have trouble walking.  You have severe weakness.  You have vision problems. If you fainted, call your local emergency services (911 in [place] Do not drive yourself to the hospital.    This information is not intended to replace advice given to you by your health care provider. Make sure you discuss any questions you have with your health care provider.   Document Released: 10/17/2005 Document Revised: 03/03/2015 Document Reviewed: 12/16/2011 Elsevier Interactive Patient Education [employer]

## 2016-04-07 NOTE — Care Management Obs Status (Signed)
MEDICARE OBSERVATION STATUS NOTIFICATION   Patient Details  Name: Frank Phillips MRN: [id number] Date of Birth: 1946-09-10   Medicare Observation Status Notification Given:  Yes    Gala Lewandowsky, RN 04/07/2016, 11:03 AM

## 2016-04-08 LAB — URINE CULTURE: SPECIAL REQUESTS: NORMAL

## 2016-04-20 DIAGNOSIS — Z6824 Body mass index (BMI) 24.0-24.9, adult: Secondary | ICD-10-CM | POA: Diagnosis not present

## 2016-04-20 DIAGNOSIS — I951 Orthostatic hypotension: Secondary | ICD-10-CM | POA: Diagnosis not present

## 2016-04-20 DIAGNOSIS — R413 Other amnesia: Secondary | ICD-10-CM | POA: Insufficient documentation

## 2016-04-20 DIAGNOSIS — R251 Tremor, unspecified: Secondary | ICD-10-CM | POA: Diagnosis not present

## 2016-05-05 DIAGNOSIS — F419 Anxiety disorder, unspecified: Secondary | ICD-10-CM | POA: Diagnosis not present

## 2016-05-05 DIAGNOSIS — N401 Enlarged prostate with lower urinary tract symptoms: Secondary | ICD-10-CM | POA: Diagnosis not present

## 2016-05-05 DIAGNOSIS — G4700 Insomnia, unspecified: Secondary | ICD-10-CM | POA: Diagnosis not present

## 2016-05-05 DIAGNOSIS — R5381 Other malaise: Secondary | ICD-10-CM | POA: Diagnosis not present

## 2016-05-05 DIAGNOSIS — I10 Essential (primary) hypertension: Secondary | ICD-10-CM | POA: Diagnosis not present

## 2016-05-05 DIAGNOSIS — R5383 Other fatigue: Secondary | ICD-10-CM | POA: Diagnosis not present

## 2016-07-14 DIAGNOSIS — D649 Anemia, unspecified: Secondary | ICD-10-CM | POA: Diagnosis not present

## 2016-07-14 DIAGNOSIS — E538 Deficiency of other specified B group vitamins: Secondary | ICD-10-CM | POA: Diagnosis not present

## 2016-07-14 DIAGNOSIS — R5381 Other malaise: Secondary | ICD-10-CM | POA: Diagnosis not present

## 2016-07-14 DIAGNOSIS — Z23 Encounter for immunization: Secondary | ICD-10-CM | POA: Diagnosis not present

## 2016-07-14 DIAGNOSIS — R5383 Other fatigue: Secondary | ICD-10-CM | POA: Diagnosis not present

## 2016-07-14 DIAGNOSIS — Z0000 Encounter for general adult medical examination without abnormal findings: Secondary | ICD-10-CM | POA: Diagnosis not present

## 2016-07-20 DIAGNOSIS — R413 Other amnesia: Secondary | ICD-10-CM | POA: Diagnosis not present

## 2016-07-20 DIAGNOSIS — R251 Tremor, unspecified: Secondary | ICD-10-CM | POA: Diagnosis not present

## 2016-07-20 DIAGNOSIS — Z6826 Body mass index (BMI) 26.0-26.9, adult: Secondary | ICD-10-CM | POA: Diagnosis not present

## 2016-07-20 DIAGNOSIS — G4733 Obstructive sleep apnea (adult) (pediatric): Secondary | ICD-10-CM | POA: Insufficient documentation

## 2016-07-20 DIAGNOSIS — I951 Orthostatic hypotension: Secondary | ICD-10-CM | POA: Diagnosis not present

## 2016-08-04 DIAGNOSIS — Z8711 Personal history of peptic ulcer disease: Secondary | ICD-10-CM | POA: Diagnosis not present

## 2016-08-04 DIAGNOSIS — K5080 Crohn's disease of both small and large intestine without complications: Secondary | ICD-10-CM | POA: Diagnosis not present

## 2016-08-04 DIAGNOSIS — K219 Gastro-esophageal reflux disease without esophagitis: Secondary | ICD-10-CM | POA: Diagnosis not present

## 2016-08-19 DIAGNOSIS — K5080 Crohn's disease of both small and large intestine without complications: Secondary | ICD-10-CM | POA: Diagnosis not present

## 2016-08-19 DIAGNOSIS — K5000 Crohn's disease of small intestine without complications: Secondary | ICD-10-CM | POA: Diagnosis not present

## 2016-10-17 DIAGNOSIS — G4733 Obstructive sleep apnea (adult) (pediatric): Secondary | ICD-10-CM | POA: Diagnosis not present

## 2016-11-09 DIAGNOSIS — I951 Orthostatic hypotension: Secondary | ICD-10-CM | POA: Diagnosis not present

## 2016-11-09 DIAGNOSIS — R413 Other amnesia: Secondary | ICD-10-CM | POA: Diagnosis not present

## 2016-11-09 DIAGNOSIS — R251 Tremor, unspecified: Secondary | ICD-10-CM | POA: Diagnosis not present

## 2016-11-09 DIAGNOSIS — G4733 Obstructive sleep apnea (adult) (pediatric): Secondary | ICD-10-CM | POA: Diagnosis not present

## 2016-11-09 DIAGNOSIS — Z6826 Body mass index (BMI) 26.0-26.9, adult: Secondary | ICD-10-CM | POA: Diagnosis not present

## 2016-11-11 DIAGNOSIS — R251 Tremor, unspecified: Secondary | ICD-10-CM | POA: Diagnosis not present

## 2016-11-11 DIAGNOSIS — R413 Other amnesia: Secondary | ICD-10-CM | POA: Diagnosis not present

## 2016-11-29 DIAGNOSIS — E1165 Type 2 diabetes mellitus with hyperglycemia: Secondary | ICD-10-CM | POA: Diagnosis not present

## 2016-11-29 DIAGNOSIS — G4733 Obstructive sleep apnea (adult) (pediatric): Secondary | ICD-10-CM | POA: Diagnosis not present

## 2016-11-29 DIAGNOSIS — F339 Major depressive disorder, recurrent, unspecified: Secondary | ICD-10-CM | POA: Diagnosis not present

## 2016-11-29 DIAGNOSIS — E782 Mixed hyperlipidemia: Secondary | ICD-10-CM | POA: Diagnosis not present

## 2016-12-07 DIAGNOSIS — G4733 Obstructive sleep apnea (adult) (pediatric): Secondary | ICD-10-CM | POA: Diagnosis not present

## 2016-12-07 DIAGNOSIS — R251 Tremor, unspecified: Secondary | ICD-10-CM | POA: Diagnosis not present

## 2016-12-07 DIAGNOSIS — R413 Other amnesia: Secondary | ICD-10-CM | POA: Diagnosis not present

## 2016-12-07 DIAGNOSIS — Z6827 Body mass index (BMI) 27.0-27.9, adult: Secondary | ICD-10-CM | POA: Diagnosis not present

## 2017-01-11 DIAGNOSIS — J40 Bronchitis, not specified as acute or chronic: Secondary | ICD-10-CM | POA: Diagnosis not present

## 2017-01-11 DIAGNOSIS — J329 Chronic sinusitis, unspecified: Secondary | ICD-10-CM | POA: Diagnosis not present

## 2017-02-15 DIAGNOSIS — Z7984 Long term (current) use of oral hypoglycemic drugs: Secondary | ICD-10-CM | POA: Diagnosis not present

## 2017-02-15 DIAGNOSIS — H524 Presbyopia: Secondary | ICD-10-CM | POA: Diagnosis not present

## 2017-02-15 DIAGNOSIS — H5203 Hypermetropia, bilateral: Secondary | ICD-10-CM | POA: Diagnosis not present

## 2017-02-15 DIAGNOSIS — E119 Type 2 diabetes mellitus without complications: Secondary | ICD-10-CM | POA: Diagnosis not present

## 2017-02-15 DIAGNOSIS — H25813 Combined forms of age-related cataract, bilateral: Secondary | ICD-10-CM | POA: Diagnosis not present

## 2017-02-15 DIAGNOSIS — H52223 Regular astigmatism, bilateral: Secondary | ICD-10-CM | POA: Diagnosis not present

## 2017-02-15 DIAGNOSIS — I10 Essential (primary) hypertension: Secondary | ICD-10-CM | POA: Diagnosis not present

## 2017-07-10 DIAGNOSIS — Z23 Encounter for immunization: Secondary | ICD-10-CM | POA: Diagnosis not present

## 2017-07-10 DIAGNOSIS — M542 Cervicalgia: Secondary | ICD-10-CM | POA: Insufficient documentation

## 2017-07-10 DIAGNOSIS — F5104 Psychophysiologic insomnia: Secondary | ICD-10-CM | POA: Diagnosis not present

## 2017-09-12 DIAGNOSIS — M129 Arthropathy, unspecified: Secondary | ICD-10-CM | POA: Diagnosis not present

## 2017-09-12 DIAGNOSIS — N529 Male erectile dysfunction, unspecified: Secondary | ICD-10-CM | POA: Diagnosis not present

## 2017-09-13 DIAGNOSIS — N529 Male erectile dysfunction, unspecified: Secondary | ICD-10-CM | POA: Insufficient documentation

## 2017-10-19 DIAGNOSIS — N401 Enlarged prostate with lower urinary tract symptoms: Secondary | ICD-10-CM | POA: Diagnosis not present

## 2017-10-19 DIAGNOSIS — R351 Nocturia: Secondary | ICD-10-CM | POA: Diagnosis not present

## 2017-10-19 DIAGNOSIS — N318 Other neuromuscular dysfunction of bladder: Secondary | ICD-10-CM | POA: Diagnosis not present

## 2017-10-26 DIAGNOSIS — N401 Enlarged prostate with lower urinary tract symptoms: Secondary | ICD-10-CM | POA: Diagnosis not present

## 2017-10-26 DIAGNOSIS — R351 Nocturia: Secondary | ICD-10-CM | POA: Diagnosis not present

## 2017-11-09 DIAGNOSIS — N401 Enlarged prostate with lower urinary tract symptoms: Secondary | ICD-10-CM | POA: Diagnosis not present

## 2017-11-09 DIAGNOSIS — N3020 Other chronic cystitis without hematuria: Secondary | ICD-10-CM | POA: Diagnosis not present

## 2017-11-09 DIAGNOSIS — N318 Other neuromuscular dysfunction of bladder: Secondary | ICD-10-CM | POA: Diagnosis not present

## 2017-11-09 DIAGNOSIS — R351 Nocturia: Secondary | ICD-10-CM | POA: Diagnosis not present

## 2017-12-27 DIAGNOSIS — R1110 Vomiting, unspecified: Secondary | ICD-10-CM | POA: Diagnosis not present

## 2017-12-27 DIAGNOSIS — F329 Major depressive disorder, single episode, unspecified: Secondary | ICD-10-CM | POA: Diagnosis not present

## 2017-12-27 DIAGNOSIS — K219 Gastro-esophageal reflux disease without esophagitis: Secondary | ICD-10-CM | POA: Diagnosis not present

## 2017-12-27 DIAGNOSIS — Z0000 Encounter for general adult medical examination without abnormal findings: Secondary | ICD-10-CM | POA: Diagnosis not present

## 2017-12-28 DIAGNOSIS — E119 Type 2 diabetes mellitus without complications: Secondary | ICD-10-CM | POA: Diagnosis not present

## 2017-12-28 DIAGNOSIS — H25813 Combined forms of age-related cataract, bilateral: Secondary | ICD-10-CM | POA: Diagnosis not present

## 2018-01-06 DIAGNOSIS — N419 Inflammatory disease of prostate, unspecified: Secondary | ICD-10-CM | POA: Diagnosis not present

## 2018-01-06 DIAGNOSIS — N3001 Acute cystitis with hematuria: Secondary | ICD-10-CM | POA: Diagnosis not present

## 2018-01-08 DIAGNOSIS — N210 Calculus in bladder: Secondary | ICD-10-CM | POA: Diagnosis not present

## 2018-01-08 DIAGNOSIS — N410 Acute prostatitis: Secondary | ICD-10-CM | POA: Diagnosis not present

## 2018-01-08 DIAGNOSIS — K760 Fatty (change of) liver, not elsewhere classified: Secondary | ICD-10-CM | POA: Diagnosis not present

## 2018-01-09 DIAGNOSIS — N3020 Other chronic cystitis without hematuria: Secondary | ICD-10-CM | POA: Diagnosis not present

## 2018-01-09 DIAGNOSIS — N201 Calculus of ureter: Secondary | ICD-10-CM | POA: Diagnosis not present

## 2018-01-09 DIAGNOSIS — R1083 Colic: Secondary | ICD-10-CM | POA: Diagnosis not present

## 2018-01-09 DIAGNOSIS — N23 Unspecified renal colic: Secondary | ICD-10-CM | POA: Diagnosis not present

## 2018-01-11 DIAGNOSIS — I2510 Atherosclerotic heart disease of native coronary artery without angina pectoris: Secondary | ICD-10-CM | POA: Diagnosis not present

## 2018-01-11 DIAGNOSIS — N201 Calculus of ureter: Secondary | ICD-10-CM | POA: Diagnosis not present

## 2018-01-11 DIAGNOSIS — Z01818 Encounter for other preprocedural examination: Secondary | ICD-10-CM | POA: Diagnosis not present

## 2018-01-11 DIAGNOSIS — N3289 Other specified disorders of bladder: Secondary | ICD-10-CM | POA: Diagnosis not present

## 2018-01-11 DIAGNOSIS — I10 Essential (primary) hypertension: Secondary | ICD-10-CM | POA: Diagnosis not present

## 2018-01-11 DIAGNOSIS — Z8673 Personal history of transient ischemic attack (TIA), and cerebral infarction without residual deficits: Secondary | ICD-10-CM | POA: Diagnosis not present

## 2018-01-11 DIAGNOSIS — M1990 Unspecified osteoarthritis, unspecified site: Secondary | ICD-10-CM | POA: Diagnosis not present

## 2018-01-11 DIAGNOSIS — Z79899 Other long term (current) drug therapy: Secondary | ICD-10-CM | POA: Diagnosis not present

## 2018-01-11 DIAGNOSIS — N400 Enlarged prostate without lower urinary tract symptoms: Secondary | ICD-10-CM | POA: Diagnosis not present

## 2018-01-11 DIAGNOSIS — E119 Type 2 diabetes mellitus without complications: Secondary | ICD-10-CM | POA: Diagnosis not present

## 2018-01-11 DIAGNOSIS — K5090 Crohn's disease, unspecified, without complications: Secondary | ICD-10-CM | POA: Diagnosis not present

## 2018-01-14 DIAGNOSIS — N200 Calculus of kidney: Secondary | ICD-10-CM | POA: Diagnosis not present

## 2018-01-14 DIAGNOSIS — S199XXA Unspecified injury of neck, initial encounter: Secondary | ICD-10-CM | POA: Diagnosis not present

## 2018-01-14 DIAGNOSIS — M4692 Unspecified inflammatory spondylopathy, cervical region: Secondary | ICD-10-CM | POA: Diagnosis not present

## 2018-01-14 DIAGNOSIS — R112 Nausea with vomiting, unspecified: Secondary | ICD-10-CM | POA: Diagnosis not present

## 2018-01-14 DIAGNOSIS — R1111 Vomiting without nausea: Secondary | ICD-10-CM | POA: Diagnosis not present

## 2018-01-14 DIAGNOSIS — S299XXA Unspecified injury of thorax, initial encounter: Secondary | ICD-10-CM | POA: Diagnosis not present

## 2018-01-14 DIAGNOSIS — Z87442 Personal history of urinary calculi: Secondary | ICD-10-CM | POA: Diagnosis not present

## 2018-01-16 DIAGNOSIS — N210 Calculus in bladder: Secondary | ICD-10-CM | POA: Diagnosis not present

## 2018-01-16 DIAGNOSIS — N3020 Other chronic cystitis without hematuria: Secondary | ICD-10-CM | POA: Diagnosis not present

## 2018-01-17 DIAGNOSIS — I739 Peripheral vascular disease, unspecified: Secondary | ICD-10-CM | POA: Diagnosis not present

## 2018-01-17 DIAGNOSIS — I10 Essential (primary) hypertension: Secondary | ICD-10-CM | POA: Diagnosis not present

## 2018-01-17 DIAGNOSIS — E7800 Pure hypercholesterolemia, unspecified: Secondary | ICD-10-CM | POA: Diagnosis not present

## 2018-01-17 DIAGNOSIS — E119 Type 2 diabetes mellitus without complications: Secondary | ICD-10-CM | POA: Diagnosis not present

## 2018-01-17 DIAGNOSIS — I252 Old myocardial infarction: Secondary | ICD-10-CM | POA: Diagnosis not present

## 2018-01-17 DIAGNOSIS — I2510 Atherosclerotic heart disease of native coronary artery without angina pectoris: Secondary | ICD-10-CM | POA: Diagnosis not present

## 2018-01-17 DIAGNOSIS — N210 Calculus in bladder: Secondary | ICD-10-CM | POA: Diagnosis not present

## 2018-01-17 DIAGNOSIS — Z955 Presence of coronary angioplasty implant and graft: Secondary | ICD-10-CM | POA: Diagnosis not present

## 2018-01-19 ENCOUNTER — Other Ambulatory Visit: Payer: Self-pay

## 2018-01-19 NOTE — Patient Outreach (Signed)
[facility]) Care Management  01/19/2018  Frank Phillips 1946-04-30 [id number]   Referral received. No outreach warranted at this time. Transition of Care  will be completed by primary care provider office who will refer to [facility] care management if needed.  Plan: RN CM will close case at this time.  Bary Leriche, RN, MSN [facility] Care Management Care Management Coordinator Direct Line [phone] Toll Free: [phone]  Fax: [phone]

## 2018-01-23 DIAGNOSIS — I10 Essential (primary) hypertension: Secondary | ICD-10-CM | POA: Diagnosis not present

## 2018-01-23 DIAGNOSIS — E1165 Type 2 diabetes mellitus with hyperglycemia: Secondary | ICD-10-CM | POA: Diagnosis not present

## 2018-01-23 DIAGNOSIS — G2581 Restless legs syndrome: Secondary | ICD-10-CM | POA: Diagnosis not present

## 2018-01-23 DIAGNOSIS — E782 Mixed hyperlipidemia: Secondary | ICD-10-CM | POA: Diagnosis not present

## 2018-01-24 DIAGNOSIS — N420 Calculus of prostate: Secondary | ICD-10-CM | POA: Diagnosis not present

## 2018-01-24 DIAGNOSIS — N401 Enlarged prostate with lower urinary tract symptoms: Secondary | ICD-10-CM | POA: Diagnosis not present

## 2018-01-25 DIAGNOSIS — F332 Major depressive disorder, recurrent severe without psychotic features: Secondary | ICD-10-CM | POA: Diagnosis not present

## 2018-01-25 DIAGNOSIS — F329 Major depressive disorder, single episode, unspecified: Secondary | ICD-10-CM | POA: Diagnosis not present

## 2018-01-25 DIAGNOSIS — R45851 Suicidal ideations: Secondary | ICD-10-CM | POA: Diagnosis not present

## 2018-01-25 DIAGNOSIS — F341 Dysthymic disorder: Secondary | ICD-10-CM | POA: Diagnosis not present

## 2018-01-25 DIAGNOSIS — E1165 Type 2 diabetes mellitus with hyperglycemia: Secondary | ICD-10-CM | POA: Diagnosis not present

## 2018-01-25 DIAGNOSIS — N3000 Acute cystitis without hematuria: Secondary | ICD-10-CM | POA: Diagnosis not present

## 2018-01-25 DIAGNOSIS — K5900 Constipation, unspecified: Secondary | ICD-10-CM | POA: Diagnosis not present

## 2018-01-26 DIAGNOSIS — F332 Major depressive disorder, recurrent severe without psychotic features: Secondary | ICD-10-CM | POA: Diagnosis not present

## 2018-01-30 DIAGNOSIS — R3915 Urgency of urination: Secondary | ICD-10-CM | POA: Diagnosis not present

## 2018-01-30 DIAGNOSIS — I950 Idiopathic hypotension: Secondary | ICD-10-CM | POA: Diagnosis not present

## 2018-01-30 DIAGNOSIS — E1165 Type 2 diabetes mellitus with hyperglycemia: Secondary | ICD-10-CM | POA: Diagnosis not present

## 2018-01-30 DIAGNOSIS — F332 Major depressive disorder, recurrent severe without psychotic features: Secondary | ICD-10-CM | POA: Diagnosis not present

## 2018-01-30 DIAGNOSIS — N3000 Acute cystitis without hematuria: Secondary | ICD-10-CM | POA: Diagnosis not present

## 2018-02-01 ENCOUNTER — Other Ambulatory Visit: Payer: Self-pay

## 2018-02-01 NOTE — Patient Outreach (Signed)
[facility]) Care Management  02/01/2018  CHI GARLOW 05-Oct-1946 [id number]   Referral Date: 01/31/18 Referral Source: [facility] Referral Reason: Patient cannot name medications   Outreach Attempt 1 Spoke with patient he is able to verify HIPAA.  Patient acknowledges recent ER visit.  He states he went in for kidney stones and is still having some pain.  Patient recent saw PCP and is to see the urologist on 02-13-18.       Social: Patient lives in the home with wife who is supportive of patient.  Patient reports that he is independent with all aspects of care.     Conditions:  Patient has diabetes and some depression with new medications added recently.  Patient declines needing any disease education and support at this time.     Medications: Patient able to review medications and offers no questions with medications.  Offered [facility] to assist with medications but patient declines as he states his wife assists him with his medications.     Appointments: Patient saw primary care this week and has some medication changes.    Advanced Directives: Patient does have an advanced directive.    Consent:  Discussed [facility] Care Management Services with patient but he declined right now but is agreeable to receive letter and brochure.     Plan: RN CM will send letter and brochure.     Bary Leriche, RN, MSN [facility] Care Management Care Management Coordinator Direct Line [phone] Toll Free: [phone]  Fax: [phone]

## 2018-02-12 DIAGNOSIS — Z79899 Other long term (current) drug therapy: Secondary | ICD-10-CM | POA: Diagnosis not present

## 2018-02-12 DIAGNOSIS — R079 Chest pain, unspecified: Secondary | ICD-10-CM | POA: Diagnosis not present

## 2018-02-12 DIAGNOSIS — Z8673 Personal history of transient ischemic attack (TIA), and cerebral infarction without residual deficits: Secondary | ICD-10-CM | POA: Diagnosis not present

## 2018-02-12 DIAGNOSIS — K219 Gastro-esophageal reflux disease without esophagitis: Secondary | ICD-10-CM | POA: Diagnosis not present

## 2018-02-12 DIAGNOSIS — Z792 Long term (current) use of antibiotics: Secondary | ICD-10-CM | POA: Diagnosis not present

## 2018-02-12 DIAGNOSIS — R739 Hyperglycemia, unspecified: Secondary | ICD-10-CM | POA: Diagnosis not present

## 2018-02-12 DIAGNOSIS — F329 Major depressive disorder, single episode, unspecified: Secondary | ICD-10-CM | POA: Diagnosis not present

## 2018-02-12 DIAGNOSIS — Z955 Presence of coronary angioplasty implant and graft: Secondary | ICD-10-CM | POA: Diagnosis not present

## 2018-02-12 DIAGNOSIS — Z7984 Long term (current) use of oral hypoglycemic drugs: Secondary | ICD-10-CM | POA: Diagnosis not present

## 2018-02-12 DIAGNOSIS — N400 Enlarged prostate without lower urinary tract symptoms: Secondary | ICD-10-CM | POA: Diagnosis not present

## 2018-02-12 DIAGNOSIS — I2510 Atherosclerotic heart disease of native coronary artery without angina pectoris: Secondary | ICD-10-CM | POA: Diagnosis not present

## 2018-02-12 DIAGNOSIS — Z951 Presence of aortocoronary bypass graft: Secondary | ICD-10-CM | POA: Diagnosis not present

## 2018-02-12 DIAGNOSIS — E876 Hypokalemia: Secondary | ICD-10-CM | POA: Diagnosis not present

## 2018-02-12 DIAGNOSIS — E785 Hyperlipidemia, unspecified: Secondary | ICD-10-CM | POA: Diagnosis not present

## 2018-02-12 DIAGNOSIS — I252 Old myocardial infarction: Secondary | ICD-10-CM | POA: Diagnosis not present

## 2018-02-12 DIAGNOSIS — E1165 Type 2 diabetes mellitus with hyperglycemia: Secondary | ICD-10-CM | POA: Diagnosis not present

## 2018-02-12 DIAGNOSIS — E119 Type 2 diabetes mellitus without complications: Secondary | ICD-10-CM | POA: Diagnosis not present

## 2018-02-12 DIAGNOSIS — I10 Essential (primary) hypertension: Secondary | ICD-10-CM | POA: Diagnosis not present

## 2018-02-13 DIAGNOSIS — I2510 Atherosclerotic heart disease of native coronary artery without angina pectoris: Secondary | ICD-10-CM | POA: Diagnosis not present

## 2018-02-13 DIAGNOSIS — E876 Hypokalemia: Secondary | ICD-10-CM | POA: Diagnosis not present

## 2018-02-13 DIAGNOSIS — R739 Hyperglycemia, unspecified: Secondary | ICD-10-CM | POA: Diagnosis not present

## 2018-02-13 DIAGNOSIS — E119 Type 2 diabetes mellitus without complications: Secondary | ICD-10-CM | POA: Diagnosis not present

## 2018-02-13 DIAGNOSIS — R079 Chest pain, unspecified: Secondary | ICD-10-CM | POA: Diagnosis not present

## 2018-02-22 DIAGNOSIS — M1990 Unspecified osteoarthritis, unspecified site: Secondary | ICD-10-CM | POA: Diagnosis not present

## 2018-02-23 DIAGNOSIS — R2689 Other abnormalities of gait and mobility: Secondary | ICD-10-CM | POA: Diagnosis not present

## 2018-02-23 DIAGNOSIS — R29898 Other symptoms and signs involving the musculoskeletal system: Secondary | ICD-10-CM | POA: Diagnosis not present

## 2018-02-23 DIAGNOSIS — M450 Ankylosing spondylitis of multiple sites in spine: Secondary | ICD-10-CM | POA: Diagnosis not present

## 2018-02-23 DIAGNOSIS — M545 Low back pain: Secondary | ICD-10-CM | POA: Diagnosis not present

## 2018-02-27 DIAGNOSIS — M450 Ankylosing spondylitis of multiple sites in spine: Secondary | ICD-10-CM | POA: Diagnosis not present

## 2018-02-27 DIAGNOSIS — M545 Low back pain: Secondary | ICD-10-CM | POA: Diagnosis not present

## 2018-02-27 DIAGNOSIS — R29898 Other symptoms and signs involving the musculoskeletal system: Secondary | ICD-10-CM | POA: Diagnosis not present

## 2018-02-27 DIAGNOSIS — R2689 Other abnormalities of gait and mobility: Secondary | ICD-10-CM | POA: Diagnosis not present

## 2018-03-01 DIAGNOSIS — R29898 Other symptoms and signs involving the musculoskeletal system: Secondary | ICD-10-CM | POA: Diagnosis not present

## 2018-03-01 DIAGNOSIS — M450 Ankylosing spondylitis of multiple sites in spine: Secondary | ICD-10-CM | POA: Diagnosis not present

## 2018-03-01 DIAGNOSIS — R2689 Other abnormalities of gait and mobility: Secondary | ICD-10-CM | POA: Diagnosis not present

## 2018-03-01 DIAGNOSIS — M545 Low back pain: Secondary | ICD-10-CM | POA: Diagnosis not present

## 2018-03-06 DIAGNOSIS — J069 Acute upper respiratory infection, unspecified: Secondary | ICD-10-CM | POA: Diagnosis not present

## 2018-03-06 DIAGNOSIS — J209 Acute bronchitis, unspecified: Secondary | ICD-10-CM | POA: Diagnosis not present

## 2018-03-12 DIAGNOSIS — F332 Major depressive disorder, recurrent severe without psychotic features: Secondary | ICD-10-CM | POA: Insufficient documentation

## 2018-03-12 DIAGNOSIS — M545 Low back pain: Secondary | ICD-10-CM | POA: Diagnosis not present

## 2018-03-12 DIAGNOSIS — N3941 Urge incontinence: Secondary | ICD-10-CM | POA: Insufficient documentation

## 2018-03-12 DIAGNOSIS — E782 Mixed hyperlipidemia: Secondary | ICD-10-CM | POA: Diagnosis not present

## 2018-03-12 DIAGNOSIS — Z1159 Encounter for screening for other viral diseases: Secondary | ICD-10-CM | POA: Diagnosis not present

## 2018-03-12 DIAGNOSIS — Z23 Encounter for immunization: Secondary | ICD-10-CM | POA: Diagnosis not present

## 2018-03-12 DIAGNOSIS — M129 Arthropathy, unspecified: Secondary | ICD-10-CM | POA: Diagnosis not present

## 2018-03-12 DIAGNOSIS — E1165 Type 2 diabetes mellitus with hyperglycemia: Secondary | ICD-10-CM | POA: Diagnosis not present

## 2018-03-12 DIAGNOSIS — M450 Ankylosing spondylitis of multiple sites in spine: Secondary | ICD-10-CM | POA: Diagnosis not present

## 2018-03-12 DIAGNOSIS — R2689 Other abnormalities of gait and mobility: Secondary | ICD-10-CM | POA: Diagnosis not present

## 2018-03-12 DIAGNOSIS — R29898 Other symptoms and signs involving the musculoskeletal system: Secondary | ICD-10-CM | POA: Diagnosis not present

## 2018-03-13 DIAGNOSIS — E782 Mixed hyperlipidemia: Secondary | ICD-10-CM | POA: Insufficient documentation

## 2018-03-13 DIAGNOSIS — E1165 Type 2 diabetes mellitus with hyperglycemia: Secondary | ICD-10-CM | POA: Insufficient documentation

## 2018-03-14 DIAGNOSIS — R2689 Other abnormalities of gait and mobility: Secondary | ICD-10-CM | POA: Diagnosis not present

## 2018-03-14 DIAGNOSIS — R29898 Other symptoms and signs involving the musculoskeletal system: Secondary | ICD-10-CM | POA: Diagnosis not present

## 2018-03-14 DIAGNOSIS — M545 Low back pain: Secondary | ICD-10-CM | POA: Diagnosis not present

## 2018-03-14 DIAGNOSIS — M450 Ankylosing spondylitis of multiple sites in spine: Secondary | ICD-10-CM | POA: Diagnosis not present

## 2018-03-19 DIAGNOSIS — M450 Ankylosing spondylitis of multiple sites in spine: Secondary | ICD-10-CM | POA: Diagnosis not present

## 2018-03-19 DIAGNOSIS — M545 Low back pain: Secondary | ICD-10-CM | POA: Diagnosis not present

## 2018-03-19 DIAGNOSIS — R29898 Other symptoms and signs involving the musculoskeletal system: Secondary | ICD-10-CM | POA: Diagnosis not present

## 2018-03-19 DIAGNOSIS — R2689 Other abnormalities of gait and mobility: Secondary | ICD-10-CM | POA: Diagnosis not present

## 2018-03-22 DIAGNOSIS — M545 Low back pain: Secondary | ICD-10-CM | POA: Diagnosis not present

## 2018-03-22 DIAGNOSIS — R29898 Other symptoms and signs involving the musculoskeletal system: Secondary | ICD-10-CM | POA: Diagnosis not present

## 2018-03-22 DIAGNOSIS — M450 Ankylosing spondylitis of multiple sites in spine: Secondary | ICD-10-CM | POA: Diagnosis not present

## 2018-03-22 DIAGNOSIS — R2689 Other abnormalities of gait and mobility: Secondary | ICD-10-CM | POA: Diagnosis not present

## 2018-03-23 DIAGNOSIS — N3941 Urge incontinence: Secondary | ICD-10-CM | POA: Diagnosis not present

## 2018-03-23 DIAGNOSIS — R339 Retention of urine, unspecified: Secondary | ICD-10-CM | POA: Diagnosis not present

## 2018-03-23 DIAGNOSIS — E1165 Type 2 diabetes mellitus with hyperglycemia: Secondary | ICD-10-CM | POA: Diagnosis not present

## 2018-03-27 DIAGNOSIS — M450 Ankylosing spondylitis of multiple sites in spine: Secondary | ICD-10-CM | POA: Diagnosis not present

## 2018-03-27 DIAGNOSIS — M545 Low back pain: Secondary | ICD-10-CM | POA: Diagnosis not present

## 2018-03-27 DIAGNOSIS — R2689 Other abnormalities of gait and mobility: Secondary | ICD-10-CM | POA: Diagnosis not present

## 2018-03-27 DIAGNOSIS — R29898 Other symptoms and signs involving the musculoskeletal system: Secondary | ICD-10-CM | POA: Diagnosis not present

## 2018-03-29 DIAGNOSIS — M450 Ankylosing spondylitis of multiple sites in spine: Secondary | ICD-10-CM | POA: Diagnosis not present

## 2018-03-29 DIAGNOSIS — R29898 Other symptoms and signs involving the musculoskeletal system: Secondary | ICD-10-CM | POA: Diagnosis not present

## 2018-03-29 DIAGNOSIS — M545 Low back pain: Secondary | ICD-10-CM | POA: Diagnosis not present

## 2018-03-29 DIAGNOSIS — R2689 Other abnormalities of gait and mobility: Secondary | ICD-10-CM | POA: Diagnosis not present

## 2018-03-30 DIAGNOSIS — R109 Unspecified abdominal pain: Secondary | ICD-10-CM | POA: Diagnosis not present

## 2018-03-30 DIAGNOSIS — J029 Acute pharyngitis, unspecified: Secondary | ICD-10-CM | POA: Diagnosis not present

## 2018-03-30 DIAGNOSIS — R1084 Generalized abdominal pain: Secondary | ICD-10-CM | POA: Diagnosis not present

## 2018-04-03 DIAGNOSIS — R2689 Other abnormalities of gait and mobility: Secondary | ICD-10-CM | POA: Diagnosis not present

## 2018-04-03 DIAGNOSIS — R29898 Other symptoms and signs involving the musculoskeletal system: Secondary | ICD-10-CM | POA: Diagnosis not present

## 2018-04-03 DIAGNOSIS — M450 Ankylosing spondylitis of multiple sites in spine: Secondary | ICD-10-CM | POA: Diagnosis not present

## 2018-04-03 DIAGNOSIS — M545 Low back pain: Secondary | ICD-10-CM | POA: Diagnosis not present

## 2018-04-04 DIAGNOSIS — K146 Glossodynia: Secondary | ICD-10-CM | POA: Diagnosis not present

## 2018-04-04 DIAGNOSIS — I6349 Cerebral infarction due to embolism of other cerebral artery: Secondary | ICD-10-CM | POA: Diagnosis not present

## 2018-04-04 DIAGNOSIS — F0390 Unspecified dementia without behavioral disturbance: Secondary | ICD-10-CM | POA: Diagnosis not present

## 2018-04-04 DIAGNOSIS — J029 Acute pharyngitis, unspecified: Secondary | ICD-10-CM | POA: Diagnosis not present

## 2018-04-04 DIAGNOSIS — N401 Enlarged prostate with lower urinary tract symptoms: Secondary | ICD-10-CM | POA: Diagnosis not present

## 2018-04-04 DIAGNOSIS — R300 Dysuria: Secondary | ICD-10-CM | POA: Diagnosis not present

## 2018-04-05 DIAGNOSIS — M545 Low back pain: Secondary | ICD-10-CM | POA: Diagnosis not present

## 2018-04-05 DIAGNOSIS — R2689 Other abnormalities of gait and mobility: Secondary | ICD-10-CM | POA: Diagnosis not present

## 2018-04-05 DIAGNOSIS — R29898 Other symptoms and signs involving the musculoskeletal system: Secondary | ICD-10-CM | POA: Diagnosis not present

## 2018-04-05 DIAGNOSIS — M450 Ankylosing spondylitis of multiple sites in spine: Secondary | ICD-10-CM | POA: Diagnosis not present

## 2018-04-10 DIAGNOSIS — R2689 Other abnormalities of gait and mobility: Secondary | ICD-10-CM | POA: Diagnosis not present

## 2018-04-10 DIAGNOSIS — M545 Low back pain: Secondary | ICD-10-CM | POA: Diagnosis not present

## 2018-04-10 DIAGNOSIS — M450 Ankylosing spondylitis of multiple sites in spine: Secondary | ICD-10-CM | POA: Diagnosis not present

## 2018-04-10 DIAGNOSIS — R29898 Other symptoms and signs involving the musculoskeletal system: Secondary | ICD-10-CM | POA: Diagnosis not present

## 2018-04-12 DIAGNOSIS — M450 Ankylosing spondylitis of multiple sites in spine: Secondary | ICD-10-CM | POA: Diagnosis not present

## 2018-04-12 DIAGNOSIS — R29898 Other symptoms and signs involving the musculoskeletal system: Secondary | ICD-10-CM | POA: Diagnosis not present

## 2018-04-12 DIAGNOSIS — R2689 Other abnormalities of gait and mobility: Secondary | ICD-10-CM | POA: Diagnosis not present

## 2018-04-12 DIAGNOSIS — M545 Low back pain: Secondary | ICD-10-CM | POA: Diagnosis not present

## 2018-04-13 DIAGNOSIS — M450 Ankylosing spondylitis of multiple sites in spine: Secondary | ICD-10-CM | POA: Insufficient documentation

## 2018-04-13 DIAGNOSIS — G894 Chronic pain syndrome: Secondary | ICD-10-CM | POA: Insufficient documentation

## 2018-04-17 DIAGNOSIS — M450 Ankylosing spondylitis of multiple sites in spine: Secondary | ICD-10-CM | POA: Diagnosis not present

## 2018-04-17 DIAGNOSIS — R29898 Other symptoms and signs involving the musculoskeletal system: Secondary | ICD-10-CM | POA: Diagnosis not present

## 2018-04-17 DIAGNOSIS — R2689 Other abnormalities of gait and mobility: Secondary | ICD-10-CM | POA: Diagnosis not present

## 2018-04-17 DIAGNOSIS — M545 Low back pain: Secondary | ICD-10-CM | POA: Diagnosis not present

## 2018-04-19 DIAGNOSIS — M450 Ankylosing spondylitis of multiple sites in spine: Secondary | ICD-10-CM | POA: Diagnosis not present

## 2018-04-19 DIAGNOSIS — R2689 Other abnormalities of gait and mobility: Secondary | ICD-10-CM | POA: Diagnosis not present

## 2018-04-19 DIAGNOSIS — M545 Low back pain: Secondary | ICD-10-CM | POA: Diagnosis not present

## 2018-04-19 DIAGNOSIS — R29898 Other symptoms and signs involving the musculoskeletal system: Secondary | ICD-10-CM | POA: Diagnosis not present

## 2018-04-30 DIAGNOSIS — M545 Low back pain: Secondary | ICD-10-CM | POA: Diagnosis not present

## 2018-04-30 DIAGNOSIS — R29898 Other symptoms and signs involving the musculoskeletal system: Secondary | ICD-10-CM | POA: Diagnosis not present

## 2018-04-30 DIAGNOSIS — R2689 Other abnormalities of gait and mobility: Secondary | ICD-10-CM | POA: Diagnosis not present

## 2018-04-30 DIAGNOSIS — M450 Ankylosing spondylitis of multiple sites in spine: Secondary | ICD-10-CM | POA: Diagnosis not present

## 2018-05-07 DIAGNOSIS — G894 Chronic pain syndrome: Secondary | ICD-10-CM | POA: Diagnosis not present

## 2018-05-07 DIAGNOSIS — M542 Cervicalgia: Secondary | ICD-10-CM | POA: Diagnosis not present

## 2018-05-07 DIAGNOSIS — M450 Ankylosing spondylitis of multiple sites in spine: Secondary | ICD-10-CM | POA: Diagnosis not present

## 2018-05-07 DIAGNOSIS — M129 Arthropathy, unspecified: Secondary | ICD-10-CM | POA: Diagnosis not present

## 2018-05-08 DIAGNOSIS — R2689 Other abnormalities of gait and mobility: Secondary | ICD-10-CM | POA: Diagnosis not present

## 2018-05-08 DIAGNOSIS — M545 Low back pain: Secondary | ICD-10-CM | POA: Diagnosis not present

## 2018-05-08 DIAGNOSIS — M450 Ankylosing spondylitis of multiple sites in spine: Secondary | ICD-10-CM | POA: Diagnosis not present

## 2018-05-08 DIAGNOSIS — R29898 Other symptoms and signs involving the musculoskeletal system: Secondary | ICD-10-CM | POA: Diagnosis not present

## 2018-05-09 DIAGNOSIS — E785 Hyperlipidemia, unspecified: Secondary | ICD-10-CM | POA: Diagnosis not present

## 2018-05-09 DIAGNOSIS — I10 Essential (primary) hypertension: Secondary | ICD-10-CM | POA: Diagnosis not present

## 2018-05-09 DIAGNOSIS — R079 Chest pain, unspecified: Secondary | ICD-10-CM | POA: Diagnosis not present

## 2018-05-09 DIAGNOSIS — I2510 Atherosclerotic heart disease of native coronary artery without angina pectoris: Secondary | ICD-10-CM | POA: Diagnosis not present

## 2018-05-09 DIAGNOSIS — R531 Weakness: Secondary | ICD-10-CM | POA: Diagnosis not present

## 2018-05-09 DIAGNOSIS — J189 Pneumonia, unspecified organism: Secondary | ICD-10-CM

## 2018-05-09 DIAGNOSIS — N400 Enlarged prostate without lower urinary tract symptoms: Secondary | ICD-10-CM | POA: Diagnosis not present

## 2018-05-09 DIAGNOSIS — Z955 Presence of coronary angioplasty implant and graft: Secondary | ICD-10-CM | POA: Diagnosis not present

## 2018-05-09 DIAGNOSIS — E876 Hypokalemia: Secondary | ICD-10-CM | POA: Diagnosis not present

## 2018-05-09 DIAGNOSIS — R109 Unspecified abdominal pain: Secondary | ICD-10-CM | POA: Diagnosis not present

## 2018-05-09 DIAGNOSIS — I252 Old myocardial infarction: Secondary | ICD-10-CM | POA: Diagnosis not present

## 2018-05-09 DIAGNOSIS — Z8673 Personal history of transient ischemic attack (TIA), and cerebral infarction without residual deficits: Secondary | ICD-10-CM | POA: Diagnosis not present

## 2018-05-09 DIAGNOSIS — R51 Headache: Secondary | ICD-10-CM | POA: Diagnosis not present

## 2018-05-09 DIAGNOSIS — E119 Type 2 diabetes mellitus without complications: Secondary | ICD-10-CM | POA: Diagnosis not present

## 2018-05-09 DIAGNOSIS — K219 Gastro-esophageal reflux disease without esophagitis: Secondary | ICD-10-CM | POA: Diagnosis not present

## 2018-05-09 DIAGNOSIS — E871 Hypo-osmolality and hyponatremia: Secondary | ICD-10-CM | POA: Diagnosis not present

## 2018-05-09 DIAGNOSIS — Z79891 Long term (current) use of opiate analgesic: Secondary | ICD-10-CM | POA: Diagnosis not present

## 2018-05-09 DIAGNOSIS — R42 Dizziness and giddiness: Secondary | ICD-10-CM | POA: Diagnosis not present

## 2018-05-09 DIAGNOSIS — M1990 Unspecified osteoarthritis, unspecified site: Secondary | ICD-10-CM | POA: Diagnosis not present

## 2018-05-09 DIAGNOSIS — Z79899 Other long term (current) drug therapy: Secondary | ICD-10-CM | POA: Diagnosis not present

## 2018-05-09 DIAGNOSIS — N3000 Acute cystitis without hematuria: Secondary | ICD-10-CM | POA: Diagnosis not present

## 2018-05-09 DIAGNOSIS — D638 Anemia in other chronic diseases classified elsewhere: Secondary | ICD-10-CM | POA: Diagnosis not present

## 2018-05-09 DIAGNOSIS — Z7984 Long term (current) use of oral hypoglycemic drugs: Secondary | ICD-10-CM | POA: Diagnosis not present

## 2018-05-09 DIAGNOSIS — R918 Other nonspecific abnormal finding of lung field: Secondary | ICD-10-CM | POA: Diagnosis not present

## 2018-05-09 DIAGNOSIS — F329 Major depressive disorder, single episode, unspecified: Secondary | ICD-10-CM | POA: Diagnosis not present

## 2018-05-09 DIAGNOSIS — B9689 Other specified bacterial agents as the cause of diseases classified elsewhere: Secondary | ICD-10-CM | POA: Diagnosis not present

## 2018-05-09 DIAGNOSIS — J690 Pneumonitis due to inhalation of food and vomit: Secondary | ICD-10-CM | POA: Diagnosis not present

## 2018-05-09 DIAGNOSIS — I951 Orthostatic hypotension: Secondary | ICD-10-CM | POA: Diagnosis not present

## 2018-05-09 DIAGNOSIS — R1110 Vomiting, unspecified: Secondary | ICD-10-CM | POA: Diagnosis not present

## 2018-05-09 DIAGNOSIS — R112 Nausea with vomiting, unspecified: Secondary | ICD-10-CM | POA: Diagnosis not present

## 2018-05-15 ENCOUNTER — Other Ambulatory Visit: Payer: Self-pay

## 2018-05-15 NOTE — Patient Outreach (Signed)
[facility]) Care Management  05/15/2018  OLUWAJOMILOJU DELLAPORTA Nov 19, 1945 [id number]   Transition of care  Referral date: 05/15/18 Referral source: discharged from an inpatient admission from [facility] on    05/12/18. Insurance: [employer]  Transition of care will be completed by primary care provider office who will refer to [facility] care management if needed.   PLAN: RNCM will close patient due to patient being enrolled in an external program.   George Ina RN,BSN,CCM [facility] Telephonic  [phone]

## 2018-05-16 DIAGNOSIS — E876 Hypokalemia: Secondary | ICD-10-CM | POA: Diagnosis not present

## 2018-05-16 DIAGNOSIS — N390 Urinary tract infection, site not specified: Secondary | ICD-10-CM | POA: Diagnosis not present

## 2018-05-16 DIAGNOSIS — J690 Pneumonitis due to inhalation of food and vomit: Secondary | ICD-10-CM | POA: Diagnosis not present

## 2018-05-16 DIAGNOSIS — J181 Lobar pneumonia, unspecified organism: Secondary | ICD-10-CM | POA: Diagnosis not present

## 2018-05-18 DIAGNOSIS — I2129 ST elevation (STEMI) myocardial infarction involving other sites: Secondary | ICD-10-CM | POA: Diagnosis not present

## 2018-05-18 DIAGNOSIS — I491 Atrial premature depolarization: Secondary | ICD-10-CM | POA: Diagnosis not present

## 2018-05-31 DIAGNOSIS — N390 Urinary tract infection, site not specified: Secondary | ICD-10-CM

## 2018-05-31 HISTORY — DX: Urinary tract infection, site not specified: N39.0

## 2018-06-04 DIAGNOSIS — R531 Weakness: Secondary | ICD-10-CM | POA: Insufficient documentation

## 2018-06-04 DIAGNOSIS — Z7409 Other reduced mobility: Secondary | ICD-10-CM | POA: Insufficient documentation

## 2018-06-04 DIAGNOSIS — I951 Orthostatic hypotension: Secondary | ICD-10-CM | POA: Diagnosis not present

## 2018-06-05 ENCOUNTER — Encounter (HOSPITAL_COMMUNITY): Payer: Self-pay

## 2018-06-05 ENCOUNTER — Inpatient Hospital Stay (HOSPITAL_COMMUNITY)
Admission: EM | Admit: 2018-06-05 | Discharge: 2018-06-09 | DRG: 689 | Disposition: A | Discharge status: Home Health | Payer: PPO | Attending: Internal Medicine | Admitting: Internal Medicine

## 2018-06-05 ENCOUNTER — Other Ambulatory Visit: Payer: Self-pay

## 2018-06-05 ENCOUNTER — Emergency Department (HOSPITAL_COMMUNITY): Payer: PPO

## 2018-06-05 DIAGNOSIS — N390 Urinary tract infection, site not specified: Secondary | ICD-10-CM | POA: Diagnosis not present

## 2018-06-05 DIAGNOSIS — F329 Major depressive disorder, single episode, unspecified: Secondary | ICD-10-CM | POA: Diagnosis present

## 2018-06-05 DIAGNOSIS — A419 Sepsis, unspecified organism: Secondary | ICD-10-CM

## 2018-06-05 DIAGNOSIS — G9341 Metabolic encephalopathy: Secondary | ICD-10-CM | POA: Diagnosis not present

## 2018-06-05 DIAGNOSIS — I2510 Atherosclerotic heart disease of native coronary artery without angina pectoris: Secondary | ICD-10-CM | POA: Diagnosis present

## 2018-06-05 DIAGNOSIS — E7800 Pure hypercholesterolemia, unspecified: Secondary | ICD-10-CM | POA: Diagnosis not present

## 2018-06-05 DIAGNOSIS — E860 Dehydration: Secondary | ICD-10-CM | POA: Diagnosis not present

## 2018-06-05 DIAGNOSIS — Z881 Allergy status to other antibiotic agents status: Secondary | ICD-10-CM

## 2018-06-05 DIAGNOSIS — R627 Adult failure to thrive: Secondary | ICD-10-CM

## 2018-06-05 DIAGNOSIS — K5090 Crohn's disease, unspecified, without complications: Secondary | ICD-10-CM | POA: Diagnosis not present

## 2018-06-05 DIAGNOSIS — N3001 Acute cystitis with hematuria: Secondary | ICD-10-CM | POA: Diagnosis not present

## 2018-06-05 DIAGNOSIS — E119 Type 2 diabetes mellitus without complications: Secondary | ICD-10-CM | POA: Diagnosis not present

## 2018-06-05 DIAGNOSIS — E872 Acidosis: Secondary | ICD-10-CM | POA: Diagnosis present

## 2018-06-05 DIAGNOSIS — K219 Gastro-esophageal reflux disease without esophagitis: Secondary | ICD-10-CM | POA: Diagnosis present

## 2018-06-05 DIAGNOSIS — Z8673 Personal history of transient ischemic attack (TIA), and cerebral infarction without residual deficits: Secondary | ICD-10-CM

## 2018-06-05 DIAGNOSIS — Z7902 Long term (current) use of antithrombotics/antiplatelets: Secondary | ICD-10-CM

## 2018-06-05 DIAGNOSIS — Z808 Family history of malignant neoplasm of other organs or systems: Secondary | ICD-10-CM | POA: Diagnosis not present

## 2018-06-05 DIAGNOSIS — R4182 Altered mental status, unspecified: Secondary | ICD-10-CM | POA: Diagnosis not present

## 2018-06-05 DIAGNOSIS — R319 Hematuria, unspecified: Secondary | ICD-10-CM | POA: Diagnosis present

## 2018-06-05 DIAGNOSIS — R109 Unspecified abdominal pain: Secondary | ICD-10-CM | POA: Diagnosis not present

## 2018-06-05 DIAGNOSIS — R911 Solitary pulmonary nodule: Secondary | ICD-10-CM | POA: Diagnosis not present

## 2018-06-05 DIAGNOSIS — N400 Enlarged prostate without lower urinary tract symptoms: Secondary | ICD-10-CM | POA: Diagnosis present

## 2018-06-05 DIAGNOSIS — I700 Atherosclerosis of aorta: Secondary | ICD-10-CM

## 2018-06-05 DIAGNOSIS — I951 Orthostatic hypotension: Secondary | ICD-10-CM | POA: Diagnosis not present

## 2018-06-05 DIAGNOSIS — R05 Cough: Secondary | ICD-10-CM | POA: Diagnosis not present

## 2018-06-05 DIAGNOSIS — M1990 Unspecified osteoarthritis, unspecified site: Secondary | ICD-10-CM | POA: Diagnosis not present

## 2018-06-05 DIAGNOSIS — Z7984 Long term (current) use of oral hypoglycemic drugs: Secondary | ICD-10-CM | POA: Diagnosis not present

## 2018-06-05 DIAGNOSIS — N281 Cyst of kidney, acquired: Secondary | ICD-10-CM | POA: Diagnosis not present

## 2018-06-05 DIAGNOSIS — R531 Weakness: Secondary | ICD-10-CM | POA: Diagnosis not present

## 2018-06-05 DIAGNOSIS — F0150 Vascular dementia without behavioral disturbance: Secondary | ICD-10-CM | POA: Diagnosis present

## 2018-06-05 DIAGNOSIS — F32A Depression, unspecified: Secondary | ICD-10-CM | POA: Diagnosis present

## 2018-06-05 DIAGNOSIS — Z66 Do not resuscitate: Secondary | ICD-10-CM | POA: Diagnosis present

## 2018-06-05 DIAGNOSIS — Z87442 Personal history of urinary calculi: Secondary | ICD-10-CM

## 2018-06-05 DIAGNOSIS — R1110 Vomiting, unspecified: Secondary | ICD-10-CM | POA: Diagnosis not present

## 2018-06-05 DIAGNOSIS — J189 Pneumonia, unspecified organism: Secondary | ICD-10-CM

## 2018-06-05 HISTORY — DX: Atherosclerosis of aorta: I70.0

## 2018-06-05 HISTORY — DX: Urinary tract infection, site not specified: N39.0

## 2018-06-05 HISTORY — DX: Unspecified dementia, unspecified severity, without behavioral disturbance, psychotic disturbance, mood disturbance, and anxiety: F03.90

## 2018-06-05 LAB — COMPREHENSIVE METABOLIC PANEL
ALT: 11 U/L (ref 0–44)
AST: 13 U/L — ABNORMAL LOW (ref 15–41)
Albumin: 3.7 g/dL (ref 3.5–5.0)
Alkaline Phosphatase: 72 U/L (ref 38–126)
Anion gap: 15 (ref 5–15)
BUN: 14 mg/dL (ref 8–23)
CO2: 22 mmol/L (ref 22–32)
Calcium: 10.1 mg/dL (ref 8.9–10.3)
Chloride: 99 mmol/L (ref 98–111)
Creatinine, Ser: 0.82 mg/dL (ref 0.61–1.24)
GFR calc Af Amer: >60 mL/min (ref >60)
GFR calc non Af Amer: >60 mL/min (ref >60)
Glucose, Bld: 181 mg/dL — ABNORMAL HIGH (ref 70–99)
Potassium: 4 mmol/L (ref 3.5–5.1)
Sodium: 136 mmol/L (ref 135–145)
Total Bilirubin: 0.7 mg/dL (ref 0.3–1.2)
Total Protein: 6.8 g/dL (ref 6.5–8.1)

## 2018-06-05 LAB — URINALYSIS, ROUTINE W REFLEX MICROSCOPIC
Bilirubin Urine: NEGATIVE
Glucose, UA: NEGATIVE mg/dL
Ketones, ur: 15 mg/dL — AB
Nitrite: NEGATIVE
Protein, ur: 100 mg/dL — AB
Specific Gravity, Urine: >1.03 — ABNORMAL HIGH (ref 1.005–1.030)
pH: 5.5 (ref 5.0–8.0)

## 2018-06-05 LAB — I-STAT CG4 LACTIC ACID, ED
Lactic Acid, Venous: 2.24 mmol/L (ref 0.5–1.9)
Lactic Acid, Venous: 3.44 mmol/L (ref 0.5–1.9)

## 2018-06-05 LAB — CBC WITH DIFFERENTIAL/PLATELET
Abs Immature Granulocytes: 0.1 10*3/uL (ref 0.0–0.1)
Basophils Absolute: 0 10*3/uL (ref 0.0–0.1)
Basophils Relative: 0 %
Eosinophils Absolute: 0.5 10*3/uL (ref 0.0–0.7)
Eosinophils Relative: 4 %
HCT: 37.4 % — ABNORMAL LOW (ref 39.0–52.0)
Hemoglobin: 11.6 g/dL — ABNORMAL LOW (ref 13.0–17.0)
Immature Granulocytes: 1 %
Lymphocytes Relative: 13 %
Lymphs Abs: 1.5 10*3/uL (ref 0.7–4.0)
MCH: 26 pg (ref 26.0–34.0)
MCHC: 31 g/dL (ref 30.0–36.0)
MCV: 83.7 fL (ref 78.0–100.0)
Monocytes Absolute: 0.7 10*3/uL (ref 0.1–1.0)
Monocytes Relative: 6 %
Neutro Abs: 9 10*3/uL — ABNORMAL HIGH (ref 1.7–7.7)
Neutrophils Relative %: 76 %
Platelets: 246 10*3/uL (ref 150–400)
RBC: 4.47 MIL/uL (ref 4.22–5.81)
RDW: 15.4 % (ref 11.5–15.5)
WBC: 11.9 10*3/uL — ABNORMAL HIGH (ref 4.0–10.5)

## 2018-06-05 LAB — URINALYSIS, MICROSCOPIC (REFLEX): WBC, UA: >50 WBC/hpf (ref 0–5)

## 2018-06-05 MED ORDER — GI COCKTAIL ~~LOC~~
30.0000 mL | Freq: Once | ORAL | Status: AC
  Administered 2018-06-05: 30 mL via ORAL
  Filled 2018-06-05: qty 30

## 2018-06-05 MED ORDER — SODIUM CHLORIDE 0.9 % IV BOLUS
1000.0000 mL | Freq: Once | INTRAVENOUS | Status: AC
  Administered 2018-06-05: 1000 mL via INTRAVENOUS

## 2018-06-05 MED ORDER — PROCHLORPERAZINE EDISYLATE 10 MG/2ML IJ SOLN
10.0000 mg | Freq: Once | INTRAMUSCULAR | Status: AC
  Administered 2018-06-05: 10 mg via INTRAVENOUS
  Filled 2018-06-05: qty 2

## 2018-06-05 MED ORDER — SODIUM CHLORIDE 0.9 % IV BOLUS
1000.0000 mL | Freq: Once | INTRAVENOUS | Status: AC
  Administered 2018-06-06: 1000 mL via INTRAVENOUS

## 2018-06-05 MED ORDER — DIPHENHYDRAMINE HCL 50 MG/ML IJ SOLN
25.0000 mg | Freq: Once | INTRAMUSCULAR | Status: AC
  Administered 2018-06-05: 25 mg via INTRAVENOUS
  Filled 2018-06-05: qty 1

## 2018-06-05 MED ORDER — SODIUM CHLORIDE 0.9 % IV SOLN
2.0000 g | INTRAVENOUS | Status: DC
  Administered 2018-06-05 – 2018-06-07 (×3): 2 g via INTRAVENOUS
  Filled 2018-06-05 (×3): qty 20

## 2018-06-05 MED ORDER — LORAZEPAM 2 MG/ML IJ SOLN
INTRAMUSCULAR | Status: AC
  Filled 2018-06-05: qty 1

## 2018-06-05 NOTE — ED Triage Notes (Addendum)
Pt presents with 3-4 day h/o increased weakness, decreased appetite, urinary incontinence with dysuria.  Pt was treated for pneumonia, was doing well per daughter until 3 days ago.  Pt has been confused and tired.  Pt reports intermittent generalized abdominal pain with vomiting yesterday.  Pt has had hiccups x 7-8 days.

## 2018-06-05 NOTE — ED Notes (Signed)
Informed first nurse Asher Muir of I-stat lactic 2.24

## 2018-06-05 NOTE — ED Provider Notes (Signed)
Patient placed in Quick Look pathway, seen and evaluated   Chief Complaint: Generalized weakness, decreased appetite  HPI:   Patient presents for evaluation of multiple symptoms.  He was recently treated for pneumonia a few weeks ago with improvement in his cough but has had worsening cough over the past several days.  Cough is productive of clear sputum.  He denies significant shortness of breath or chest pains.  Over the past 3 to 4 days he has also had increased fatigue and generalized weakness, decreased appetite and increased confusion.  He has urinary incontinence at baseline but over the past few days developed dysuria.  No hematuria.  He notes generalized abdominal pressure but denies significant pain.  A few days ago he had multiple episodes of nonbloody nonbilious emesis.  No fevers or chills.  Daughter also states that he has had persistent hiccups for greater than 1 week.  Recent travel to [place], returned on 05/26/2018.  ROS: Positive for fatigue, weakness, decreased appetite, dysuria, cough, confusion, hiccups, nausea, vomiting Negative for syncope, hematuria, melena, hematochezia Physical Exam:   Gen: No distress  Neuro: Awake and Alert x4  Skin: Warm    Focused Exam: Heart rate and rhythm regular, lungs clear to auscultation.  Persistent hiccups on examination.  Abdomen is soft and nontender, no CVA tenderness.   Initiation of care has begun. The patient has been counseled on the process, plan, and necessity for staying for the completion/evaluation, and the remainder of the medical screening examination    Bennye Alm 06/05/18 1841    Little, Ambrose [place], MD 06/06/18 [id number]

## 2018-06-05 NOTE — ED Notes (Signed)
Offered to help patient change into dry clothes, patient refused and stated that he would prefer to wait till he goes home to change.

## 2018-06-06 ENCOUNTER — Other Ambulatory Visit: Payer: Self-pay

## 2018-06-06 ENCOUNTER — Emergency Department (HOSPITAL_COMMUNITY): Payer: PPO

## 2018-06-06 ENCOUNTER — Encounter (HOSPITAL_COMMUNITY): Payer: Self-pay | Admitting: Family Medicine

## 2018-06-06 DIAGNOSIS — I951 Orthostatic hypotension: Secondary | ICD-10-CM | POA: Diagnosis present

## 2018-06-06 DIAGNOSIS — G9341 Metabolic encephalopathy: Secondary | ICD-10-CM | POA: Diagnosis present

## 2018-06-06 DIAGNOSIS — N390 Urinary tract infection, site not specified: Secondary | ICD-10-CM | POA: Diagnosis present

## 2018-06-06 DIAGNOSIS — K5090 Crohn's disease, unspecified, without complications: Secondary | ICD-10-CM | POA: Diagnosis present

## 2018-06-06 DIAGNOSIS — E860 Dehydration: Secondary | ICD-10-CM

## 2018-06-06 DIAGNOSIS — K219 Gastro-esophageal reflux disease without esophagitis: Secondary | ICD-10-CM | POA: Diagnosis present

## 2018-06-06 DIAGNOSIS — Z7902 Long term (current) use of antithrombotics/antiplatelets: Secondary | ICD-10-CM | POA: Diagnosis not present

## 2018-06-06 DIAGNOSIS — N3001 Acute cystitis with hematuria: Secondary | ICD-10-CM | POA: Diagnosis not present

## 2018-06-06 DIAGNOSIS — E7800 Pure hypercholesterolemia, unspecified: Secondary | ICD-10-CM | POA: Diagnosis present

## 2018-06-06 DIAGNOSIS — Z7984 Long term (current) use of oral hypoglycemic drugs: Secondary | ICD-10-CM | POA: Diagnosis not present

## 2018-06-06 DIAGNOSIS — E119 Type 2 diabetes mellitus without complications: Secondary | ICD-10-CM

## 2018-06-06 DIAGNOSIS — Z881 Allergy status to other antibiotic agents status: Secondary | ICD-10-CM | POA: Diagnosis not present

## 2018-06-06 DIAGNOSIS — F32A Depression, unspecified: Secondary | ICD-10-CM | POA: Diagnosis present

## 2018-06-06 DIAGNOSIS — I639 Cerebral infarction, unspecified: Secondary | ICD-10-CM | POA: Insufficient documentation

## 2018-06-06 DIAGNOSIS — N281 Cyst of kidney, acquired: Secondary | ICD-10-CM | POA: Diagnosis present

## 2018-06-06 DIAGNOSIS — N400 Enlarged prostate without lower urinary tract symptoms: Secondary | ICD-10-CM | POA: Diagnosis present

## 2018-06-06 DIAGNOSIS — Z8673 Personal history of transient ischemic attack (TIA), and cerebral infarction without residual deficits: Secondary | ICD-10-CM | POA: Diagnosis not present

## 2018-06-06 DIAGNOSIS — M1990 Unspecified osteoarthritis, unspecified site: Secondary | ICD-10-CM | POA: Diagnosis present

## 2018-06-06 DIAGNOSIS — Z808 Family history of malignant neoplasm of other organs or systems: Secondary | ICD-10-CM | POA: Diagnosis not present

## 2018-06-06 DIAGNOSIS — F329 Major depressive disorder, single episode, unspecified: Secondary | ICD-10-CM | POA: Diagnosis present

## 2018-06-06 DIAGNOSIS — R627 Adult failure to thrive: Secondary | ICD-10-CM | POA: Diagnosis present

## 2018-06-06 DIAGNOSIS — R4182 Altered mental status, unspecified: Secondary | ICD-10-CM | POA: Diagnosis present

## 2018-06-06 DIAGNOSIS — R319 Hematuria, unspecified: Secondary | ICD-10-CM | POA: Diagnosis present

## 2018-06-06 DIAGNOSIS — I700 Atherosclerosis of aorta: Secondary | ICD-10-CM | POA: Diagnosis present

## 2018-06-06 DIAGNOSIS — R911 Solitary pulmonary nodule: Secondary | ICD-10-CM

## 2018-06-06 DIAGNOSIS — Z87442 Personal history of urinary calculi: Secondary | ICD-10-CM | POA: Diagnosis not present

## 2018-06-06 DIAGNOSIS — I2510 Atherosclerotic heart disease of native coronary artery without angina pectoris: Secondary | ICD-10-CM | POA: Diagnosis present

## 2018-06-06 DIAGNOSIS — E872 Acidosis: Secondary | ICD-10-CM | POA: Diagnosis present

## 2018-06-06 HISTORY — DX: Atherosclerosis of aorta: I70.0

## 2018-06-06 LAB — COMPREHENSIVE METABOLIC PANEL
ALT: 10 U/L (ref 0–44)
ANION GAP: 13 (ref 5–15)
AST: 10 U/L — ABNORMAL LOW (ref 15–41)
Albumin: 3 g/dL — ABNORMAL LOW (ref 3.5–5.0)
Alkaline Phosphatase: 55 U/L (ref 38–126)
BILIRUBIN TOTAL: 0.8 mg/dL (ref 0.3–1.2)
BUN: 8 mg/dL (ref 8–23)
CO2: 23 mmol/L (ref 22–32)
Calcium: 8.6 mg/dL — ABNORMAL LOW (ref 8.9–10.3)
Chloride: 106 mmol/L (ref 98–111)
Creatinine, Ser: 0.72 mg/dL (ref 0.61–1.24)
GFR calc Af Amer: >60 mL/min (ref >60)
GFR calc non Af Amer: >60 mL/min (ref >60)
GLUCOSE: 98 mg/dL (ref 70–99)
POTASSIUM: 3.8 mmol/L (ref 3.5–5.1)
SODIUM: 142 mmol/L (ref 135–145)
TOTAL PROTEIN: 5.6 g/dL — AB (ref 6.5–8.1)

## 2018-06-06 LAB — PROCALCITONIN

## 2018-06-06 LAB — STREP PNEUMONIAE URINARY ANTIGEN: Strep Pneumo Urinary Antigen: NEGATIVE

## 2018-06-06 LAB — GLUCOSE, CAPILLARY
GLUCOSE-CAPILLARY: 96 mg/dL (ref 70–99)
GLUCOSE-CAPILLARY: 145 mg/dL — AB (ref 70–99)
Glucose-Capillary: 129 mg/dL — ABNORMAL HIGH (ref 70–99)
Glucose-Capillary: 156 mg/dL — ABNORMAL HIGH (ref 70–99)

## 2018-06-06 LAB — LACTIC ACID, PLASMA: Lactic Acid, Venous: 0.8 mmol/L (ref 0.5–1.9)

## 2018-06-06 MED ORDER — MESALAMINE ER 250 MG PO CPCR
1500.0000 mg | ORAL_CAPSULE | Freq: Every day | ORAL | Status: DC
  Administered 2018-06-06 – 2018-06-09 (×4): 1500 mg via ORAL
  Filled 2018-06-06 (×4): qty 6

## 2018-06-06 MED ORDER — PANTOPRAZOLE SODIUM 40 MG PO TBEC
40.0000 mg | DELAYED_RELEASE_TABLET | Freq: Every day | ORAL | Status: DC
  Administered 2018-06-06 – 2018-06-09 (×4): 40 mg via ORAL
  Filled 2018-06-06 (×4): qty 1

## 2018-06-06 MED ORDER — NALOXONE HCL 0.4 MG/ML IJ SOLN: 0.4000 mg | Freq: Once | INTRAMUSCULAR | Status: DC

## 2018-06-06 MED ORDER — ENSURE ENLIVE PO LIQD
237.0000 mL | Freq: Two times a day (BID) | ORAL | Status: DC
  Administered 2018-06-07 – 2018-06-09 (×5): 237 mL via ORAL

## 2018-06-06 MED ORDER — SODIUM CHLORIDE 0.9 % IV SOLN
100.0000 mg | Freq: Two times a day (BID) | INTRAVENOUS | Status: DC
  Administered 2018-06-06: 100 mg via INTRAVENOUS
  Filled 2018-06-06 (×2): qty 100

## 2018-06-06 MED ORDER — HYDRALAZINE HCL 20 MG/ML IJ SOLN: 5.0000 mg | INTRAMUSCULAR | Status: DC | PRN

## 2018-06-06 MED ORDER — TEMAZEPAM 15 MG PO CAPS
30.0000 mg | ORAL_CAPSULE | Freq: Every evening | ORAL | Status: DC | PRN
  Administered 2018-06-07 – 2018-06-08 (×3): 30 mg via ORAL
  Filled 2018-06-06 (×4): qty 2

## 2018-06-06 MED ORDER — HYDROXYZINE HCL 50 MG/ML IM SOLN
25.0000 mg | Freq: Four times a day (QID) | INTRAMUSCULAR | Status: DC | PRN
  Filled 2018-06-06: qty 0.5

## 2018-06-06 MED ORDER — CLOPIDOGREL BISULFATE 75 MG PO TABS: 75.0000 mg | ORAL_TABLET | Freq: Every day | ORAL | Status: DC

## 2018-06-06 MED ORDER — INSULIN ASPART 100 UNIT/ML ~~LOC~~ SOLN
0.0000 [IU] | Freq: Three times a day (TID) | SUBCUTANEOUS | Status: DC
  Administered 2018-06-06: 1 [IU] via SUBCUTANEOUS
  Administered 2018-06-06 – 2018-06-07 (×2): 2 [IU] via SUBCUTANEOUS
  Administered 2018-06-07: 1 [IU] via SUBCUTANEOUS
  Administered 2018-06-07: 2 [IU] via SUBCUTANEOUS
  Administered 2018-06-08: 3 [IU] via SUBCUTANEOUS
  Administered 2018-06-08: 1 [IU] via SUBCUTANEOUS
  Administered 2018-06-08: 2 [IU] via SUBCUTANEOUS
  Administered 2018-06-09: 3 [IU] via SUBCUTANEOUS
  Administered 2018-06-09: 1 [IU] via SUBCUTANEOUS

## 2018-06-06 MED ORDER — ALBUTEROL SULFATE (2.5 MG/3ML) 0.083% IN NEBU: 2.5000 mg | INHALATION_SOLUTION | Freq: Four times a day (QID) | RESPIRATORY_TRACT | Status: DC | PRN

## 2018-06-06 MED ORDER — DOXYCYCLINE HYCLATE 100 MG PO TABS: 100.0000 mg | ORAL_TABLET | Freq: Once | ORAL | Status: DC

## 2018-06-06 MED ORDER — SODIUM CHLORIDE 0.9 % IV SOLN
INTRAVENOUS | Status: DC
  Administered 2018-06-06: 19:00:00 via INTRAVENOUS
  Administered 2018-06-06: 1000 mL via INTRAVENOUS
  Administered 2018-06-07: 06:00:00 via INTRAVENOUS

## 2018-06-06 MED ORDER — TAMSULOSIN HCL 0.4 MG PO CAPS
0.4000 mg | ORAL_CAPSULE | Freq: Every day | ORAL | Status: DC
  Administered 2018-06-06 – 2018-06-09 (×4): 0.4 mg via ORAL
  Filled 2018-06-06 (×4): qty 1

## 2018-06-06 MED ORDER — IOHEXOL 300 MG/ML  SOLN
100.0000 mL | Freq: Once | INTRAMUSCULAR | Status: AC | PRN
  Administered 2018-06-06: 100 mL via INTRAVENOUS

## 2018-06-06 MED ORDER — BUPROPION HCL ER (SR) 150 MG PO TB12
150.0000 mg | ORAL_TABLET | Freq: Two times a day (BID) | ORAL | Status: DC
  Administered 2018-06-06 – 2018-06-09 (×7): 150 mg via ORAL
  Filled 2018-06-06 (×7): qty 1

## 2018-06-06 MED ORDER — ENOXAPARIN SODIUM 40 MG/0.4ML ~~LOC~~ SOLN
40.0000 mg | SUBCUTANEOUS | Status: DC
  Administered 2018-06-06 – 2018-06-08 (×3): 40 mg via SUBCUTANEOUS
  Filled 2018-06-06 (×3): qty 0.4

## 2018-06-06 MED ORDER — INSULIN ASPART 100 UNIT/ML ~~LOC~~ SOLN: 0.0000 [IU] | Freq: Every day | SUBCUTANEOUS | Status: DC

## 2018-06-06 MED ORDER — CLOPIDOGREL BISULFATE 75 MG PO TABS
75.0000 mg | ORAL_TABLET | Freq: Every day | ORAL | Status: DC
  Administered 2018-06-06 – 2018-06-09 (×4): 75 mg via ORAL
  Filled 2018-06-06 (×4): qty 1

## 2018-06-06 MED ORDER — ACETAMINOPHEN 650 MG RE SUPP: 650.0000 mg | Freq: Four times a day (QID) | RECTAL | Status: DC | PRN

## 2018-06-06 MED ORDER — SODIUM CHLORIDE 0.9 % IV BOLUS
1000.0000 mL | Freq: Once | INTRAVENOUS | Status: AC
  Administered 2018-06-06: 1000 mL via INTRAVENOUS

## 2018-06-06 MED ORDER — ACETAMINOPHEN 325 MG PO TABS: 650.0000 mg | ORAL_TABLET | Freq: Four times a day (QID) | ORAL | Status: DC | PRN

## 2018-06-06 NOTE — H&P (Signed)
History and Physical  Frank Phillips WFU:[id number] DOB: 1946/04/12 DOA: 06/05/2018  PCP: Myrlene Broker, MD   Chief Complaint: pain with urination  HPI:  72 year old man PMH diabetes mellitus, hypertension, hyperlipidemia, stroke without residual symptoms, dementia, presented to the emergency department with generalized weakness, decreased appetite, poor oral intake, dysuria, increasing confusion.  Admitted for treatment of UTI.  History obtained from patient as well as daughter Frank Phillips at bedside.  Patient hospitalized in July at [facility] for pneumonia, was quite weak afterwards but did not improve to the point where he was walking with a cane.  Went with family to [place] returning 7/27, by the end of the trip he was much stronger.  Over the last several days to a week he has had decreased appetite, poor oral intake, generalized weakness and reportedly increased confusion without specific aggravating or alleviating factors.  His wife was diagnosed with glioblastoma within the last 2 weeks, complicating home life as the patient requires assistance with most ADLs and his wife is his primary caretaker.  Daughter has noticed flat affect and increased confusion over the last several days.  She wonders whether he may need to go to assisted living.  Wife had been taking care of patient's medications, however daughter found these medications jumbled and the patient did not complete his antibiotic course from pneumonia last month.  Seen in office by PCP 8/6, diagnosed with orthostatic hypotension and referred to the emergency department, patient did not go at that time.    Patient reports dysuria for several days, a few episodes of vomiting, no other specific symptoms reported.  ED Course: Afebrile, vital signs stable, treated with Benadryl, GI cocktail, Compazine, ceftriaxone, 3 L normal saline  Review of Systems:  Negative for fever, new visual changes, sore throat, rash, new muscle aches, chest  pain, SOB, bleeding, abdominal pain.  Positive for chronic arthritis  Past Medical History:  Diagnosis Date  . Anxiety   . Aortic atherosclerosis ([place]) 06/06/2018  . Arthritis   . Complication of anesthesia    " i HAD SOME KIND OF PROBLEM ,BUT I DONT REMEMBER WHAT IT WAS "  . Crohn disease ([place])   . Depression   . Diabetes ([place])   . Fall at home 04/06/2016  . High blood pressure   . High cholesterol   . Kidney stones   . Orthostatic hypotension 03/2016  . Stroke [facility])     Past Surgical History:  Procedure Laterality Date  . ANGIOPLASTY / STENTING FEMORAL    . APPENDECTOMY    . kidney stones    . TONSILLECTOMY    . TONSILLECTOMY AND ADENOIDECTOMY       reports that he has never smoked. He has never used smokeless tobacco. He reports that he does not drink alcohol or use drugs. Mobility: Uses a cane or Rollator  Allergies  Allergen Reactions  . Levaquin [Levofloxacin]     Out of mind    Family History  Problem Relation Age of Onset  . Cancer Sister        skin     Prior to Admission medications   Medication Sig Start Date End Date Taking? Authorizing Provider  acetaminophen (TYLENOL) 650 MG CR tablet Take 650 mg by mouth at bedtime as needed for pain.   Yes [provider]  albuterol (PROAIR HFA) 108 (90 BASE) MCG/ACT inhaler Inhale 2 puffs into the lungs every 6 (six) hours as needed for wheezing or shortness of breath.    Yes  [provider]  APRISO 0.375 g 24 hr capsule Take 1,500 mg by mouth daily. pain 03/29/16  Yes [provider]  buPROPion (WELLBUTRIN SR) 150 MG 12 hr tablet Take 150 mg by mouth 2 (two) times daily.  03/22/16  Yes [provider]  clopidogrel (PLAVIX) 75 MG tablet Take 75 mg by mouth daily with breakfast.   Yes [provider]  glipiZIDE (GLUCOTROL) 10 MG tablet Take 10 mg by mouth 2 (two) times daily before a meal.   Yes [provider]  metFORMIN (GLUCOPHAGE) 1000 MG tablet Take 1,000 mg  by mouth 2 (two) times daily with a meal.   Yes [provider]  midodrine (PROAMATINE) 5 MG tablet Take 1 tablet (5 mg total) by mouth 2 (two) times daily with a meal. 04/07/16  Yes Rai, Ripudeep K, MD  pantoprazole (PROTONIX) 40 MG tablet Take 40 mg by mouth daily.  11/24/14  Yes [provider]  ranitidine (ZANTAC) 300 MG tablet Take 300 mg by mouth at bedtime.  12/12/17  Yes [provider]  rOPINIRole (REQUIP) 0.5 MG tablet Take 0.5 mg by mouth 3 (three) times daily.  01/23/18  Yes [provider]  tamsulosin (FLOMAX) 0.4 MG CAPS capsule Take 0.4 mg by mouth daily.  12/02/14  Yes [provider]  temazepam (RESTORIL) 30 MG capsule Take 30 mg by mouth at bedtime as needed for sleep.  01/23/18  Yes [provider]  alfuzosin (UROXATRAL) 10 MG 24 hr tablet Take 1 tablet (10 mg total) by mouth daily with breakfast. Patient not taking: Reported on 06/05/2018 10/01/13   Tanda Rockers, MD  beclomethasone (QVAR) 40 MCG/ACT inhaler Take 2 puffs first thing in am and then another 2 puffs about 12 hours later. Patient not taking: Reported on 04/06/2016 12/04/13   Tanda Rockers, MD  oxyCODONE (OXY IR/ROXICODONE) 5 MG immediate release tablet Take 1 tablet by mouth every 4 (four) hours as needed for moderate pain.  05/07/18   [provider]  zolpidem (AMBIEN) 5 MG tablet Take 1 tablet (5 mg total) by mouth at bedtime as needed for sleep. Patient not taking: Reported on 02/01/2018 04/07/16   Mendel Corning, MD    Physical Exam: Vitals:   06/06/18 0656 06/06/18 0751  BP: (!) 140/58   Pulse: 62   Resp: (!) 21   Temp: 98 F (36.7 C) 98.7 F (37.1 C)  SpO2: 96%     Constitutional:   . Appears calm and comfortable lying in bed Eyes:  . pupils and irises appear normal . Normal lids  ENMT:  . Slightly hard of hearing . Lips appear normal Neck:  . neck appears normal, no masses . no thyromegaly Respiratory:  . CTA bilaterally, no w/r/r.   . Respiratory effort normal Cardiovascular:  . RRR, no m/r/g . No LE extremity edema   Abdomen:  . Abdomen appears normal; no tenderness or masses . No hernias noted . No hepatomegaly noted Musculoskeletal:  . Digits/nails BUE: no clubbing, cyanosis, petechiae, infection . RUE, LUE, RLE, LLE   o strength and tone normal, no atrophy, no abnormal movements o No tenderness, masses Skin:  . No rashes, lesions, ulcers . palpation of skin: no induration or nodules Psychiatric:  . Mental status o Mood appropriate, affect somewhat flat o Oriented to person, [facility] hospital, month, year  I have personally reviewed following labs and imaging studies  Labs:   CMP unremarkable  Lactic acid 2.24 > 3.44 >  0.8  Imaging studies:   CT abdomen and pelvis: Hazy opacities lung bases bilaterally, consider atypical infectious process.  No acute abnormality seen abdomen or pelvis.  CT head no acute abnormalities  Medical tests:   EKG independently reviewed: Sinus rhythm  Review and summation of old records:   8/5 office visit: Presented with weakness, nausea, vomiting, hiccups, "can no longer care for himself".  Wife has glioblastoma and can no longer care from him.  "He is very demanding and will not do anything for himself".  PCP diagnosis "weakness".  However exam documents 5/5 strength with normal range of motion.  Per office note 7/17 patient hospitalized [facility]/10-7/13 at [facility] for bibasilar pneumonia.  Hospitalized 03/2016 for orthostatic hypotension  Principal Problem:   UTI (urinary tract infection) Active Problems:   Diabetes mellitus without complication (HCC)   Acute metabolic encephalopathy   GERD (gastroesophageal reflux disease)   Depression   Lung nodule   Dehydration   FTT (failure to thrive) in adult   Aortic atherosclerosis (HCC)   Assessment/Plan Acute UTI with hematuria, symptomatic with dysuria, reported outpatient acute encephalopathy not currently  demonstrated --Continue ceftriaxone, follow-up culture data  Dehydration, failure to thrive, poor oral intake, likely multifactorial, suspect dominant factor wife's new diagnosis of glioblastoma.  Complicating diagnoses include depression, anxiety. Lactic acid has normalized with fluids, no signs or symptoms to suggest sepsis. --Continue IV fluids --Physical therapy/OT consultation --Social work consultation.  Suspect a good candidate for assisted living.  Diabetes mellitus type II treated with metformin, glipizide as an outpatient --Hold metformin while hospitalized.  Hold glipizide until diet stable. --Sliding scale insulin  Orthostatic hypotension as an outpatient.  Suspect this was dehydration.  Hold midodrine for now. --Hydrate.  Check orthostatics tomorrow.  GERD --Continue PPI  BPH --Continue Flomax  Crohn's disease --Appears stable.  Continue mesalamine.  Senile dementia  Aortic atherosclerosis seen on abdominal CT --No further evaluation planned  Small nodules at the right lung base measure up to 6 mm in size. --Non-contrast chest CT at 3-6 months is recommended. If the nodules are stable at time of repeat CT, then future CT at 18-24 months (from today's scan) is considered optional for low-risk patients, but is recommended for high-risk patients.  Severity of Illness: The appropriate patient status for this patient is INPATIENT. Inpatient status is judged to be reasonable and necessary in order to provide the required intensity of service to ensure the patient's safety. The patient's presenting symptoms, physical exam findings, and initial radiographic and laboratory data in the context of their chronic comorbidities is felt to place them at high risk for further clinical deterioration. Furthermore, it is not anticipated that the patient will be medically stable for discharge from the hospital within 2 midnights of admission.    * I certify that at the point of admission  it is my clinical judgment that the patient will require inpatient hospital care spanning beyond 2 midnights from the point of admission due to high intensity of service, high risk for further deterioration and high frequency of surveillance required.*   DVT prophylaxis: enoxaparin Code Status: DNR/DNI Family Communication: daughter Frank Phillips at baseline Consults called: none    Time spent: 60 minutes  Murray Hodgkins, MD  [facility] Direct contact: [phone] --Via [place]  --[email or web address]; password [email or web address]  7PM-7AM contact night coverage as above  06/06/2018, 9:42 AM

## 2018-06-06 NOTE — ED Provider Notes (Addendum)
[facility] EMERGENCY DEPARTMENT Provider Note   CSN: [id number] Arrival date & time: 06/05/18  1738     History   Chief Complaint Chief Complaint  Patient presents with  . Altered Mental Status    HPI Frank Phillips is a 72 y.o. male.  HPI 72 year old male past medical history significant for diabetes presents to the emergency department today with daughter at bedside for evaluation of altered mental status, generalized weakness, dysuria and cough.  According to daughter patient was treated 2 weeks ago for pneumonia with Augmentin however patient did not complete the course of Augmentin.  Reports that patient's wife was diagnosed with brain tumor last week and patient has significantly declined since then.  Reports the patient has had some confusion and repetitive speech.  States that he does have dementia but this is different.  States that he has had difficulties caring for himself and has not been eating.  Reports weight loss over the past week.  Also reports generalized weakness.  Reports chills but denies any known fevers.  Patient continues to complain of a productive cough that has improved over the past several days.  He denies any chest pain or shortness of breath.  Also reports urinary incontinence at baseline after having lithotripsy performed 6 months ago.  However patient has developed new dysuria.  Denies any hematuria.  Notes generalized abdominal pain.  Denies any change in bowel habits, hematuria, nausea or vomiting.  Daughter also reports that patient has had persistent hiccups for the past week.  Recent travel to [place] last week with family.  Reports decreased appetite.  Drinking plenty of fluids.  Seen by primary care doctor yesterday and diagnosed with orthostatic hypotension and told to come to the ED for further evaluation.  Pt denies any fever,  ha, vision changes, lightheadedness, dizziness, congestion, neck pain, cp, sob, change in bowel habits, melena,  hematochezia, lower extremity paresthesias.  Past Medical History:  Diagnosis Date  . Anxiety   . Arthritis   . Complication of anesthesia    " i HAD SOME KIND OF PROBLEM ,BUT I DONT REMEMBER WHAT IT WAS "  . Crohn disease (HCC)   . Depression   . Diabetes (HCC)   . Fall at home 04/06/2016  . High blood pressure   . High cholesterol   . Kidney stones   . Orthostatic hypotension 03/2016  . Stroke [facility])     Patient Active Problem List   Diagnosis Date Noted  . Orthostatic hypotension 04/06/2016  . Syncopal episodes 04/06/2016  . Bladder outlet obstruction 11/06/2013  . Dyspnea 09/17/2013  . Cough variant asthma 09/17/2013    Past Surgical History:  Procedure Laterality Date  . ANGIOPLASTY / STENTING FEMORAL    . APPENDECTOMY    . kidney stones    . TONSILLECTOMY    . TONSILLECTOMY AND ADENOIDECTOMY          Home Medications    Prior to Admission medications   Medication Sig Start Date End Date Taking? Authorizing Provider  acetaminophen (TYLENOL) 650 MG CR tablet Take 650 mg by mouth at bedtime as needed for pain.   Yes [provider]  albuterol (PROAIR HFA) 108 (90 BASE) MCG/ACT inhaler Inhale 2 puffs into the lungs every 6 (six) hours as needed for wheezing or shortness of breath.    Yes [provider]  APRISO 0.375 g 24 hr capsule Take 1,500 mg by mouth daily. pain 03/29/16  Yes [provider]  buPROPion (WELLBUTRIN SR) 150 MG 12 hr tablet Take 150 mg by mouth 2 (two) times daily.  03/22/16  Yes [provider]  clopidogrel (PLAVIX) 75 MG tablet Take 75 mg by mouth daily with breakfast.   Yes [provider]  glipiZIDE (GLUCOTROL) 10 MG tablet Take 10 mg by mouth 2 (two) times daily before a meal.   Yes [provider]  metFORMIN (GLUCOPHAGE) 1000 MG tablet Take 1,000 mg by mouth 2 (two) times daily with a meal.   Yes [provider]  midodrine (PROAMATINE) 5 MG tablet Take 1 tablet (5 mg total) by  mouth 2 (two) times daily with a meal. 04/07/16  Yes Rai, Ripudeep K, MD  pantoprazole (PROTONIX) 40 MG tablet Take 40 mg by mouth daily.  11/24/14  Yes [provider]  ranitidine (ZANTAC) 300 MG tablet Take 300 mg by mouth at bedtime.  12/12/17  Yes [provider]  rOPINIRole (REQUIP) 0.5 MG tablet Take 0.5 mg by mouth 3 (three) times daily.  01/23/18  Yes [provider]  tamsulosin (FLOMAX) 0.4 MG CAPS capsule Take 0.4 mg by mouth daily.  12/02/14  Yes [provider]  temazepam (RESTORIL) 30 MG capsule Take 30 mg by mouth at bedtime as needed for sleep.  01/23/18  Yes [provider]  alfuzosin (UROXATRAL) 10 MG 24 hr tablet Take 1 tablet (10 mg total) by mouth daily with breakfast. Patient not taking: Reported on 06/05/2018 10/01/13   Nyoka Cowden, MD  beclomethasone (QVAR) 40 MCG/ACT inhaler Take 2 puffs first thing in am and then another 2 puffs about 12 hours later. Patient not taking: Reported on 04/06/2016 12/04/13   Nyoka Cowden, MD  oxyCODONE (OXY IR/ROXICODONE) 5 MG immediate release tablet Take 1 tablet by mouth every 4 (four) hours as needed for moderate pain.  05/07/18   [provider]  zolpidem (AMBIEN) 5 MG tablet Take 1 tablet (5 mg total) by mouth at bedtime as needed for sleep. Patient not taking: Reported on 02/01/2018 04/07/16   Cathren Harsh, MD    Family History Family History  Problem Relation Age of Onset  . Cancer Sister        skin    Social History Social History   Tobacco Use  . Smoking status: Never Smoker  . Smokeless tobacco: Never Used  Substance Use Topics  . Alcohol use: No  . Drug use: No     Allergies   Levaquin [levofloxacin]   Review of Systems Review of Systems  All other systems reviewed and are negative.    Physical Exam Updated Vital Signs BP (!) 163/68   Pulse 66   Temp 98.7 F (37.1 C) (Rectal)   Resp (!) 22   Ht 5\' 10"  (1.778 m)   Wt 70.8 kg (156 lb)   SpO2 96%   BMI 22.38  kg/m   Physical Exam  Constitutional: He is oriented to person, place, and time. He appears well-developed and well-nourished.  Non-toxic appearance. No distress.  Patient did have urine soaked pants.  HENT:  Head: Normocephalic and atraumatic.  Mouth/Throat: Oropharynx is clear and moist.  Eyes: Pupils are equal, round, and reactive to light. Conjunctivae are normal. Right eye exhibits no discharge. Left eye exhibits no discharge.  Neck: Normal range of motion. Neck supple.  Cardiovascular: Normal rate, regular rhythm, normal heart sounds and intact distal pulses. Exam reveals no gallop and no friction rub.  No murmur heard. Pulmonary/Chest: Effort normal and  breath sounds normal. No stridor. No respiratory distress. He has no wheezes. He has no rales. He exhibits no tenderness.  Abdominal: Soft. Bowel sounds are normal. He exhibits no distension. There is no tenderness. There is no rebound and no guarding.  Musculoskeletal: Normal range of motion. He exhibits no tenderness.  Lymphadenopathy:    He has no cervical adenopathy.  Neurological: He is alert and oriented to person, place, and time.  Patient is alert and oriented x3.  Speech is clear.  Follows commands properly.  Skin: Skin is warm and dry. Capillary refill takes less than 2 seconds. No rash noted.  Psychiatric: His behavior is normal. Judgment and thought content normal.  Nursing note and vitals reviewed.    ED Treatments / Results  Labs (all labs ordered are listed, but only abnormal results are displayed) Labs Reviewed  COMPREHENSIVE METABOLIC PANEL - Abnormal; Notable for the following components:      Result Value   Glucose, Bld 181 (*)    AST 13 (*)    All other components within normal limits  CBC WITH DIFFERENTIAL/PLATELET - Abnormal; Notable for the following components:   WBC 11.9 (*)    Hemoglobin 11.6 (*)    HCT 37.4 (*)    Neutro Abs 9.0 (*)    All other components within normal limits  URINALYSIS,  ROUTINE W REFLEX MICROSCOPIC - Abnormal; Notable for the following components:   APPearance TURBID (*)    Specific Gravity, Urine >1.030 (*)    Hgb urine dipstick LARGE (*)    Ketones, ur 15 (*)    Protein, ur 100 (*)    Leukocytes, UA MODERATE (*)    All other components within normal limits  URINALYSIS, MICROSCOPIC (REFLEX) - Abnormal; Notable for the following components:   Bacteria, UA RARE (*)    All other components within normal limits  I-STAT CG4 LACTIC ACID, ED - Abnormal; Notable for the following components:   Lactic Acid, Venous 2.24 (*)    All other components within normal limits  I-STAT CG4 LACTIC ACID, ED - Abnormal; Notable for the following components:   Lactic Acid, Venous 3.44 (*)    All other components within normal limits  CULTURE, BLOOD (ROUTINE X 2)  CULTURE, BLOOD (ROUTINE X 2)  URINE CULTURE    EKG EKG Interpretation  Date/Time:  Tuesday June 05 2018 22:53:40 EDT Ventricular Rate:  65 PR Interval:    QRS Duration: 75 QT Interval:  507 QTC Calculation: 528 R Axis:   54 Text Interpretation:  Sinus or ectopic atrial rhythm Abnormal R-wave progression, early transition Prolonged QT interval Confirmed by Ross Marcus ([id number]) on 06/05/2018 11:42:39 PM   Radiology Dg Chest 2 View  Result Date: 06/05/2018 CLINICAL DATA:  Cough for 3-4 days EXAM: CHEST - 2 VIEW COMPARISON:  None. FINDINGS: Normal heart size and mediastinal contours. No acute infiltrate or edema. No effusion or pneumothorax. No acute osseous findings. IMPRESSION: Negative chest. Electronically Signed   By: Marnee Spring M.D.   On: 06/05/2018 19:16   Ct Head Wo Contrast  Result Date: 06/06/2018 CLINICAL DATA:  3-4 days of increased weakness, decreased appetite and urinary incontinence with EXAM: CT HEAD WITHOUT CONTRAST TECHNIQUE: Contiguous axial images were obtained from the base of the skull through the vertex without intravenous contrast. COMPARISON:  None. FINDINGS: Brain: Stable  superficial and central atrophy. No acute intracranial hemorrhage, midline shift, mass effect or edema. No large vascular territory infarct. No hydrocephalus. Chronic right-sided lacunar infarcts near the  genu of the right internal capsule with similar stable lacunar infarcts involving right external capsule. Chronic periventricular white matter hypodensities consistent with small vessel ischemia. Vascular: Dolichoectasia and atherosclerotic calcifications at the vertebrobasilar junction as before with calcified plaque, unchanged in appearance. No hyperdense vessel sign. Skull: Intact Sinuses/Orbits: Partially included soft tissue density along the lateral wall of the right maxillary sinus likely representing small mucous retention cyst. Mild right maxillary sinus wall thickening likely reflective of chronic sinusitis. Otherwise, paranasal sinuses appear clear. The orbits and globes are symmetric in appearance. Other: Mastoids are clear. IMPRESSION: Chronic white matter small vessel ischemic disease without acute intracranial abnormality. Electronically Signed   By: Tollie Eth M.D.   On: 06/06/2018 01:17   Ct Abdomen Pelvis W Contrast  Result Date: 06/06/2018 CLINICAL DATA:  Acute onset of generalized weakness and decreased appetite. Urinary incontinence and dysuria. Confusion. Generalized abdominal pain and vomiting. EXAM: CT ABDOMEN AND PELVIS WITH CONTRAST TECHNIQUE: Multidetector CT imaging of the abdomen and pelvis was performed using the standard protocol following bolus administration of intravenous contrast. CONTRAST:  OMNIPAQUE IOHEXOL 300 MG/ML  SOLN COMPARISON:  None. FINDINGS: Lower chest: Small nodules at the right lung base measure up to 6 mm in size. Minimal hazy opacities are noted at the lung bases bilaterally, which may reflect an atypical infectious process. Diffuse coronary artery calcifications are seen. Hepatobiliary: The liver is unremarkable in appearance. The gallbladder is  unremarkable in appearance. The common bile duct remains normal in caliber. Pancreas: The pancreas is within normal limits. Spleen: The spleen is unremarkable in appearance. Adrenals/Urinary Tract: The adrenal glands are unremarkable in appearance. Left renal cysts are noted. Nonspecific perinephric stranding is noted bilaterally. There is no evidence of hydronephrosis. No renal or ureteral stones are identified. Stomach/Bowel: The stomach is unremarkable in appearance. The small bowel is within normal limits. The patient is status post appendectomy. The colon is unremarkable in appearance. Vascular/Lymphatic: Scattered calcification is seen along the abdominal aorta and its branches. The abdominal aorta is otherwise grossly unremarkable. The inferior vena cava is grossly unremarkable. No retroperitoneal lymphadenopathy is seen. No pelvic sidewall lymphadenopathy is identified. Reproductive: The bladder is mildly distended and grossly unremarkable. The prostate is borderline normal in size, with minimal calcification. Other: No additional soft tissue abnormalities are seen. Musculoskeletal: No acute osseous abnormalities are identified. The visualized musculature is unremarkable in appearance. IMPRESSION: 1. Minimal hazy opacities at the lung bases bilaterally, which may reflect an atypical infectious process. Would correlate with the patient's symptoms. 2. No acute abnormality seen within the abdomen or pelvis. 3. Small nodules at the right lung base measure up to 6 mm in size. Non-contrast chest CT at 3-6 months is recommended. If the nodules are stable at time of repeat CT, then future CT at 18-24 months (from today's scan) is considered optional for low-risk patients, but is recommended for high-risk patients. This recommendation follows the consensus statement: Guidelines for Management of Incidental Pulmonary Nodules Detected on CT Images: From the Fleischner Society 2017; Radiology 2017; 284:228-243. 4.  Diffuse coronary artery calcifications seen. 5. Left renal cysts noted. Aortic Atherosclerosis (ICD10-I70.0). Electronically Signed   By: Roanna Raider M.D.   On: 06/06/2018 01:16    Procedures .Critical Care Performed by: Rise Mu, PA-C Authorized by: Rise Mu, PA-C   Critical care provider statement:    Critical care time (minutes):  45   Critical care was necessary to treat or prevent imminent or life-threatening deterioration of the following conditions:  Sepsis   Critical care was time spent personally by me on the following activities:  Discussions with consultants, evaluation of patient's response to treatment, examination of patient, ordering and performing treatments and interventions, ordering and review of laboratory studies, ordering and review of radiographic studies, pulse oximetry, re-evaluation of patient's condition, obtaining history from patient or surrogate, review of old charts, development of treatment plan with patient or surrogate and discussions with primary provider   (including critical care time)  Medications Ordered in ED Medications  cefTRIAXone (ROCEPHIN) 2 g in sodium chloride 0.9 % 100 mL IVPB (0 g Intravenous Stopped 06/06/18 0001)  doxycycline (VIBRA-TABS) tablet 100 mg (has no administration in time range)  gi cocktail (Maalox,Lidocaine,Donnatal) (30 mLs Oral Given 06/05/18 2321)  prochlorperazine (COMPAZINE) injection 10 mg (10 mg Intravenous Given 06/05/18 2319)  sodium chloride 0.9 % bolus 1,000 mL (0 mLs Intravenous Stopped 06/06/18 0043)  sodium chloride 0.9 % bolus 1,000 mL (0 mLs Intravenous Stopped 06/06/18 0045)  diphenhydrAMINE (BENADRYL) injection 25 mg (25 mg Intravenous Given 06/05/18 2347)  iohexol (OMNIPAQUE) 300 MG/ML solution 100 mL (100 mLs Intravenous Contrast Given 06/06/18 0050)     Initial Impression / Assessment and Plan / ED Course  I have reviewed the triage vital signs and the nursing notes.  Pertinent labs &  imaging results that were available during my care of the patient were reviewed by me and considered in my medical decision making (see chart for details).     Presents to the ED for evaluation of cough, urinary symptoms and confusion.  Patient afebrile in the ED.  No significant hypotension is noted.  Lactic acid was elevated at 2.4 increased to 3.4 on my examination.  Patient has a mild leukocytosis of 11,000.  UA shows signs of infection including RBCs, leukocytes and rare bacteria.  Will treat with Rocephin.  Hemoglobin at patient's baseline.  Normal kidney function.  Patient is alert oriented x3 however mild confusion noted.  CT head was reassuring.  Chest x-ray showed no signs of acute infection.  CT abdomen pelvis was performed;  IMPRESSION:  1. Minimal hazy opacities at the lung bases bilaterally, which may  reflect an atypical infectious process. Would correlate with the  patient's symptoms.  2. No acute abnormality seen within the abdomen or pelvis.  3. Small nodules at the right lung base measure up to 6 mm in size.  Non-contrast chest CT at 3-6 months is recommended. If the nodules  are stable at time of repeat CT, then future CT at 18-24 months  (from today's scan) is considered optional for low-risk patients,  but is recommended for high-risk patients. This recommendation  follows the consensus statement: Guidelines for Management of  Incidental Pulmonary Nodules Detected on CT Images: From the  Fleischner Society 2017; Radiology 2017; 284:228-243.  4. Diffuse coronary artery calcifications seen.  5. Left renal cysts noted.   Code sepsis was initiated.  Blood cultures were obtained prior to antibiotic initiation.  We will add on doxycycline given concern for possible pneumonia.  Spoke with Dr. Youlanda Roys with hospital medicine who agrees to admission.   Seen by my attending who is agreeable with the above plan.     Final Clinical Impressions(s) / ED Diagnoses   Final diagnoses:   Sepsis, due to unspecified organism [facility])  Community acquired pneumonia, unspecified laterality  Urinary tract infection with hematuria, site unspecified    ED Discharge Orders    None       Rise Mu,  PA-C 06/06/18 1610    Shon Baton, MD 06/08/18 9604    Rise Mu, PA-C 06/29/18 2240    Shon Baton, MD 07/01/18 2259

## 2018-06-06 NOTE — Care Management (Signed)
This is a no charge note  Pending admission per PA, Frank Phillips  72 year old male with past medical history of hypertension, diabetes mellitus, stroke, GERD, depression with anxiety, Crohn's disease, BPH, who presents with altered mental status, dysuria, urinary incontinence, generalized weakness, nausea, vomiting he coughs.  Patient was diagnosed with pneumonia 2 weeks ago, but did not complete Augmentin treatment.  Patient was found to have positive urinalysis with moderate amount of leukocyte, WBC> 50, rare bacteria, and turbid appearance, WBC 11.9, electrolytes renal function okay, lactic acid 2.24, 3.44, temperature normal, no tachycardia, has tachypnea, oxygen saturation 96% on room air, negative chest x-ray, negative CT head for acute intracranial abnormalities. Started Rocephin and doxycycline. Pt has pinpoint pupile-->will give 0.4 mg of Narcan. Patient is admitted to telemetry bed as inpatient.  Admission order was put in, but I do not have time to complete HPI.  CT-Abdomen/pelvis for acute is negative intra-abdominal issues, but showed minimal hazy opacities at the lung bases bilaterally,   [name], MD  [facility] Pager [phone]  If 7PM-7AM, please contact night-coverage [email or web address] Password [facility] 06/06/2018, 5:57 AM

## 2018-06-06 NOTE — Evaluation (Addendum)
Physical Therapy Evaluation Patient Details Name: Frank Phillips MRN: [id number] DOB: Sep 20, 1946 Today's Date: 06/06/2018   History of Present Illness  Patient is a 72 y/o male admitted with AMS, generalized weakness, Acute UTI with hematuria on 06/05/18. Negative chest x-ray, negative CT head for acute intracranial abnormalities. Patient with a PMH significant for hypertension, diabetes mellitus, stroke, GERD, depression with anxiety, Crohn's disease, BPH.    Clinical Impression  Mr. Huelsmann is a very pleasant 72 y/o male admitted with the above listed diagnosis. Patient reports that prior to admission he lived at home with his wife - was starting to require assist for mobility due to progressive weakness. Patient tearful through exam regarding wife's recent diagnosis. Today patient requiring Min A to Min guard for functional mobility with good hallway ambulation with RW. Will recommend [facility] services and supervision at discharge to continue to progress safe and independent functional mobility in the home environment. Will follow acutely.  Of note, orthostatic vitals taken: Supine : 153/72 Seated: 148/76 Standing: 150/77    Follow Up Recommendations Home health PT;Supervision - Intermittent;Supervision for mobility/OOB    Equipment Recommendations  None recommended by PT    Recommendations for Other Services       Precautions / Restrictions Precautions Precautions: Fall Restrictions Weight Bearing Restrictions: No      Mobility  Bed Mobility Overal bed mobility: Needs Assistance Bed Mobility: Supine to Sit     Supine to sit: Min guard     General bed mobility comments: for safety  Transfers Overall transfer level: Needs assistance Equipment used: Standard walker Transfers: Sit to/from Stand Sit to Stand: Min assist         General transfer comment: Min A to power up from bedside (low)  Ambulation/Gait Ambulation/Gait assistance: Min guard Gait Distance (Feet): 200  Feet Assistive device: Rolling walker (2 wheeled) Gait Pattern/deviations: Step-through pattern;Decreased stride length;Trunk flexed Gait velocity: decreased   General Gait Details: slow pace of gait; no LOB or overt instability; general min guard for safety  Stairs            Wheelchair Mobility    Modified Rankin (Stroke Patients Only)       Balance Overall balance assessment: Mild deficits observed, not formally tested                                           Pertinent Vitals/Pain Pain Assessment: No/denies pain    Home Living Family/patient expects to be discharged to:: Private residence Living Arrangements: Spouse/significant other Available Help at Discharge: Family;Available 24 hours/day Type of Home: House Home Access: Stairs to enter Entrance Stairs-Rails: None Entrance Stairs-Number of Steps: 1 Home Layout: One level Home Equipment: Clinical cytogeneticist - 2 wheels;Cane - single point      Prior Function Level of Independence: Independent with assistive device(s)         Comments: per patient, he used RW/SPC as he started to become weak; wife would perform household chores with patient leading a fairly sedentary lifestyle. Wife recent diagnosed with brain tumor with patient tearful regarding this. Has 3 grown children who he reports can help at home     Hand Dominance        Extremity/Trunk Assessment        Lower Extremity Assessment Lower Extremity Assessment: Generalized weakness    Cervical / Trunk Assessment Cervical / Trunk Assessment: Kyphotic(forward head)  Communication   Communication: No difficulties  Cognition Arousal/Alertness: Awake/alert Behavior During Therapy: WFL for tasks assessed/performed Overall Cognitive Status: No family/caregiver present to determine baseline cognitive functioning                                 General Comments: able to state PLOF and current health status  concerns. Does not need redirection      General Comments      Exercises     Assessment/Plan    PT Assessment Patient needs continued PT services  PT Problem List Decreased strength;Decreased activity tolerance;Decreased balance;Decreased mobility;Decreased knowledge of use of DME;Decreased safety awareness       PT Treatment Interventions DME instruction;Gait training;Stair training;Functional mobility training;Therapeutic activities;Therapeutic exercise;Balance training;Patient/family education    PT Goals (Current goals can be found in the Care Plan section)  Acute Rehab PT Goals Patient Stated Goal: return home to wife PT Goal Formulation: With patient Time For Goal Achievement: 06/20/18 Potential to Achieve Goals: Good    Frequency Min 3X/week   Barriers to discharge        Co-evaluation               AM-PAC PT "6 Clicks" Daily Activity  Outcome Measure Difficulty turning over in bed (including adjusting bedclothes, sheets and blankets)?: A Little Difficulty moving from lying on back to sitting on the side of the bed? : A Lot Difficulty sitting down on and standing up from a chair with arms (e.g., wheelchair, bedside commode, etc,.)?: Unable Help needed moving to and from a bed to chair (including a wheelchair)?: A Little Help needed walking in hospital room?: A Little Help needed climbing 3-5 steps with a railing? : A Lot 6 Click Score: 14    End of Session Equipment Utilized During Treatment: Gait belt Activity Tolerance: Patient tolerated treatment well Patient left: in chair;with call bell/phone within reach;with chair alarm set Nurse Communication: Mobility status PT Visit Diagnosis: Unsteadiness on feet (R26.81);Other abnormalities of gait and mobility (R26.89);Muscle weakness (generalized) (M62.81)    Time: 3833-3832 PT Time Calculation (min) (ACUTE ONLY): 27 min   Charges:   PT Evaluation $PT Eval Moderate Complexity: 1 Mod PT  Treatments $Gait Training: 8-22 mins        Lanney Gins, PT, DPT 06/06/18 1:13 PM Pager: [phone]

## 2018-06-06 NOTE — Evaluation (Signed)
Occupational Therapy Evaluation Patient Details Name: Frank Phillips MRN: [id number] DOB: 1946/02/20 Today's Date: 06/06/2018    History of Present Illness Patient is a 72 y/o male admitted with AMS, generalized weakness, Acute UTI with hematuria on 06/05/18. Negative chest x-ray, negative CT head for acute intracranial abnormalities. Patient with a PMH significant for hypertension, diabetes mellitus, stroke, GERD, depression with anxiety, Crohn's disease, BPH.   Clinical Impression   This 72 yo male admitted with above presents to acute OT with mild balance deficits thus affecting safety with basic ADLs. He will benefit from one more session of acute OT before D/C without need for follow up.     Follow Up Recommendations  No OT follow up;Supervision/Assistance - 24 hour    Equipment Recommendations  None recommended by OT       Precautions / Restrictions Precautions Precautions: Fall Restrictions Weight Bearing Restrictions: No      Mobility Bed Mobility Overal bed mobility: Independent Bed Mobility: Supine to Sit     Supine to sit: Min guard     General bed mobility comments: for safety  Transfers Overall transfer level: Needs assistance Equipment used: Rolling walker (2 wheeled) Transfers: Sit to/from Stand Sit to Stand: Min guard         General transfer comment: from recliner    Balance Overall balance assessment: Mild deficits observed, not formally tested                                         ADL either performed or assessed with clinical judgement   ADL Overall ADL's : Needs assistance/impaired Eating/Feeding: Independent;Sitting   Grooming: Set up;Sitting   Upper Body Bathing: Set up;Supervision/ safety;Sitting   Lower Body Bathing: Min guard;Sit to/from stand   Upper Body Dressing : Set up;Supervision/safety;Sitting   Lower Body Dressing: Min guard;Sit to/from stand   Toilet Transfer: Min guard;Ambulation;RW;Regular  Toilet;Grab bars   Toileting- Clothing Manipulation and Hygiene: Min guard;Sit to/from stand               Vision Patient Visual Report: No change from baseline              Pertinent Vitals/Pain Pain Assessment: No/denies pain     Hand Dominance Right   Extremity/Trunk Assessment Upper Extremity Assessment Upper Extremity Assessment: Overall WFL for tasks assessed   Lower Extremity Assessment Lower Extremity Assessment: Generalized weakness   Cervical / Trunk Assessment Cervical / Trunk Assessment: Kyphotic(forward head)   Communication Communication Communication: No difficulties   Cognition Arousal/Alertness: Awake/alert Behavior During Therapy: WFL for tasks assessed/performed Overall Cognitive Status: Within Functional Limits for tasks assessed                                 General Comments: able to state PLOF and current health status concerns. Does not need redirection              Home Living Family/patient expects to be discharged to:: Private residence Living Arrangements: Spouse/significant other Available Help at Discharge: Family;Available 24 hours/day Type of Home: House Home Access: Stairs to enter [employer] of Steps: 1 Entrance Stairs-Rails: None Home Layout: One level     Bathroom Shower/Tub: Producer, television/film/video: Standard     Home Equipment: Environmental consultant - 2 wheels;Cane - single point;Shower seat - built in  Prior Functioning/Environment Level of Independence: Independent with assistive device(s)        Comments: per patient, he used RW/SPC as he started to become weak; wife would perform household chores with patient leading a fairly sedentary lifestyle. Wife recent diagnosed with brain tumor (brainstem and behind left eye--going blind) with 2-3 months to live with patient tearful regarding this. Has 3 grown children who he reports can help at home        OT Problem List:  Decreased strength;Impaired balance (sitting and/or standing)      OT Treatment/Interventions: Self-care/ADL training;Balance training;Patient/family education    OT Goals(Current goals can be found in the care plan section) Acute Rehab OT Goals Patient Stated Goal: return home to wife OT Goal Formulation: With patient Time For Goal Achievement: 06/20/18 Potential to Achieve Goals: Good  OT Frequency: Min 2X/week              AM-PAC PT "6 Clicks" Daily Activity     Outcome Measure Help from another person eating meals?: None Help from another person taking care of personal grooming?: A Little Help from another person toileting, which includes using toliet, bedpan, or urinal?: A Little Help from another person bathing (including washing, rinsing, drying)?: A Little Help from another person to put on and taking off regular upper body clothing?: A Little Help from another person to put on and taking off regular lower body clothing?: A Little 6 Click Score: 19   End of Session Equipment Utilized During Treatment: Engineer, water Communication: (NT: condom cath came off, pt given urinal)  Activity Tolerance: Patient tolerated treatment well Patient left: in bed;with call bell/phone within reach;with bed alarm set  OT Visit Diagnosis: Unsteadiness on feet (R26.81)                Time: 4098-1191 OT Time Calculation (min): 26 min Charges:  OT General Charges $OT Visit: 1 Visit OT Evaluation $OT Eval Moderate Complexity: 1 Mod OT Treatments $Self Care/Home Management : 8-22 mins Ignacia Palma, OTR/L [phone] 06/06/2018

## 2018-06-07 DIAGNOSIS — R319 Hematuria, unspecified: Secondary | ICD-10-CM

## 2018-06-07 DIAGNOSIS — N390 Urinary tract infection, site not specified: Principal | ICD-10-CM

## 2018-06-07 DIAGNOSIS — E119 Type 2 diabetes mellitus without complications: Secondary | ICD-10-CM

## 2018-06-07 DIAGNOSIS — E860 Dehydration: Secondary | ICD-10-CM

## 2018-06-07 DIAGNOSIS — R627 Adult failure to thrive: Secondary | ICD-10-CM

## 2018-06-07 DIAGNOSIS — E872 Acidosis: Secondary | ICD-10-CM

## 2018-06-07 DIAGNOSIS — G9341 Metabolic encephalopathy: Secondary | ICD-10-CM

## 2018-06-07 LAB — GLUCOSE, CAPILLARY
GLUCOSE-CAPILLARY: 126 mg/dL — AB (ref 70–99)
GLUCOSE-CAPILLARY: 158 mg/dL — AB (ref 70–99)
Glucose-Capillary: 182 mg/dL — ABNORMAL HIGH (ref 70–99)
Glucose-Capillary: 150 mg/dL — ABNORMAL HIGH (ref 70–99)

## 2018-06-07 LAB — URINE CULTURE

## 2018-06-07 NOTE — Progress Notes (Signed)
Initial Nutrition Assessment  DOCUMENTATION CODES:   Not applicable  INTERVENTION:  Continue Ensure Enlive po BID, each supplement provides 350 kcal and 20 grams of protein  NUTRITION DIAGNOSIS:   Inadequate oral intake related to social / environmental circumstances(depression, wife's dx) as evidenced by per patient/family report.  GOAL:   Patient will meet greater than or equal to 90% of their needs  MONITOR:   PO intake, Weight trends, Supplement acceptance  REASON FOR ASSESSMENT:   Malnutrition Screening Tool    ASSESSMENT:   Patient with PMH HTN, DM2, Stroke, Depression with anxiety, Crohn's  Disease, who presents with AMS, generalized weakness, nausea, and vomiting. Patient was recently diagnosed with pneumonia 2 weeks ago, now is found to have an acute UTI with hematuria, and exhibits failure to thrive.   Spoke with patient at bedside. He did not provide a lot of information, this RD had to ask many questions. He reports an approximately 10 pound weight loss over the past 6 months and a UBW around 170 pounds. Per chart he exhibits a 12 pound/7% insignificant weight loss since February. PO intake usually consists of nothing for breakfast, a sandwich or fast food, or something he can find around the house for lunch, and a similar meal for dinner. He reports eating fast food around 3 or 4 times a week. He did not provide more detailed information on his intake. Had an ensure this morning and ate a few bites of breakfast, but complains of poor appetite that has been going on for several years with no apparent explanation. Noted his wife was recently diagnosed with glioblastoma but patient does not cite this as a reason for poor appetite and intake. Will monitor PO during this admission. Suspect malnutrition but unable to diagnose at this time.  Labs reviewed:  CBGs 126, 145, 156, 129  Medications reviewed and include:  Insulin NS at 169mL/hr   Intake/Output Summary (Last  24 hours) at 06/07/2018 1452 Last data filed at 06/07/2018 1026 Gross per 24 hour  Intake 2114.59 ml  Output 750 ml  Net 1364.59 ml  3.1L Fluid Positive  NUTRITION - FOCUSED PHYSICAL EXAM:    Most Recent Value  Orbital Region  Mild depletion  Upper Arm Region  No depletion  Thoracic and Lumbar Region  No depletion  Buccal Region  No depletion  Temple Region  Moderate depletion  Clavicle Bone Region  No depletion  Clavicle and Acromion Bone Region  No depletion  Scapular Bone Region  No depletion  Dorsal Hand  No depletion  Patellar Region  Mild depletion  Anterior Thigh Region  Mild depletion  Posterior Calf Region  No depletion  Edema (RD Assessment)  None  Hair  Reviewed  Eyes  Reviewed  Mouth  Reviewed  Skin  Reviewed  Nails  Reviewed       Diet Order:   Diet Order            Diet heart healthy/carb modified Room service appropriate? Yes; Fluid consistency: Thin  Diet effective now              EDUCATION NEEDS:   Not appropriate for education at this time  Skin:  Skin Assessment: Reviewed RN Assessment  Last BM:  06/03/2018  Height:   Ht Readings from Last 1 Encounters:  06/06/18 5\' 7"  (1.702 m)    Weight:   Wt Readings from Last 1 Encounters:  06/06/18 73.3 kg    Ideal Body Weight:  67.27 kg  BMI:  Body mass index is 25.31 kg/m.  Estimated Nutritional Needs:   Kcal:  1700-1900 calories  Protein:  73-81 grams (1.0-1.1g/kg)  Fluid:  1.7-1.9L    Dionne Ano. Gasper Hopes, MS, RD LDN Inpatient Clinical Dietitian Pager [phone]

## 2018-06-07 NOTE — Plan of Care (Signed)
Mr. Anglade has no complaints of pain today, but noted that he still burns with urination and feels that he is not emptying his bladder.  I recommend bladder scan PRN and a possible followup UA.    The patient's daughter spoke with Dr. Roda Shutters, CSW, PT and CM.   She is involved in her father's care and his safety after discharge.  She is particularly concerned that her father be able to get, schedule and take his medications on time.     Nursing POC:  Encourage PT/OT assistance, monitor urinary function, trend vital signs and support patient in performing ADLs as he recovers.

## 2018-06-07 NOTE — Progress Notes (Signed)
PROGRESS NOTE  Frank Phillips MKL:[id number] DOB: [birth year] DOA: 06/05/2018 PCP: Myrlene Broker, MD  HPI/Recap of past 24 hours:  He is feeling better, daughter at bed side  Assessment/Plan: Principal Problem:   UTI (urinary tract infection) Active Problems:   Diabetes mellitus without complication ([place])   Acute metabolic encephalopathy   GERD (gastroesophageal reflux disease)   Depression   Lung nodule   Dehydration   FTT (failure to thrive) in adult   Aortic atherosclerosis ([facility])  UTI with hematuria with acute metabolic encephalopathy -daughter noticed increased confusion in the last few days -ua on presentation with large hgb, moderate leuk, rare bacteria, he does has leukocytosis on presentation, wbc 11.9 -he is started on rocephin, urine culture in process -confusion seems has improved  H/o BPH: continue flomax, bladder scan  Lactic acidosis: Lactic acid peaked at 3.44  He received hydration, lactic acid normalized Home meds metformin held since admission   noninsulin dependent dm2 Home meds metformin held,  On ssi here Check a1c  Orthostatic hypotension On midodrine at home which was started from 03/2016 bp elevated on presentation, midodrine held Continue hydration, he appear dehydrated Will get orthostatic vital signs  Crohn's disease Appears stable.  Continue mesalamine  Aortic atherosclerosis seen on abdominal CT --No further evaluation planned  H/o CVA on plavix  Vascular dementia He was on aricept in the past per chart review, aricept is not on home meds list this time Currently he is aaox3, does has impaired short term memory  Small nodules at the right lung base measure up to 6 mm in size. --Non-contrast chest CT at 3-6 months is recommended. If the nodules are stable at time of repeat CT, then future CT at 18-24 months (from today's scan) is considered optional for low-risk patients, but is recommended for high-risk patients.  FTT: PT eval  recommended home health, daughter prefers SNF due to concerns about patient 's progressive weakness, confusion, patient's wife recently diagnosed with GBM  Code Status: DNR  Family Communication: patient , daughter at bedside  Disposition Plan: pending clinical improvement   Consultants:  none  Procedures:  none  Antibiotics:  rocephin   Objective: BP 128/72 (BP Location: Left Arm)   Pulse 81   Temp 98.6 F (37 C) (Oral)   Resp 16   Ht 5' 7"  (1.702 m)   Wt 73.3 kg   SpO2 99%   BMI 25.31 kg/m   Intake/Output Summary (Last 24 hours) at 06/07/2018 1031 Last data filed at 06/07/2018 0400 Gross per 24 hour  Intake 1030 ml  Output 2000 ml  Net -970 ml   Filed Weights   06/05/18 1831 06/06/18 0656  Weight: 70.8 kg 73.3 kg    Exam: Patient is examined daily including today on 06/07/2018, exams remain the same as of yesterday except that has changed    General:  NAD, aaox3, mild impaired short term memory  Cardiovascular: RRR, 2/6 murmur at right upper sternal border  Respiratory: CTABL  Abdomen: Soft/ND/NT, positive BS  Musculoskeletal: No Edema  Neuro: alert, oriented   Data Reviewed: Basic Metabolic Panel: Recent Labs  Lab 06/05/18 1826 06/06/18 0707  NA 136 142  K 4.0 3.8  CL 99 106  CO2 22 23  GLUCOSE 181* 98  BUN 14 8  CREATININE 0.82 0.72  CALCIUM 10.1 8.6*   Liver Function Tests: Recent Labs  Lab 06/05/18 1826 06/06/18 0707  AST 13* 10*  ALT 11 10  ALKPHOS 72 55  BILITOT  0.7 0.8  PROT 6.8 5.6*  ALBUMIN 3.7 3.0*   No results for input(s): LIPASE, AMYLASE in the last 168 hours. No results for input(s): AMMONIA in the last 168 hours. CBC: Recent Labs  Lab 06/05/18 1826  WBC 11.9*  NEUTROABS 9.0*  HGB 11.6*  HCT 37.4*  MCV 83.7  PLT 246   Cardiac Enzymes:   No results for input(s): CKTOTAL, CKMB, CKMBINDEX, TROPONINI in the last 168 hours. BNP (last 3 results) No results for input(s): BNP in the last 8760 hours.  ProBNP  (last 3 results) No results for input(s): PROBNP in the last 8760 hours.  CBG: Recent Labs  Lab 06/06/18 0844 06/06/18 1146 06/06/18 1623 06/06/18 2129 06/07/18 0633  GLUCAP 96 129* 156* 145* 126*    No results found for this or any previous visit (from the past 240 hour(s)).   Studies: No results found.  Scheduled Meds: . buPROPion  150 mg Oral BID  . clopidogrel  75 mg Oral Q breakfast  . enoxaparin (LOVENOX) injection  40 mg Subcutaneous Q24H  . feeding supplement (ENSURE ENLIVE)  237 mL Oral BID BM  . insulin aspart  0-5 Units Subcutaneous QHS  . insulin aspart  0-9 Units Subcutaneous TID WC  . mesalamine  1,500 mg Oral Daily  . pantoprazole  40 mg Oral Daily  . tamsulosin  0.4 mg Oral Daily    Continuous Infusions: . sodium chloride 100 mL/hr at 06/07/18 0625  . cefTRIAXone (ROCEPHIN)  IV Stopped (06/06/18 2300)     Time spent: 60mns,more than 50% time spent on coordination of care I have personally reviewed and interpreted on  06/07/2018 daily labs,  imagings as discussed above under date review session and assessment and plans.  I reviewed all nursing notes, pharmacy notes,  vitals, pertinent old records  I have discussed plan of care as described above with RN , patient and family on 06/07/2018   FFlorencia ReasonsMD, PhD  [facility] Pager [phone] If 7PM-7AM, please contact night-coverage at [email or web address], password [email or web address]8/05/2018, 10:31 AM  LOS: 1 day

## 2018-06-07 NOTE — Progress Notes (Signed)
CSW met with patient's daughter at bedside this morning to discuss her concerns. Patient's daughter requested that patient's HCPOA be updated, as it wasn't filled out correctly the first time. CSW provided advanced directives packet. Patient's daughter requested information about home health services and other information for caregiving at home. CSW alerted RNCM.   No further CSW needs at this time. CSW signing off.  Laveda Abbe, [place] Clinical Social Worker [phone]

## 2018-06-07 NOTE — Progress Notes (Signed)
Physical Therapy Treatment Patient Details Name: Frank Phillips MRN: [id number] DOB: 1946-07-22 Today's Date: 06/07/2018    History of Present Illness Patient is a 72 y/o male admitted with AMS, generalized weakness, Acute UTI with hematuria on 06/05/18. Negative chest x-ray, negative CT head for acute intracranial abnormalities. Patient with a PMH significant for hypertension, diabetes mellitus, stroke, GERD, depression with anxiety, Crohn's disease, BPH.    PT Comments    Mr. Deloatch doing well today. Reports primary concerns of burning with urination - nursing notified. Improved functional status with patient at general min guard for safety - without LOB or overt instability with mobility. Making good progress towards goals.     Follow Up Recommendations  Home health PT;Supervision - Intermittent;Supervision for mobility/OOB     Equipment Recommendations  None recommended by PT    Recommendations for Other Services       Precautions / Restrictions Precautions Precautions: Fall Restrictions Weight Bearing Restrictions: No    Mobility  Bed Mobility Overal bed mobility: Modified Independent Bed Mobility: Sit to Supine       Sit to supine: Modified independent (Device/Increase time)   General bed mobility comments: increased time  Transfers Overall transfer level: Needs assistance Equipment used: Rolling walker (2 wheeled) Transfers: Sit to/from Stand Sit to Stand: Min guard         General transfer comment: from recliner - no LOB with initial standing posture  Ambulation/Gait Ambulation/Gait assistance: Min guard Gait Distance (Feet): 200 Feet Assistive device: Rolling walker (2 wheeled) Gait Pattern/deviations: Step-through pattern;Decreased stride length;Trunk flexed Gait velocity: decreased   General Gait Details: downward gaze - verbal cues for forward gaze with patient stating his neck feels weak. No LOB or overt instability   Stairs              Wheelchair Mobility    Modified Rankin (Stroke Patients Only)       Balance Overall balance assessment: Mild deficits observed, not formally tested                                          Cognition Arousal/Alertness: Awake/alert Behavior During Therapy: WFL for tasks assessed/performed Overall Cognitive Status: Within Functional Limits for tasks assessed                                        Exercises      General Comments        Pertinent Vitals/Pain Pain Assessment: No/denies pain    Home Living                      Prior Function            PT Goals (current goals can now be found in the care plan section) Acute Rehab PT Goals Patient Stated Goal: return home to wife PT Goal Formulation: With patient Time For Goal Achievement: 06/20/18 Potential to Achieve Goals: Good Progress towards PT goals: Progressing toward goals    Frequency    Min 3X/week      PT Plan Current plan remains appropriate    Co-evaluation              AM-PAC PT "6 Clicks" Daily Activity  Outcome Measure  Difficulty turning over in bed (including adjusting bedclothes, sheets and  blankets)?: A Little Difficulty moving from lying on back to sitting on the side of the bed? : A Little Difficulty sitting down on and standing up from a chair with arms (e.g., wheelchair, bedside commode, etc,.)?: Unable Help needed moving to and from a bed to chair (including a wheelchair)?: A Little Help needed walking in hospital room?: A Little Help needed climbing 3-5 steps with a railing? : A Little 6 Click Score: 16    End of Session Equipment Utilized During Treatment: Gait belt Activity Tolerance: Patient tolerated treatment well Patient left: in bed;with call bell/phone within reach;with family/visitor present Nurse Communication: Mobility status PT Visit Diagnosis: Unsteadiness on feet (R26.81);Other abnormalities of gait and  mobility (R26.89);Muscle weakness (generalized) (M62.81)     Time: 6387-5643 PT Time Calculation (min) (ACUTE ONLY): 22 min  Charges:  $Gait Training: 8-22 mins                     Lanney Gins, PT, DPT 06/07/18 2:20 PM Pager: [phone]

## 2018-06-08 LAB — BASIC METABOLIC PANEL
Anion gap: 15 (ref 5–15)
BUN: 9 mg/dL (ref 8–23)
CHLORIDE: 104 mmol/L (ref 98–111)
CO2: 22 mmol/L (ref 22–32)
Calcium: 9 mg/dL (ref 8.9–10.3)
Creatinine, Ser: 0.75 mg/dL (ref 0.61–1.24)
GFR calc Af Amer: >60 mL/min (ref >60)
GFR calc non Af Amer: >60 mL/min (ref >60)
Glucose, Bld: 120 mg/dL — ABNORMAL HIGH (ref 70–99)
POTASSIUM: 3.4 mmol/L — AB (ref 3.5–5.1)
SODIUM: 141 mmol/L (ref 135–145)

## 2018-06-08 LAB — GLUCOSE, CAPILLARY
GLUCOSE-CAPILLARY: 122 mg/dL — AB (ref 70–99)
GLUCOSE-CAPILLARY: 204 mg/dL — AB (ref 70–99)
Glucose-Capillary: 192 mg/dL — ABNORMAL HIGH (ref 70–99)
Glucose-Capillary: 171 mg/dL — ABNORMAL HIGH (ref 70–99)

## 2018-06-08 LAB — CBC WITH DIFFERENTIAL/PLATELET
ABS IMMATURE GRANULOCYTES: 0.1 10*3/uL (ref 0.0–0.1)
BASOS ABS: 0 10*3/uL (ref 0.0–0.1)
Basophils Relative: 1 %
Eosinophils Absolute: 0.4 10*3/uL (ref 0.0–0.7)
Eosinophils Relative: 5 %
HCT: 33.3 % — ABNORMAL LOW (ref 39.0–52.0)
HEMOGLOBIN: 10.4 g/dL — AB (ref 13.0–17.0)
IMMATURE GRANULOCYTES: 1 %
LYMPHS PCT: 19 %
Lymphs Abs: 1.4 10*3/uL (ref 0.7–4.0)
MCH: 25.6 pg — ABNORMAL LOW (ref 26.0–34.0)
MCHC: 31.2 g/dL (ref 30.0–36.0)
MCV: 82 fL (ref 78.0–100.0)
Monocytes Absolute: 0.7 10*3/uL (ref 0.1–1.0)
Monocytes Relative: 9 %
NEUTROS ABS: 5 10*3/uL (ref 1.7–7.7)
NEUTROS PCT: 65 %
Platelets: 193 10*3/uL (ref 150–400)
RBC: 4.06 MIL/uL — AB (ref 4.22–5.81)
RDW: 14.9 % (ref 11.5–15.5)
WBC: 7.5 10*3/uL (ref 4.0–10.5)

## 2018-06-08 LAB — HEMOGLOBIN A1C
Hgb A1c MFr Bld: 5.2 % (ref 4.8–5.6)
MEAN PLASMA GLUCOSE: 102.54 mg/dL

## 2018-06-08 LAB — MAGNESIUM: MAGNESIUM: 1.4 mg/dL — AB (ref 1.7–2.4)

## 2018-06-08 MED ORDER — MAGNESIUM SULFATE 2 GM/50ML IV SOLN
2.0000 g | Freq: Once | INTRAVENOUS | Status: AC
  Administered 2018-06-08: 2 g via INTRAVENOUS
  Filled 2018-06-08: qty 50

## 2018-06-08 MED ORDER — SODIUM CHLORIDE 0.9 % IV SOLN
INTRAVENOUS | Status: AC
  Administered 2018-06-08: 12:00:00 via INTRAVENOUS

## 2018-06-08 MED ORDER — POTASSIUM CHLORIDE CRYS ER 20 MEQ PO TBCR
40.0000 meq | EXTENDED_RELEASE_TABLET | Freq: Once | ORAL | Status: AC
Start: 2018-06-08 — End: 2018-06-08
  Administered 2018-06-08: 40 meq via ORAL
  Filled 2018-06-08: qty 2

## 2018-06-08 MED ORDER — CEPHALEXIN 500 MG PO CAPS
500.0000 mg | ORAL_CAPSULE | Freq: Two times a day (BID) | ORAL | Status: DC
  Administered 2018-06-08 – 2018-06-09 (×3): 500 mg via ORAL
  Filled 2018-06-08 (×3): qty 1

## 2018-06-08 NOTE — Care Management Note (Signed)
Case Management Note  Patient Details  Name: Frank Phillips MRN: [id number] Date of Birth: 18-Feb-1946  Subjective/Objective:   Pt admitted with UTI. He is from home with his wife who is newly diagnosed brain cancer and is home with hospice. Daughter inquired about medication assistance in the home for the patient.                  Action/Plan: MD placed orders for [facility] services. CM provided the daughter choice and she selected [facility]. Cory with [facility] notified and accepted the referral.  [facility] has a medication program that they will discuss with the daughter to see if this will assist them with needs in the home.  Daughter is arranging caregivers in the home and will provide transportation home for the patient.   Expected Discharge Date:                  Expected Discharge Plan:  Home w Home Health Services  In-House Referral:     Discharge planning Services  CM Consult, Other - See comment  Post Acute Care Choice:    Choice offered to:  Adult Children  DME Arranged:    DME Agency:     HH Arranged:  RN, PT, OT, Nurse's Aide, Social Work [employer] Agency:  [facility]  Status of Service:  Completed, signed off  If discussed at [employer], dates discussed:    Additional Comments:  Kermit Balo, RN 06/08/2018, 3:59 PM

## 2018-06-08 NOTE — Progress Notes (Signed)
PROGRESS NOTE  Frank Phillips EYC:[id number] DOB: 03-13-1946 DOA: 06/05/2018 PCP: Myrlene Broker, MD  HPI/Recap of past 24 hours:  He is feeling better, sitting up in chair eating lunch  Assessment/Plan: Principal Problem:   UTI (urinary tract infection) Active Problems:   Diabetes mellitus without complication ([place])   Acute metabolic encephalopathy   GERD (gastroesophageal reflux disease)   Depression   Lung nodule   Dehydration   FTT (failure to thrive) in adult   Aortic atherosclerosis ([facility])  UTI with hematuria with acute metabolic encephalopathy -daughter noticed increased confusion in the last few days -ua on presentation with large hgb, moderate leuk, rare bacteria, he does has leukocytosis on presentation, wbc 11.9 -urine culture with multiple species, blood culture no growth -confusion seems has improved - he is started on rocephin since admission, change to keflex  H/o BPH: continue flomax, bladder scan  Lactic acidosis: Lactic acid peaked at 3.44  He received hydration, lactic acid normalized Home meds metformin held since admission   noninsulin dependent dm2 Home meds metformin held,  On ssi here Check a1c  Orthostatic hypotension On midodrine at home which was started from 03/2016 bp elevated on presentation, midodrine held Continue hydration, he appear dehydrated orthostatic vital signs ordered, not done yet  Crohn's disease Appears stable.  Continue mesalamine  Aortic atherosclerosis seen on abdominal CT --No further evaluation planned  H/o CVA on plavix  Vascular dementia He was on aricept in the past per chart review, aricept is not on home meds list this time Currently he is aaox3, does has impaired short term memory  Small nodules at the right lung base measure up to 6 mm in size. --Non-contrast chest CT at 3-6 months is recommended. If the nodules are stable at time of repeat CT, then future CT at 18-24 months (from today's scan) is  considered optional for low-risk patients, but is recommended for high-risk patients.  FTT: PT eval recommended home health, daughter prefers SNF due to concerns about patient 's progressive weakness, confusion, patient's wife recently diagnosed with GBM  Code Status: DNR  Family Communication: patient , daughter at bedside  Disposition Plan: home with home health, likely tomorrow   Consultants:  none  Procedures:  none  Antibiotics:  Rocephin from admission to 8/9  Keflex from 8/9 to    Objective: BP (!) 147/74 (BP Location: Left Arm)   Pulse 66   Temp 98.4 F (36.9 C) (Oral)   Resp 15   Ht 5' 7"  (1.702 m)   Wt 73.3 kg   SpO2 95%   BMI 25.31 kg/m   Intake/Output Summary (Last 24 hours) at 06/08/2018 1215 Last data filed at 06/08/2018 0849 Gross per 24 hour  Intake 1966.05 ml  Output 475 ml  Net 1491.05 ml   Filed Weights   06/05/18 1831 06/06/18 0656  Weight: 70.8 kg 73.3 kg    Exam: Patient is examined daily including today on 06/08/2018, exams remain the same as of yesterday except that has changed    General:  NAD, aaox3, mild impaired short term memory  Cardiovascular: RRR, 2/6 murmur at right upper sternal border  Respiratory: CTABL  Abdomen: Soft/ND/NT, positive BS  Musculoskeletal: No Edema  Neuro: alert, oriented   Data Reviewed: Basic Metabolic Panel: Recent Labs  Lab 06/05/18 1826 06/06/18 0707 06/08/18 0441  NA 136 142 141  K 4.0 3.8 3.4*  CL 99 106 104  CO2 22 23 22   GLUCOSE 181* 98 120*  BUN 14  8 9  CREATININE 0.82 0.72 0.75  CALCIUM 10.1 8.6* 9.0  MG  --   --  1.4*   Liver Function Tests: Recent Labs  Lab 06/05/18 1826 06/06/18 0707  AST 13* 10*  ALT 11 10  ALKPHOS 72 55  BILITOT 0.7 0.8  PROT 6.8 5.6*  ALBUMIN 3.7 3.0*   No results for input(s): LIPASE, AMYLASE in the last 168 hours. No results for input(s): AMMONIA in the last 168 hours. CBC: Recent Labs  Lab 06/05/18 1826 06/08/18 0441  WBC 11.9* 7.5    NEUTROABS 9.0* 5.0  HGB 11.6* 10.4*  HCT 37.4* 33.3*  MCV 83.7 82.0  PLT 246 193   Cardiac Enzymes:   No results for input(s): CKTOTAL, CKMB, CKMBINDEX, TROPONINI in the last 168 hours. BNP (last 3 results) No results for input(s): BNP in the last 8760 hours.  ProBNP (last 3 results) No results for input(s): PROBNP in the last 8760 hours.  CBG: Recent Labs  Lab 06/07/18 1138 06/07/18 1706 06/07/18 2122 06/08/18 0642 06/08/18 1155  GLUCAP 182* 158* 150* 122* 192*    Recent Results (from the past 240 hour(s))  Urine culture     Status: Abnormal   Collection Time: 06/05/18  8:50 PM  Result Value Ref Range Status   Specimen Description URINE, RANDOM  Final   Special Requests   Final    NONE Performed at [facility], [place] [address], [place], [place] [address]    Culture MULTIPLE SPECIES PRESENT, SUGGEST RECOLLECTION (A)  Final   Report Status 06/07/2018 FINAL  Final  Blood Culture (routine x 2)     Status: None (Preliminary result)   Collection Time: 06/05/18 11:10 PM  Result Value Ref Range Status   Specimen Description BLOOD RIGHT ARM  Final   Special Requests   Final    BOTTLES DRAWN AEROBIC AND ANAEROBIC Blood Culture adequate volume   Culture   Final    NO GROWTH 1 DAY Performed at [facility], [place] [address], [place], [place] [address]    Report Status PENDING  Incomplete  Blood Culture (routine x 2)     Status: None (Preliminary result)   Collection Time: 06/05/18 11:15 PM  Result Value Ref Range Status   Specimen Description BLOOD BLOOD LEFT FOREARM  Final   Special Requests   Final    BOTTLES DRAWN AEROBIC AND ANAEROBIC Blood Culture adequate volume   Culture   Final    NO GROWTH 1 DAY Performed at [facility], [place] [address] [place],  [address]    Report Status PENDING  Incomplete     Studies: No results found.  Scheduled Meds: . buPROPion  150 mg Oral BID  . clopidogrel  75 mg Oral Q breakfast  . enoxaparin  (LOVENOX) injection  40 mg Subcutaneous Q24H  . feeding supplement (ENSURE ENLIVE)  237 mL Oral BID BM  . insulin aspart  0-5 Units Subcutaneous QHS  . insulin aspart  0-9 Units Subcutaneous TID WC  . mesalamine  1,500 mg Oral Daily  . pantoprazole  40 mg Oral Daily  . tamsulosin  0.4 mg Oral Daily    Continuous Infusions: . sodium chloride    . cefTRIAXone (ROCEPHIN)  IV Stopped (06/08/18 0027)     Time spent: 25 I have personally reviewed and interpreted on  06/08/2018 daily labs,  imagings as discussed above under date review session and assessment and plans.  I reviewed all nursing notes,  pharmacy notes,  vitals, pertinent old records  I have discussed plan of care as described above with RN , patient  on 06/08/2018   Florencia Reasons MD, PhD  [facility] Pager [phone]. If 7PM-7AM, please contact night-coverage at [email or web address], password [facility] 06/08/2018, 12:15 PM  LOS: 2 days

## 2018-06-08 NOTE — Progress Notes (Signed)
Pt stated just went to bathroom 5 minutes ago. Performed bladder scan on patient which showed 18mL of fluid in bladder.

## 2018-06-08 NOTE — Consult Note (Signed)
   [facility] CM Inpatient Consult   06/08/2018  Frank Phillips 13-Jun-1946 [id number]   Patient was assess for hospitalization in the [employer] plan and had been referred in the past.     Met with the patient regarding the benefits of [facility] Care Management services. Explained that [facility] is a covered benefit of insurance. Review information for [facility] Care Management and a folder was provided with contact information.  Explained that [place] Management does not interfere with or replace any services arranged by the inpatient care management staff.  Patient politely declined services with [facility] Management at this time.  He states he still drives and not to his knowledge that he has any medication concerns for post hospital.  He states his, "wife may need services like this but not sure she has the insurance.  I will check."  Patient accepted a brochure, 24 hour nurse adviseline magnet and contact information.  For questions, please contact:  Natividad Brood, RN BSN [facility] Liaison  [phone] business mobile phone Toll free office [phone]

## 2018-06-08 NOTE — Progress Notes (Signed)
Physical Therapy Treatment Patient Details Name: Frank Phillips MRN: [id number] DOB: 1946/06/07 Today's Date: 06/08/2018    History of Present Illness Patient is a 72 y/o male admitted with AMS, generalized weakness, Acute UTI with hematuria on 06/05/18. Negative chest x-ray, negative CT head for acute intracranial abnormalities. Patient with a PMH significant for hypertension, diabetes mellitus, stroke, GERD, depression with anxiety, Crohn's disease, BPH.    PT Comments    Patient doing well this afternoon. RN requesting orthostatic vitals from PT. (see below) Today transferring from recliner and performing hallway ambulation with RW with general supervision level. No LOB with gait but does require verbal cueing for safety with mobility as well as for posturing. Making good progress towards goals.   BP sitting: 159/75 BP immediate standing: 129/76 BP 2 min standing: 144/77    Follow Up Recommendations  Home health PT;Supervision - Intermittent;Supervision for mobility/OOB     Equipment Recommendations  None recommended by PT    Recommendations for Other Services       Precautions / Restrictions Precautions Precautions: Fall Restrictions Weight Bearing Restrictions: No    Mobility  Bed Mobility               General bed mobility comments: up in recliner  Transfers Overall transfer level: Needs assistance Equipment used: Rolling walker (2 wheeled) Transfers: Sit to/from Stand Sit to Stand: Supervision         General transfer comment: from recliner - without LOB  Ambulation/Gait Ambulation/Gait assistance: Supervision Gait Distance (Feet): 180 Feet Assistive device: Rolling walker (2 wheeled) Gait Pattern/deviations: Step-through pattern;Decreased stride length;Trunk flexed Gait velocity: decreased   General Gait Details: continued downward gaze; supervision for safety, no LOB   Stairs             Wheelchair Mobility    Modified Rankin (Stroke  Patients Only)       Balance Overall balance assessment: Mild deficits observed, not formally tested                                          Cognition Arousal/Alertness: Awake/alert Behavior During Therapy: WFL for tasks assessed/performed Overall Cognitive Status: Within Functional Limits for tasks assessed                                        Exercises      General Comments        Pertinent Vitals/Pain Pain Assessment: No/denies pain    Home Living                      Prior Function            PT Goals (current goals can now be found in the care plan section) Acute Rehab PT Goals Patient Stated Goal: return home to wife PT Goal Formulation: With patient Time For Goal Achievement: 06/20/18 Potential to Achieve Goals: Good Progress towards PT goals: Progressing toward goals    Frequency    Min 3X/week      PT Plan Current plan remains appropriate    Co-evaluation              AM-PAC PT "6 Clicks" Daily Activity  Outcome Measure  Difficulty turning over in bed (including adjusting bedclothes, sheets and blankets)?: A Little Difficulty  moving from lying on back to sitting on the side of the bed? : A Little Difficulty sitting down on and standing up from a chair with arms (e.g., wheelchair, bedside commode, etc,.)?: A Little Help needed moving to and from a bed to chair (including a wheelchair)?: A Little Help needed walking in hospital room?: A Little Help needed climbing 3-5 steps with a railing? : A Little 6 Click Score: 18    End of Session Equipment Utilized During Treatment: Gait belt Activity Tolerance: Patient tolerated treatment well Patient left: in chair;with call bell/phone within reach;with chair alarm set Nurse Communication: Mobility status PT Visit Diagnosis: Unsteadiness on feet (R26.81);Other abnormalities of gait and mobility (R26.89);Muscle weakness (generalized) (M62.81)      Time: 4098-1191 PT Time Calculation (min) (ACUTE ONLY): 21 min  Charges:  $Gait Training: 8-22 mins                     Kipp Laurence, PT, DPT 06/08/18 1:27 PM Pager: [phone]

## 2018-06-08 NOTE — Progress Notes (Signed)
Occupational Therapy Treatment Patient Details Name: Frank Phillips MRN: [id number] DOB: 1946-03-24 Today's Date: 06/08/2018    History of present illness Patient is a 72 y/o male admitted with AMS, generalized weakness, Acute UTI with hematuria on 06/05/18. Negative chest x-ray, negative CT head for acute intracranial abnormalities. Patient with a PMH significant for hypertension, diabetes mellitus, stroke, GERD, depression with anxiety, Crohn's disease, BPH.   OT comments  Pt making progress towards OT goals this session. Able to complete transfers min guard, min A for standing grooming at sink (washing hands) followed by set up for eating (Pt limiting use of R hand due to IV insertion). DC recommendations changed to [facility] due to family concerns of higher executive thinking skills (medication management) - and home management. Next session plan to perform cognitive assessment with focus on executive functioning.    Follow Up Recommendations  Home health OT;Supervision/Assistance - 24 hour    Equipment Recommendations  None recommended by OT    Recommendations for Other Services      Precautions / Restrictions Precautions Precautions: Fall Restrictions Weight Bearing Restrictions: No       Mobility Bed Mobility Overal bed mobility: Needs Assistance Bed Mobility: Sit to Supine     Supine to sit: Min guard     General bed mobility comments: no physical assist needed  Transfers Overall transfer level: Needs assistance Equipment used: Rolling walker (2 wheeled) Transfers: Sit to/from Stand Sit to Stand: Min guard         General transfer comment: from EOB, no LOB noticed    Balance Overall balance assessment: Mild deficits observed, not formally tested                                         ADL either performed or assessed with clinical judgement   ADL Overall ADL's : Needs assistance/impaired Eating/Feeding: Sitting;Set up Eating/Feeding Details  (indicate cue type and reason): required assist due to pain in his RUE bc of the IV                     Toilet Transfer: Min Barrister's clerk Details (indicate cue type and reason): no physical assist needed min guard for safety         Functional mobility during ADLs: Min guard;Rolling walker General ADL Comments: Per conversation with PT daughter is concerned about medicine management. Educated Pt on strategies for memory and medicine management and Pt denies need for help at this time     Vision       Perception     Praxis      Cognition Arousal/Alertness: Awake/alert Behavior During Therapy: WFL for tasks assessed/performed Overall Cognitive Status: Within Functional Limits for tasks assessed                                          Exercises     Shoulder Instructions       General Comments      Pertinent Vitals/ Pain       Pain Assessment: No/denies pain  Home Living  Prior Functioning/Environment              Frequency  Min 2X/week        Progress Toward Goals  OT Goals(current goals can now be found in the care plan section)  Progress towards OT goals: Progressing toward goals  Acute Rehab OT Goals Patient Stated Goal: return home to wife OT Goal Formulation: With patient Time For Goal Achievement: 06/20/18 Potential to Achieve Goals: Good  Plan Discharge plan needs to be updated;Frequency remains appropriate    Co-evaluation                 AM-PAC PT "6 Clicks" Daily Activity     Outcome Measure   Help from another person eating meals?: None Help from another person taking care of personal grooming?: A Little Help from another person toileting, which includes using toliet, bedpan, or urinal?: A Little Help from another person bathing (including washing, rinsing, drying)?: A Little Help from another person to put on and  taking off regular upper body clothing?: A Little Help from another person to put on and taking off regular lower body clothing?: A Little 6 Click Score: 19    End of Session Equipment Utilized During Treatment: Rolling walker  OT Visit Diagnosis: Unsteadiness on feet (R26.81)   Activity Tolerance Patient tolerated treatment well   Patient Left in chair;with call bell/phone within reach;with chair alarm set;with nursing/sitter in room   Nurse Communication          Time: [phone] OT Time Calculation (min): 17 min  Charges: OT General Charges $OT Visit: 1 Visit OT Treatments $Self Care/Home Management : 8-22 mins  Sherryl Manges OTR/L [phone]   Frank Phillips 06/08/2018, 4:00 PM

## 2018-06-09 LAB — CBC
HCT: 33.6 % — ABNORMAL LOW (ref 39.0–52.0)
HEMOGLOBIN: 10.5 g/dL — AB (ref 13.0–17.0)
MCH: 25.7 pg — AB (ref 26.0–34.0)
MCHC: 31.3 g/dL (ref 30.0–36.0)
MCV: 82.2 fL (ref 78.0–100.0)
PLATELETS: 185 10*3/uL (ref 150–400)
RBC: 4.09 MIL/uL — AB (ref 4.22–5.81)
RDW: 15.2 % (ref 11.5–15.5)
WBC: 8.5 10*3/uL (ref 4.0–10.5)

## 2018-06-09 LAB — BASIC METABOLIC PANEL
Anion gap: 9 (ref 5–15)
BUN: 9 mg/dL (ref 8–23)
CHLORIDE: 105 mmol/L (ref 98–111)
CO2: 26 mmol/L (ref 22–32)
Calcium: 9 mg/dL (ref 8.9–10.3)
Creatinine, Ser: 0.79 mg/dL (ref 0.61–1.24)
GFR calc Af Amer: >60 mL/min (ref >60)
GFR calc non Af Amer: >60 mL/min (ref >60)
GLUCOSE: 146 mg/dL — AB (ref 70–99)
POTASSIUM: 3.7 mmol/L (ref 3.5–5.1)
Sodium: 140 mmol/L (ref 135–145)

## 2018-06-09 LAB — GLUCOSE, CAPILLARY
Glucose-Capillary: 145 mg/dL — ABNORMAL HIGH (ref 70–99)
Glucose-Capillary: 220 mg/dL — ABNORMAL HIGH (ref 70–99)

## 2018-06-09 LAB — MAGNESIUM: Magnesium: 1.7 mg/dL (ref 1.7–2.4)

## 2018-06-09 MED ORDER — CEPHALEXIN 500 MG PO CAPS: 500.0000 mg | ORAL_CAPSULE | Freq: Two times a day (BID) | ORAL | 0 refills | Status: DC

## 2018-06-09 MED ORDER — POTASSIUM CHLORIDE CRYS ER 20 MEQ PO TBCR
40.0000 meq | EXTENDED_RELEASE_TABLET | Freq: Once | ORAL | Status: AC
  Administered 2018-06-09: 40 meq via ORAL
  Filled 2018-06-09: qty 2

## 2018-06-09 MED ORDER — CEPHALEXIN 500 MG PO CAPS: 500.0000 mg | ORAL_CAPSULE | Freq: Two times a day (BID) | ORAL | 0 refills | Status: AC

## 2018-06-09 MED ORDER — MAGNESIUM SULFATE IN D5W 1-5 GM/100ML-% IV SOLN
1.0000 g | Freq: Once | INTRAVENOUS | Status: AC
  Administered 2018-06-09: 1 g via INTRAVENOUS
  Filled 2018-06-09: qty 100

## 2018-06-09 MED ORDER — MAGNESIUM OXIDE 400 MG PO CAPS: 400.0000 mg | ORAL_CAPSULE | Freq: Every day | ORAL | 0 refills | Status: AC

## 2018-06-09 MED ORDER — MAGNESIUM OXIDE 400 MG PO CAPS: 400.0000 mg | ORAL_CAPSULE | Freq: Every day | ORAL | 0 refills | Status: DC

## 2018-06-09 NOTE — Progress Notes (Signed)
Patient and daughter given discharge summary and teaching. Able to verbalize understanding and teach back. No new questions or concerns. IV removed, discharged from unit in wheelchair by staff. Daughter will transport home.

## 2018-06-09 NOTE — Discharge Summary (Signed)
Discharge Summary  Frank Phillips HGD:[id number] DOB: 1946/03/15  PCP: Myrlene Broker, MD  Admit date: 06/05/2018 Discharge date: 06/09/2018  Time spent: 26mns, more than 50% time spent on coordination of care  Recommendations for Outpatient Follow-up:  1. F/u with PMD within a week  for hospital discharge follow up, repeat cbc/bmp at follow up 2. Home health arranged  Discharge Diagnoses:  Active Hospital Problems   Diagnosis Date Noted  . UTI (urinary tract infection) 06/06/2018  . Diabetes mellitus without complication ([facility] 096/22/2979 . Acute metabolic encephalopathy 089/21/1941 . GERD (gastroesophageal reflux disease) 06/06/2018  . Depression 06/06/2018  . Lung nodule 06/06/2018  . Dehydration 06/06/2018  . FTT (failure to thrive) in adult 06/06/2018  . Aortic atherosclerosis ([place] 06/06/2018    Resolved Hospital Problems  No resolved problems to display.    Discharge Condition: stable  Diet recommendation: heart healthy/carb modified  Filed Weights   06/05/18 1831 06/06/18 0656  Weight: 70.8 kg 73.3 kg    History of present illness: (per admitting MD Dr GSarajane Jews PCP: RMyrlene Broker MD   Chief Complaint: pain with urination  HPI:  72year old man PMH diabetes mellitus, hypertension, hyperlipidemia, stroke without residual symptoms, dementia, presented to the emergency department with generalized weakness, decreased appetite, poor oral intake, dysuria, increasing confusion.  Admitted for treatment of UTI.  History obtained from patient as well as daughter JAnderson Maltaat bedside.  [facility] [place] [facility], was quite weak afterwards but did not improve to the point where he was walking with a cane.  [place] by the end of the trip he was much stronger.  Over the last several days to a week he has had decreased appetite, poor oral intake, generalized weakness and reportedly increased confusion  without specific aggravating or alleviating factors.  His wife was diagnosed with glioblastoma within the last 2 weeks, complicating home life as the patient requires assistance with most ADLs and his wife is his primary caretaker.  Daughter has noticed flat affect and increased confusion over the last several days.  She wonders whether he may need to go to assisted living.  Wife had been taking care of patient's medications, however daughter found these medications jumbled and the patient did not complete his antibiotic course from pneumonia last month.  Seen in office by PCP 8/6, diagnosed with orthostatic hypotension and referred to the emergency department, patient did not go at that time.    Patient reports dysuria for several days, a few episodes of vomiting, no other specific symptoms reported.  ED Course: Afebrile, vital signs stable, treated with Benadryl, GI cocktail, Compazine, ceftriaxone, 3 L normal saline  Hospital Course:  Principal Problem:   UTI (urinary tract infection) Active Problems:   Diabetes mellitus without complication ([place]   Acute metabolic encephalopathy   GERD (gastroesophageal reflux disease)   Depression   Lung nodule   Dehydration   FTT (failure to thrive) in adult   Aortic atherosclerosis (HCC)   UTI with hematuria with acute metabolic encephalopathy -daughter noticed increased confusion in the last few days, patient c/o dysuria on presentation -ua on presentation with large hgb, moderate leuk, rare bacteria, he does has leukocytosis on presentation, wbc 11.9 -urine culture with multiple species, blood culture no growth -confusion  has improved - he is started on rocephin since admission, change to keflex to finish treatment course  H/o BPH: continue flomax, post void residual 58cc.   Lactic acidosis:  Lactic acid peaked at 3.44  He received hydration, lactic acid normalized Home meds metformin held since admission   noninsulin dependent  dm2 Home meds glipizide, metformin held in the hospital, resumed at discharge a1c 5.2  Orthostatic hypotension On midodrine at home which was started from 03/2016 bp elevated on presentation, midodrine held Continue hydration, he appear dehydrated orthostatic vital signs obtained in the hospital unremarkable, he denies dizziness when standing up, Midodrine discontinued   Crohn's disease Appears stable. Continue mesalamine  Aortic atherosclerosisseen on abdominal CT --No further evaluation planned  H/o CVA on plavix  Vascular dementia He was on aricept in the past per chart review, aricept is not on home meds list this time Currently he is aaox3, does has impaired short term memory F/u with neurology  Small nodules at the right lung basemeasure up to 6 mm in size. --Non-contrast chest CT at 3-6 months is recommended. If the nodules are stable at time of repeat CT, then future CT at 18-24 months (from today's scan) is considered optional for low-risk patients, but is recommended for high-risk patients.  FTT: PT eval recommended home health, daughter prefers SNF due to concerns about patient 's progressive weakness, confusion, memory loss and patient's wife recently diagnosed with GBM [facility], pcp to assist transition to alf  Code Status: DNR  Family Communication: patient , daughter at bedside  Disposition Plan: home with home health   Consultants:  none  Procedures:  none  Antibiotics:  Rocephin from admission to 8/9  Keflex from 8/9 to   Discharge Exam: BP (!) 147/64 (BP Location: Right Arm)   Pulse 71   Temp 98.1 F (36.7 C) (Oral)   Resp 18   Ht 5' 7"  (1.702 m)   Wt 73.3 kg   SpO2 97%   BMI 25.31 kg/m   General: NAD, aaox3, impaired short term memory Cardiovascular: RRR, 2/6 murmur at right upper sternal border Respiratory: CTABL  Discharge Instructions You were cared for by a hospitalist during your hospital stay.  If you have any questions about your discharge medications or the care you received while you were in the hospital after you are discharged, you can call the unit and asked to speak with the hospitalist on call if the hospitalist that took care of you is not available. Once you are discharged, your primary care physician will handle any further medical issues. Please note that NO REFILLS for any discharge medications will be authorized once you are discharged, as it is imperative that you return to your primary care physician (or establish a relationship with a primary care physician if you do not have one) for your aftercare needs so that they can reassess your need for medications and monitor your lab values.  Discharge Instructions    Diet - low sodium heart healthy   Complete by:  As directed    Increase activity slowly   Complete by:  As directed    Increase activity slowly   Complete by:  As directed      Allergies as of 06/09/2018      Reactions   Levaquin [levofloxacin]    Out of mind      Medication List    STOP taking these medications   alfuzosin 10 MG 24 hr tablet Commonly known as:  UROXATRAL   midodrine 5 MG tablet Commonly known as:  PROAMATINE   oxyCODONE 5 MG immediate release tablet Commonly known as:  Oxy IR/ROXICODONE   rOPINIRole 0.5 MG tablet  Commonly known as:  REQUIP   zolpidem 5 MG tablet Commonly known as:  AMBIEN     TAKE these medications   acetaminophen 650 MG CR tablet Commonly known as:  TYLENOL Take 650 mg by mouth at bedtime as needed for pain.   APRISO 0.375 g 24 hr capsule Generic drug:  mesalamine Take 1,500 mg by mouth daily. pain   beclomethasone 40 MCG/ACT inhaler Commonly known as:  QVAR Take 2 puffs first thing in am and then another 2 puffs about 12 hours later.   buPROPion 150 MG 12 hr tablet Commonly known as:  WELLBUTRIN SR Take 150 mg by mouth 2 (two) times daily.   cephALEXin 500 MG capsule Commonly known as:   KEFLEX Take 1 capsule (500 mg total) by mouth every 12 (twelve) hours for 3 days.   clopidogrel 75 MG tablet Commonly known as:  PLAVIX Take 75 mg by mouth daily with breakfast.   glipiZIDE 10 MG tablet Commonly known as:  GLUCOTROL Take 10 mg by mouth 2 (two) times daily before a meal.   Magnesium Oxide 400 MG Caps Take 1 capsule (400 mg total) by mouth at bedtime.   metFORMIN 1000 MG tablet Commonly known as:  GLUCOPHAGE Take 1,000 mg by mouth 2 (two) times daily with a meal.   pantoprazole 40 MG tablet Commonly known as:  PROTONIX Take 40 mg by mouth daily.   PROAIR HFA 108 (90 Base) MCG/ACT inhaler Generic drug:  albuterol Inhale 2 puffs into the lungs every 6 (six) hours as needed for wheezing or shortness of breath.   ranitidine 300 MG tablet Commonly known as:  ZANTAC Take 300 mg by mouth at bedtime.   tamsulosin 0.4 MG Caps capsule Commonly known as:  FLOMAX Take 0.4 mg by mouth daily.   temazepam 30 MG capsule Commonly known as:  RESTORIL Take 30 mg by mouth at bedtime as needed for sleep.      Allergies  Allergen Reactions  . Levaquin [Levofloxacin]     Out of mind   Follow-up Information    Myrlene Broker, MD Follow up in 1 week(s).   Specialty:  Family Medicine Why:  hospital discharge follow up, repeat cbc/bmp at follow up PCP to assist ALF placement Contact information: [place] [address] [phone]        follow up with neurology for memory eval Follow up.            The results of significant diagnostics from this hospitalization (including imaging, microbiology, ancillary and laboratory) are listed below for reference.    Significant Diagnostic Studies: Dg Chest 2 View  Result Date: 06/05/2018 CLINICAL DATA:  Cough for 3-4 days EXAM: CHEST - 2 VIEW COMPARISON:  None. FINDINGS: Normal heart size and mediastinal contours. No acute infiltrate or edema. No effusion or pneumothorax. No acute osseous findings.  IMPRESSION: Negative chest. Electronically Signed   By: Monte Fantasia M.D.   On: 06/05/2018 19:16   Ct Head Wo Contrast  Result Date: 06/06/2018 CLINICAL DATA:  3-4 days of increased weakness, decreased appetite and urinary incontinence with EXAM: CT HEAD WITHOUT CONTRAST TECHNIQUE: Contiguous axial images were obtained from the base of the skull through the vertex without intravenous contrast. COMPARISON:  None. FINDINGS: Brain: Stable superficial and central atrophy. No acute intracranial hemorrhage, midline shift, mass effect or edema. No large vascular territory infarct. No hydrocephalus. Chronic right-sided lacunar infarcts near the genu of the right internal capsule with similar stable  lacunar infarcts involving right external capsule. Chronic periventricular white matter hypodensities consistent with small vessel ischemia. Vascular: Dolichoectasia and atherosclerotic calcifications at the vertebrobasilar junction as before with calcified plaque, unchanged in appearance. No hyperdense vessel sign. Skull: Intact Sinuses/Orbits: Partially included soft tissue density along the lateral wall of the right maxillary sinus likely representing small mucous retention cyst. Mild right maxillary sinus wall thickening likely reflective of chronic sinusitis. Otherwise, paranasal sinuses appear clear. The orbits and globes are symmetric in appearance. Other: Mastoids are clear. IMPRESSION: Chronic white matter small vessel ischemic disease without acute intracranial abnormality. Electronically Signed   By: Ashley Royalty M.D.   On: 06/06/2018 01:17   Ct Abdomen Pelvis W Contrast  Result Date: 06/06/2018 CLINICAL DATA:  Acute onset of generalized weakness and decreased appetite. Urinary incontinence and dysuria. Confusion. Generalized abdominal pain and vomiting. EXAM: CT ABDOMEN AND PELVIS WITH CONTRAST TECHNIQUE: Multidetector CT imaging of the abdomen and pelvis was performed using the standard protocol following  bolus administration of intravenous contrast. CONTRAST:  168m OMNIPAQUE IOHEXOL 300 MG/ML  SOLN COMPARISON:  None. FINDINGS: Lower chest: Small nodules at the right lung base measure up to 6 mm in size. Minimal hazy opacities are noted at the lung bases bilaterally, which may reflect an atypical infectious process. Diffuse coronary artery calcifications are seen. Hepatobiliary: The liver is unremarkable in appearance. The gallbladder is unremarkable in appearance. The common bile duct remains normal in caliber. Pancreas: The pancreas is within normal limits. Spleen: The spleen is unremarkable in appearance. Adrenals/Urinary Tract: The adrenal glands are unremarkable in appearance. Left renal cysts are noted. Nonspecific perinephric stranding is noted bilaterally. There is no evidence of hydronephrosis. No renal or ureteral stones are identified. Stomach/Bowel: The stomach is unremarkable in appearance. The small bowel is within normal limits. The patient is status post appendectomy. The colon is unremarkable in appearance. Vascular/Lymphatic: Scattered calcification is seen along the abdominal aorta and its branches. The abdominal aorta is otherwise grossly unremarkable. The inferior vena cava is grossly unremarkable. No retroperitoneal lymphadenopathy is seen. No pelvic sidewall lymphadenopathy is identified. Reproductive: The bladder is mildly distended and grossly unremarkable. The prostate is borderline normal in size, with minimal calcification. Other: No additional soft tissue abnormalities are seen. Musculoskeletal: No acute osseous abnormalities are identified. The visualized musculature is unremarkable in appearance. IMPRESSION: 1. Minimal hazy opacities at the lung bases bilaterally, which may reflect an atypical infectious process. Would correlate with the patient's symptoms. 2. No acute abnormality seen within the abdomen or pelvis. 3. Small nodules at the right lung base measure up to 6 mm in size.  Non-contrast chest CT at 3-6 months is recommended. If the nodules are stable at time of repeat CT, then future CT at 18-24 months (from today's scan) is considered optional for low-risk patients, but is recommended for high-risk patients. This recommendation follows the consensus statement: Guidelines for Management of Incidental Pulmonary Nodules Detected on CT Images: From the Fleischner Society 2017; Radiology 2017; 284:228-243. 4. Diffuse coronary artery calcifications seen. 5. Left renal cysts noted. Aortic Atherosclerosis (ICD10-I70.0). Electronically Signed   By: JGarald BaldingM.D.   On: 06/06/2018 01:16    Microbiology: Recent Results (from the past 240 hour(s))  Urine culture     Status: Abnormal   Collection Time: 06/05/18  8:50 PM  Result Value Ref Range Status   Specimen Description URINE, RANDOM  Final   Special Requests   Final    NONE Performed at [facility] [address] [place], [place] [address]    Culture MULTIPLE SPECIES PRESENT, SUGGEST RECOLLECTION (A)  Final   Report Status 06/07/2018 FINAL  Final  Blood Culture (routine x 2)     Status: None (Preliminary result)   Collection Time: 06/05/18 11:10 PM  Result Value Ref Range Status   Specimen Description BLOOD RIGHT ARM  Final   Special Requests   Final    BOTTLES DRAWN AEROBIC AND ANAEROBIC Blood Culture adequate volume   Culture   Final    NO GROWTH 3 DAYS Performed at [facility], [address], [place], [place] [address]    Report Status PENDING  Incomplete  Blood Culture (routine x 2)     Status: None (Preliminary result)   Collection Time: 06/05/18 11:15 PM  Result Value Ref Range Status   Specimen Description BLOOD BLOOD LEFT FOREARM  Final   Special Requests   Final    BOTTLES DRAWN AEROBIC AND ANAEROBIC Blood Culture adequate volume   Culture   Final    NO GROWTH 3 DAYS Performed at [facility], [place] [address], [place], [place] [address]    Report Status PENDING  Incomplete      Labs: Basic Metabolic Panel: Recent Labs  Lab 06/05/18 1826 06/06/18 0707 06/08/18 0441 06/09/18 0514  NA 136 142 141 140  K 4.0 3.8 3.4* 3.7  CL 99 106 104 105  CO2 22 23 22 26   GLUCOSE 181* 98 120* 146*  BUN 14 8 9 9   CREATININE 0.82 0.72 0.75 0.79  CALCIUM 10.1 8.6* 9.0 9.0  MG  --   --  1.4* 1.7   Liver Function Tests: Recent Labs  Lab 06/05/18 1826 06/06/18 0707  AST 13* 10*  ALT 11 10  ALKPHOS 72 55  BILITOT 0.7 0.8  PROT 6.8 5.6*  ALBUMIN 3.7 3.0*   No results for input(s): LIPASE, AMYLASE in the last 168 hours. No results for input(s): AMMONIA in the last 168 hours. CBC: Recent Labs  Lab 06/05/18 1826 06/08/18 0441 06/09/18 0514  WBC 11.9* 7.5 8.5  NEUTROABS 9.0* 5.0  --   HGB 11.6* 10.4* 10.5*  HCT 37.4* 33.3* 33.6*  MCV 83.7 82.0 82.2  PLT 246 193 185   Cardiac Enzymes: No results for input(s): CKTOTAL, CKMB, CKMBINDEX, TROPONINI in the last 168 hours. BNP: BNP (last 3 results) No results for input(s): BNP in the last 8760 hours.  ProBNP (last 3 results) No results for input(s): PROBNP in the last 8760 hours.  CBG: Recent Labs  Lab 06/08/18 0642 06/08/18 1155 06/08/18 1642 06/08/18 2102 06/09/18 0803  GLUCAP 122* 192* 204* 171* 145*       Signed:  Florencia Reasons MD, PhD  [facility] 06/09/2018, 9:58 AM

## 2018-06-11 LAB — CULTURE, BLOOD (ROUTINE X 2)
CULTURE: NO GROWTH
CULTURE: NO GROWTH
SPECIAL REQUESTS: ADEQUATE
Special Requests: ADEQUATE

## 2018-06-14 ENCOUNTER — Other Ambulatory Visit: Payer: Self-pay

## 2018-06-14 NOTE — Patient Outreach (Addendum)
[facility]) Care Management  06/14/2018  Frank Phillips 17-Nov-1945 [id number]   Telephone Screen  Referral Date: 06/14/18 Referral Source: Verbal referral from [facility] Referral Reason: " patient has dementia and lives with wife who has recently fell ill, Daughter would to to know if patient could possibly get assistance with finding community resources for assisted living since he does not currently qualify for [facility]" Insurance: HTA   Outreach attempt # 1 to patient. Spoke with patient who was alert & oriented. Discussed purpose of call and referral reason. Patient gave verbal consent to contact his daughter-Jennifer to further discuss referral. Outreach attempt to Hanna at number provided. No answer at present. RN CM left HIPAA compliant voicemail message along with contact info.    Plan: RN CM will make outreach attempt to patient/daughter within 3-4 business days.  RN CM will send unsuccessful outreach letter to patient.    Antionette Fairy, RN,BSN,CCM [facility] Care Management Telephonic Care Management Coordinator Direct Phone: [phone] Toll Free: [phone] Fax: [phone]

## 2018-06-14 NOTE — Patient Outreach (Signed)
[facility]) Care Management  06/14/2018  Frank Phillips [birth year] [id number]   Telephone Screen  Referral Date: 06/14/18 Referral Source: Verbal referral from [facility] Referral Reason: " patient has dementia and lives with wife who has recently fell ill, Daughter would to know if patient could possibly get assistance with finding community resources for assisted living since he does not currently qualify for [facility]" Insurance: HTA  Incoming call from Social research officer, government returning RN CM call. Screening call completed with daughter.   Social: Patient resides in his home. He lives with his spouse who was his main caregiver until recently. She was diagnosed with glioblastoma a few weeks and per daughter is declining rapidly. Daughter states that she lives an hour away and has been coming to help both of her parents out. However, once school starts back she will no longer be able to do so. Patient also has a son that stops by in the evenings to make sure patient and his spouse get dinner and take evening meds. Caregiver stets she has a paid caregiver that comes in about 4hrs in the morning to help assist her mother. Daughter states that she is aware that her mother will not be around much longer. She is concerned about patient being in the home alone. She wishes to keep patient in the home for as long as possible but is realistic that at some point patient may need placement. Daughter inquiring about any type of in home support, community resources, adult day programs, etc that patient may be eligible for to assist him in the home. She is currently the one taking him to MD appts. She states that patient is ambulatory and has no issues walking. No recent falls. He is still able to provide his ADLs and most IADLs.   Conditions: Per chart review, patient has PMH of DM, GERD, Depression, lung nodule, failure to thrive, aortic atherosclerosis, dementia, stroke and Diabetes. Daughter voices that  patient's medical conditions are managed at present and the only main concern is his dementia will worsen. She reports that patient has cbg meter in the home but refuses to check his blood sugars and refuses to let anyone else check it. Per records, last A1C was 5.2. Patient was recently hospitalized on 86/19-8/10/19 for a UTI. (PCP office does TOC).  Medications: Daughter states that [facility] RN has gotten "meds straighten out." Patient will begin using bubble pack med administration. She denies any other issues or concerns regarding meds.    Appointments; Patient followed by PCP as well as pain mgmt MD.  Advance Directives: She voices that patient has filled out forms but there was a mistake on the signature part and she plans to get that fixed and notary this week.  Consent: [facility] services reviewed and discussed. Daughter agreeable to [facility] SW referral.  Plan: RN CM will send [facility] SW referral for possible in home support assistance, community resources, adult day programs, etc to assist patient.   Antionette Fairy, RN,BSN,CCM [facility] Care Management Telephonic Care Management Coordinator Direct Phone: [phone] Toll Free: [phone] Fax: [phone]

## 2018-06-18 DIAGNOSIS — K5090 Crohn's disease, unspecified, without complications: Secondary | ICD-10-CM | POA: Diagnosis not present

## 2018-06-18 DIAGNOSIS — I700 Atherosclerosis of aorta: Secondary | ICD-10-CM | POA: Diagnosis not present

## 2018-06-18 DIAGNOSIS — Z8673 Personal history of transient ischemic attack (TIA), and cerebral infarction without residual deficits: Secondary | ICD-10-CM | POA: Diagnosis not present

## 2018-06-18 DIAGNOSIS — R413 Other amnesia: Secondary | ICD-10-CM | POA: Diagnosis not present

## 2018-06-18 DIAGNOSIS — R911 Solitary pulmonary nodule: Secondary | ICD-10-CM | POA: Diagnosis not present

## 2018-06-18 DIAGNOSIS — I951 Orthostatic hypotension: Secondary | ICD-10-CM | POA: Diagnosis not present

## 2018-06-18 DIAGNOSIS — E872 Acidosis: Secondary | ICD-10-CM | POA: Diagnosis not present

## 2018-06-18 DIAGNOSIS — F0150 Vascular dementia without behavioral disturbance: Secondary | ICD-10-CM | POA: Diagnosis not present

## 2018-06-18 DIAGNOSIS — N390 Urinary tract infection, site not specified: Secondary | ICD-10-CM | POA: Diagnosis not present

## 2018-06-18 DIAGNOSIS — E119 Type 2 diabetes mellitus without complications: Secondary | ICD-10-CM | POA: Diagnosis not present

## 2018-06-18 DIAGNOSIS — R319 Hematuria, unspecified: Secondary | ICD-10-CM | POA: Diagnosis not present

## 2018-06-18 DIAGNOSIS — G9341 Metabolic encephalopathy: Secondary | ICD-10-CM | POA: Diagnosis not present

## 2018-06-18 DIAGNOSIS — E782 Mixed hyperlipidemia: Secondary | ICD-10-CM | POA: Diagnosis not present

## 2018-06-18 DIAGNOSIS — Z09 Encounter for follow-up examination after completed treatment for conditions other than malignant neoplasm: Secondary | ICD-10-CM | POA: Diagnosis not present

## 2018-06-18 DIAGNOSIS — R627 Adult failure to thrive: Secondary | ICD-10-CM | POA: Diagnosis not present

## 2018-06-19 ENCOUNTER — Other Ambulatory Visit: Payer: Self-pay

## 2018-06-19 NOTE — Patient Outreach (Signed)
[facility]) Care Management  06/19/2018  Frank Phillips [birth year] [id number]  Successful outreach to the patient on today's date, HIPAA identifiers confirmed. BSW introduced self and the reason for today's call. The patient gave verbal permission for BSW to speak with his daughter, Frank Phillips. BSW placed a successful call to Frank Phillips who was able to provide the patients date of birth and address.   Mrs. Frank Phillips indicates the patient has early signs of dementia with "severe short-term memory loss". The patient currently lives at home and would like to remain in the home as long as possible. The patients main caregiver, his spouse, has recently been diagnosed with a brain tumor and is declining rapidly. Mrs. Frank Phillips states "I didn't realize how much she had done for him". Mrs. Frank Phillips indicates her mother has rapidly declined causing round the clock care. Mrs. Frank Phillips also states she does not feel her mother will live longer than 3 weeks. Mrs. Frank Phillips is concerned how her father will respond to the death of his wife. Currently, the patient does not remember to eat or take his medicine.  BSW spoke with Mrs. Frank Phillips about the difference in a senior center and an adult day program. Mrs. Frank Phillips is adamant her father is too high functioning for a day program and would benefit from a senior center. BSW educated Mrs. Frank Phillips on the [employer] center including activities, congregate meals, and transportation provided by [employer] also briefly discussed the home care program provided by [employer] as well as [employer]. BSW to mail resources to Mrs. Frank Phillips home address [facility] [place] [phone]) for her review. BSW to call Mrs. Frank Phillips in the next two weeks to discuss and assist with linking to desired resources.  Frank Phillips, BSW, CDP [facility] [phone]

## 2018-07-03 ENCOUNTER — Ambulatory Visit: Payer: Self-pay

## 2018-07-03 ENCOUNTER — Other Ambulatory Visit: Payer: Self-pay

## 2018-07-03 NOTE — Patient Outreach (Signed)
[facility]) Care Management  07/03/2018  REYNARD CHRISTOFFERSEN 26-Nov-1945 [id number]  BSW placed a call to the patients daughter, Gibson Ramp to confirm mailed resources were received. Unfortunately, Mrs. Thersa Salt did not answer. BSW left a HIPAA compliant voice message requesting a return call. BSW to place a follow-up call within the next four business days.  Bevelyn Ngo, BSW, CDP [facility] [phone]

## 2018-07-04 DIAGNOSIS — Z87440 Personal history of urinary (tract) infections: Secondary | ICD-10-CM | POA: Diagnosis not present

## 2018-07-04 DIAGNOSIS — R3912 Poor urinary stream: Secondary | ICD-10-CM | POA: Diagnosis not present

## 2018-07-04 DIAGNOSIS — N401 Enlarged prostate with lower urinary tract symptoms: Secondary | ICD-10-CM | POA: Diagnosis not present

## 2018-07-06 DIAGNOSIS — J189 Pneumonia, unspecified organism: Secondary | ICD-10-CM | POA: Diagnosis not present

## 2018-07-06 DIAGNOSIS — R319 Hematuria, unspecified: Secondary | ICD-10-CM | POA: Diagnosis not present

## 2018-07-06 DIAGNOSIS — A419 Sepsis, unspecified organism: Secondary | ICD-10-CM | POA: Diagnosis not present

## 2018-07-06 DIAGNOSIS — N390 Urinary tract infection, site not specified: Secondary | ICD-10-CM | POA: Diagnosis not present

## 2018-07-09 ENCOUNTER — Other Ambulatory Visit: Payer: Self-pay

## 2018-07-09 ENCOUNTER — Ambulatory Visit: Payer: Self-pay

## 2018-07-09 NOTE — Patient Outreach (Signed)
[facility]) Care Management  07/09/2018  LEIAM RITTENHOUSE Mar 24, 1946 [id number]  BSW attempted to contact the patients daughter, Victorino Dike, to follow up on receipt of mailed resources. BSW left a HIPAA compliant voice message requesting a return call.  Plan: BSW will perform a case closure in the next four days if not return call is received.  Bevelyn Ngo, BSW, CDP [facility] [phone]

## 2018-07-13 ENCOUNTER — Other Ambulatory Visit: Payer: Self-pay

## 2018-07-13 NOTE — Patient Outreach (Signed)
[facility]) Care Management  07/13/2018  Frank Phillips Sep 28, 1946 [id number]  BSW to perform a case closure on today's date due to an inability to maintain contact.   Bevelyn Ngo, BSW, CDP [facility] [phone]

## 2018-07-27 DIAGNOSIS — M450 Ankylosing spondylitis of multiple sites in spine: Secondary | ICD-10-CM | POA: Diagnosis not present

## 2018-07-27 DIAGNOSIS — M129 Arthropathy, unspecified: Secondary | ICD-10-CM | POA: Diagnosis not present

## 2018-08-06 DIAGNOSIS — M6281 Muscle weakness (generalized): Secondary | ICD-10-CM | POA: Diagnosis not present

## 2018-08-06 DIAGNOSIS — M545 Low back pain: Secondary | ICD-10-CM | POA: Diagnosis not present

## 2018-08-06 DIAGNOSIS — R2689 Other abnormalities of gait and mobility: Secondary | ICD-10-CM | POA: Diagnosis not present

## 2018-08-07 DIAGNOSIS — K219 Gastro-esophageal reflux disease without esophagitis: Secondary | ICD-10-CM | POA: Diagnosis not present

## 2018-08-07 DIAGNOSIS — K5080 Crohn's disease of both small and large intestine without complications: Secondary | ICD-10-CM | POA: Diagnosis not present

## 2018-08-07 DIAGNOSIS — Z8711 Personal history of peptic ulcer disease: Secondary | ICD-10-CM | POA: Diagnosis not present

## 2018-08-09 DIAGNOSIS — M6281 Muscle weakness (generalized): Secondary | ICD-10-CM | POA: Diagnosis not present

## 2018-08-09 DIAGNOSIS — R2689 Other abnormalities of gait and mobility: Secondary | ICD-10-CM | POA: Diagnosis not present

## 2018-08-09 DIAGNOSIS — M545 Low back pain: Secondary | ICD-10-CM | POA: Diagnosis not present

## 2018-08-17 DIAGNOSIS — M545 Low back pain: Secondary | ICD-10-CM | POA: Diagnosis not present

## 2018-08-17 DIAGNOSIS — R2689 Other abnormalities of gait and mobility: Secondary | ICD-10-CM | POA: Diagnosis not present

## 2018-08-17 DIAGNOSIS — M6281 Muscle weakness (generalized): Secondary | ICD-10-CM | POA: Diagnosis not present

## 2018-08-20 DIAGNOSIS — M450 Ankylosing spondylitis of multiple sites in spine: Secondary | ICD-10-CM | POA: Diagnosis not present

## 2018-08-21 DIAGNOSIS — R2689 Other abnormalities of gait and mobility: Secondary | ICD-10-CM | POA: Diagnosis not present

## 2018-08-21 DIAGNOSIS — M6281 Muscle weakness (generalized): Secondary | ICD-10-CM | POA: Diagnosis not present

## 2018-08-21 DIAGNOSIS — M545 Low back pain: Secondary | ICD-10-CM | POA: Diagnosis not present

## 2018-08-23 DIAGNOSIS — R2689 Other abnormalities of gait and mobility: Secondary | ICD-10-CM | POA: Diagnosis not present

## 2018-08-23 DIAGNOSIS — M6281 Muscle weakness (generalized): Secondary | ICD-10-CM | POA: Diagnosis not present

## 2018-08-23 DIAGNOSIS — M545 Low back pain: Secondary | ICD-10-CM | POA: Diagnosis not present

## 2018-08-27 DIAGNOSIS — M545 Low back pain: Secondary | ICD-10-CM | POA: Diagnosis not present

## 2018-08-27 DIAGNOSIS — R2689 Other abnormalities of gait and mobility: Secondary | ICD-10-CM | POA: Diagnosis not present

## 2018-08-27 DIAGNOSIS — M6281 Muscle weakness (generalized): Secondary | ICD-10-CM | POA: Diagnosis not present

## 2018-08-31 ENCOUNTER — Other Ambulatory Visit: Payer: Self-pay

## 2018-09-04 DIAGNOSIS — R2689 Other abnormalities of gait and mobility: Secondary | ICD-10-CM | POA: Diagnosis not present

## 2018-09-04 DIAGNOSIS — M6281 Muscle weakness (generalized): Secondary | ICD-10-CM | POA: Diagnosis not present

## 2018-09-04 DIAGNOSIS — M545 Low back pain: Secondary | ICD-10-CM | POA: Diagnosis not present

## 2018-09-06 DIAGNOSIS — R2689 Other abnormalities of gait and mobility: Secondary | ICD-10-CM | POA: Diagnosis not present

## 2018-09-06 DIAGNOSIS — M545 Low back pain: Secondary | ICD-10-CM | POA: Diagnosis not present

## 2018-09-06 DIAGNOSIS — M6281 Muscle weakness (generalized): Secondary | ICD-10-CM | POA: Diagnosis not present

## 2018-09-13 DIAGNOSIS — G3184 Mild cognitive impairment, so stated: Secondary | ICD-10-CM | POA: Diagnosis not present

## 2018-09-13 DIAGNOSIS — F4321 Adjustment disorder with depressed mood: Secondary | ICD-10-CM | POA: Diagnosis not present

## 2018-09-17 DIAGNOSIS — M6281 Muscle weakness (generalized): Secondary | ICD-10-CM | POA: Diagnosis not present

## 2018-09-17 DIAGNOSIS — M545 Low back pain: Secondary | ICD-10-CM | POA: Diagnosis not present

## 2018-09-17 DIAGNOSIS — R2689 Other abnormalities of gait and mobility: Secondary | ICD-10-CM | POA: Diagnosis not present

## 2018-09-19 DIAGNOSIS — M545 Low back pain: Secondary | ICD-10-CM | POA: Diagnosis not present

## 2018-09-19 DIAGNOSIS — M6281 Muscle weakness (generalized): Secondary | ICD-10-CM | POA: Diagnosis not present

## 2018-09-19 DIAGNOSIS — R2689 Other abnormalities of gait and mobility: Secondary | ICD-10-CM | POA: Diagnosis not present

## 2018-11-01 DIAGNOSIS — E1165 Type 2 diabetes mellitus with hyperglycemia: Secondary | ICD-10-CM | POA: Diagnosis not present

## 2018-11-01 DIAGNOSIS — F5104 Psychophysiologic insomnia: Secondary | ICD-10-CM | POA: Insufficient documentation

## 2018-12-18 DIAGNOSIS — L570 Actinic keratosis: Secondary | ICD-10-CM | POA: Diagnosis not present

## 2018-12-18 DIAGNOSIS — M129 Arthropathy, unspecified: Secondary | ICD-10-CM | POA: Diagnosis not present

## 2018-12-18 DIAGNOSIS — F0390 Unspecified dementia without behavioral disturbance: Secondary | ICD-10-CM | POA: Diagnosis not present

## 2018-12-18 DIAGNOSIS — F5104 Psychophysiologic insomnia: Secondary | ICD-10-CM | POA: Diagnosis not present

## 2018-12-18 DIAGNOSIS — E1165 Type 2 diabetes mellitus with hyperglycemia: Secondary | ICD-10-CM | POA: Diagnosis not present

## 2018-12-18 DIAGNOSIS — K219 Gastro-esophageal reflux disease without esophagitis: Secondary | ICD-10-CM | POA: Diagnosis not present

## 2019-03-13 DIAGNOSIS — F331 Major depressive disorder, recurrent, moderate: Secondary | ICD-10-CM | POA: Diagnosis not present

## 2019-03-13 DIAGNOSIS — R419 Unspecified symptoms and signs involving cognitive functions and awareness: Secondary | ICD-10-CM | POA: Diagnosis not present

## 2019-03-18 DIAGNOSIS — F331 Major depressive disorder, recurrent, moderate: Secondary | ICD-10-CM | POA: Diagnosis not present

## 2019-03-18 DIAGNOSIS — F4321 Adjustment disorder with depressed mood: Secondary | ICD-10-CM | POA: Diagnosis not present

## 2019-03-26 DIAGNOSIS — F331 Major depressive disorder, recurrent, moderate: Secondary | ICD-10-CM | POA: Diagnosis not present

## 2019-03-26 DIAGNOSIS — F4321 Adjustment disorder with depressed mood: Secondary | ICD-10-CM | POA: Diagnosis not present

## 2019-03-27 DIAGNOSIS — G3184 Mild cognitive impairment, so stated: Secondary | ICD-10-CM | POA: Diagnosis not present

## 2019-03-27 DIAGNOSIS — K5090 Crohn's disease, unspecified, without complications: Secondary | ICD-10-CM | POA: Diagnosis not present

## 2019-03-27 DIAGNOSIS — E1165 Type 2 diabetes mellitus with hyperglycemia: Secondary | ICD-10-CM | POA: Diagnosis not present

## 2019-03-27 DIAGNOSIS — Z7189 Other specified counseling: Secondary | ICD-10-CM | POA: Diagnosis not present

## 2019-04-08 DIAGNOSIS — F331 Major depressive disorder, recurrent, moderate: Secondary | ICD-10-CM | POA: Diagnosis not present

## 2019-04-08 DIAGNOSIS — F4321 Adjustment disorder with depressed mood: Secondary | ICD-10-CM | POA: Diagnosis not present

## 2019-04-25 DIAGNOSIS — R5381 Other malaise: Secondary | ICD-10-CM | POA: Insufficient documentation

## 2019-04-25 DIAGNOSIS — Z125 Encounter for screening for malignant neoplasm of prostate: Secondary | ICD-10-CM | POA: Diagnosis not present

## 2019-04-25 DIAGNOSIS — R5383 Other fatigue: Secondary | ICD-10-CM | POA: Diagnosis not present

## 2019-04-25 DIAGNOSIS — F3341 Major depressive disorder, recurrent, in partial remission: Secondary | ICD-10-CM | POA: Diagnosis not present

## 2019-04-25 DIAGNOSIS — I10 Essential (primary) hypertension: Secondary | ICD-10-CM | POA: Diagnosis not present

## 2019-04-25 DIAGNOSIS — Z0000 Encounter for general adult medical examination without abnormal findings: Secondary | ICD-10-CM | POA: Diagnosis not present

## 2019-04-25 DIAGNOSIS — F5104 Psychophysiologic insomnia: Secondary | ICD-10-CM | POA: Diagnosis not present

## 2019-04-25 DIAGNOSIS — E782 Mixed hyperlipidemia: Secondary | ICD-10-CM | POA: Diagnosis not present

## 2019-04-25 DIAGNOSIS — Z1211 Encounter for screening for malignant neoplasm of colon: Secondary | ICD-10-CM | POA: Diagnosis not present

## 2019-04-25 DIAGNOSIS — E1165 Type 2 diabetes mellitus with hyperglycemia: Secondary | ICD-10-CM | POA: Diagnosis not present

## 2019-04-25 DIAGNOSIS — K219 Gastro-esophageal reflux disease without esophagitis: Secondary | ICD-10-CM | POA: Diagnosis not present

## 2019-04-26 DIAGNOSIS — F331 Major depressive disorder, recurrent, moderate: Secondary | ICD-10-CM | POA: Diagnosis not present

## 2019-04-26 DIAGNOSIS — F4321 Adjustment disorder with depressed mood: Secondary | ICD-10-CM | POA: Diagnosis not present

## 2019-05-02 DIAGNOSIS — F331 Major depressive disorder, recurrent, moderate: Secondary | ICD-10-CM | POA: Diagnosis not present

## 2019-05-02 DIAGNOSIS — F4321 Adjustment disorder with depressed mood: Secondary | ICD-10-CM | POA: Diagnosis not present

## 2019-05-05 ENCOUNTER — Emergency Department (HOSPITAL_COMMUNITY): Payer: PPO

## 2019-05-05 ENCOUNTER — Encounter (HOSPITAL_COMMUNITY): Payer: Self-pay | Admitting: Emergency Medicine

## 2019-05-05 ENCOUNTER — Emergency Department (HOSPITAL_COMMUNITY)
Admission: EM | Admit: 2019-05-05 | Discharge: 2019-05-07 | Disposition: A | Discharge status: Other Acute Inpatient Hospital | Payer: PPO | Attending: Emergency Medicine | Admitting: Emergency Medicine

## 2019-05-05 DIAGNOSIS — Z008 Encounter for other general examination: Secondary | ICD-10-CM | POA: Diagnosis present

## 2019-05-05 DIAGNOSIS — Z634 Disappearance and death of family member: Secondary | ICD-10-CM | POA: Insufficient documentation

## 2019-05-05 DIAGNOSIS — R45851 Suicidal ideations: Secondary | ICD-10-CM | POA: Diagnosis not present

## 2019-05-05 DIAGNOSIS — Z79899 Other long term (current) drug therapy: Secondary | ICD-10-CM | POA: Diagnosis not present

## 2019-05-05 DIAGNOSIS — E119 Type 2 diabetes mellitus without complications: Secondary | ICD-10-CM | POA: Insufficient documentation

## 2019-05-05 DIAGNOSIS — Z7902 Long term (current) use of antithrombotics/antiplatelets: Secondary | ICD-10-CM | POA: Insufficient documentation

## 2019-05-05 DIAGNOSIS — F0390 Unspecified dementia without behavioral disturbance: Secondary | ICD-10-CM | POA: Diagnosis not present

## 2019-05-05 DIAGNOSIS — F329 Major depressive disorder, single episode, unspecified: Secondary | ICD-10-CM

## 2019-05-05 DIAGNOSIS — R52 Pain, unspecified: Secondary | ICD-10-CM

## 2019-05-05 DIAGNOSIS — F332 Major depressive disorder, recurrent severe without psychotic features: Secondary | ICD-10-CM | POA: Insufficient documentation

## 2019-05-05 DIAGNOSIS — Z8673 Personal history of transient ischemic attack (TIA), and cerebral infarction without residual deficits: Secondary | ICD-10-CM | POA: Diagnosis not present

## 2019-05-05 DIAGNOSIS — R079 Chest pain, unspecified: Secondary | ICD-10-CM | POA: Diagnosis not present

## 2019-05-05 DIAGNOSIS — F32A Depression, unspecified: Secondary | ICD-10-CM

## 2019-05-05 DIAGNOSIS — Z7984 Long term (current) use of oral hypoglycemic drugs: Secondary | ICD-10-CM | POA: Diagnosis not present

## 2019-05-05 DIAGNOSIS — F17210 Nicotine dependence, cigarettes, uncomplicated: Secondary | ICD-10-CM | POA: Insufficient documentation

## 2019-05-05 DIAGNOSIS — Z03818 Encounter for observation for suspected exposure to other biological agents ruled out: Secondary | ICD-10-CM | POA: Diagnosis not present

## 2019-05-05 DIAGNOSIS — R001 Bradycardia, unspecified: Secondary | ICD-10-CM | POA: Diagnosis not present

## 2019-05-05 LAB — CBC
HCT: 32.8 % — ABNORMAL LOW (ref 39.0–52.0)
Hemoglobin: 9.9 g/dL — ABNORMAL LOW (ref 13.0–17.0)
MCH: 23.9 pg — ABNORMAL LOW (ref 26.0–34.0)
MCHC: 30.2 g/dL (ref 30.0–36.0)
MCV: 79.2 fL — ABNORMAL LOW (ref 80.0–100.0)
Platelets: 262 10*3/uL (ref 150–400)
RBC: 4.14 MIL/uL — ABNORMAL LOW (ref 4.22–5.81)
RDW: 15.5 % (ref 11.5–15.5)
WBC: 8.3 10*3/uL (ref 4.0–10.5)
nRBC: 0 % (ref 0.0–0.2)

## 2019-05-05 LAB — RAPID URINE DRUG SCREEN, HOSP PERFORMED
Amphetamines: NOT DETECTED
Barbiturates: NOT DETECTED
Benzodiazepines: POSITIVE — AB
Cocaine: NOT DETECTED
Opiates: NOT DETECTED
Tetrahydrocannabinol: NOT DETECTED

## 2019-05-05 LAB — COMPREHENSIVE METABOLIC PANEL
ALT: 13 U/L (ref 0–44)
AST: 15 U/L (ref 15–41)
Albumin: 3.5 g/dL (ref 3.5–5.0)
Alkaline Phosphatase: 73 U/L (ref 38–126)
Anion gap: 10 (ref 5–15)
BUN: 10 mg/dL (ref 8–23)
CO2: 24 mmol/L (ref 22–32)
Calcium: 9.2 mg/dL (ref 8.9–10.3)
Chloride: 104 mmol/L (ref 98–111)
Creatinine, Ser: 0.8 mg/dL (ref 0.61–1.24)
GFR calc Af Amer: >60 mL/min (ref >60)
GFR calc non Af Amer: >60 mL/min (ref >60)
Glucose, Bld: 211 mg/dL — ABNORMAL HIGH (ref 70–99)
Potassium: 4.1 mmol/L (ref 3.5–5.1)
Sodium: 138 mmol/L (ref 135–145)
Total Bilirubin: 0.6 mg/dL (ref 0.3–1.2)
Total Protein: 6.3 g/dL — ABNORMAL LOW (ref 6.5–8.1)

## 2019-05-05 LAB — ETHANOL: Alcohol, Ethyl (B): <10 mg/dL (ref <10)

## 2019-05-05 NOTE — ED Notes (Signed)
No answer in WR

## 2019-05-05 NOTE — ED Triage Notes (Signed)
Pt presents with Frank Phillips (daughter), pt unwilling to answer questions at this time.  Per daughter pt's wife died last July 24, 2023 now is calling daughter at least twice a month threatening to kill himself. She feel he may have some early dementia.  Pt does have multiple weapons in the home.

## 2019-05-06 LAB — SARS CORONAVIRUS 2 BY RT PCR (HOSPITAL ORDER, PERFORMED IN ~~LOC~~ HOSPITAL LAB): SARS Coronavirus 2: NEGATIVE

## 2019-05-06 MED ORDER — LORAZEPAM 2 MG/ML IJ SOLN
2.0000 mg | Freq: Once | INTRAMUSCULAR | Status: AC
  Administered 2019-05-06: 2 mg via INTRAMUSCULAR
  Filled 2019-05-06: qty 1

## 2019-05-06 MED ORDER — MESALAMINE 1.2 G PO TBEC
2.4000 g | DELAYED_RELEASE_TABLET | Freq: Every day | ORAL | Status: DC
  Administered 2019-05-06: 2.4 g via ORAL
  Filled 2019-05-06 (×2): qty 2

## 2019-05-06 MED ORDER — HALOPERIDOL LACTATE 5 MG/ML IJ SOLN
2.0000 mg | Freq: Once | INTRAMUSCULAR | Status: AC
  Administered 2019-05-06: 2 mg via INTRAMUSCULAR
  Filled 2019-05-06: qty 1

## 2019-05-06 MED ORDER — DULOXETINE HCL 30 MG PO CPEP
30.0000 mg | ORAL_CAPSULE | Freq: Two times a day (BID) | ORAL | Status: DC
  Administered 2019-05-06 (×2): 30 mg via ORAL
  Filled 2019-05-06 (×3): qty 1

## 2019-05-06 MED ORDER — TEMAZEPAM 15 MG PO CAPS
30.0000 mg | ORAL_CAPSULE | Freq: Every day | ORAL | Status: DC
  Administered 2019-05-06: 30 mg via ORAL
  Filled 2019-05-06: qty 2

## 2019-05-06 MED ORDER — CLOPIDOGREL BISULFATE 75 MG PO TABS
75.0000 mg | ORAL_TABLET | Freq: Every day | ORAL | Status: DC
  Administered 2019-05-06 – 2019-05-07 (×2): 75 mg via ORAL
  Filled 2019-05-06 (×2): qty 1

## 2019-05-06 MED ORDER — TAMSULOSIN HCL 0.4 MG PO CAPS
0.4000 mg | ORAL_CAPSULE | Freq: Every day | ORAL | Status: DC
  Administered 2019-05-06: 0.4 mg via ORAL
  Filled 2019-05-06: qty 1

## 2019-05-06 MED ORDER — GLIPIZIDE 10 MG PO TABS
10.0000 mg | ORAL_TABLET | Freq: Two times a day (BID) | ORAL | Status: DC
  Administered 2019-05-06 – 2019-05-07 (×3): 10 mg via ORAL
  Filled 2019-05-06 (×4): qty 1

## 2019-05-06 MED ORDER — MAGNESIUM OXIDE 400 (241.3 MG) MG PO TABS
400.0000 mg | ORAL_TABLET | Freq: Every day | ORAL | Status: DC
  Administered 2019-05-06: 400 mg via ORAL
  Filled 2019-05-06: qty 1

## 2019-05-06 MED ORDER — ACETAMINOPHEN 325 MG PO TABS
650.0000 mg | ORAL_TABLET | Freq: Once | ORAL | Status: AC
  Administered 2019-05-06: 18:00:00 650 mg via ORAL
  Filled 2019-05-06: qty 2

## 2019-05-06 NOTE — ED Notes (Signed)
Lunch ordered 

## 2019-05-06 NOTE — ED Notes (Signed)
Pt is now resting in bed. Calm and cooperative.

## 2019-05-06 NOTE — ED Notes (Signed)
When attempting to transport pt to [facility], pt began to yell and scream "I am an [employer] citizen". Pt became threatening to staff and states that he is leaving. Provider notified. Provider to emergency IVC pt.

## 2019-05-06 NOTE — Progress Notes (Signed)
Pt. meets criteria for inpatient treatment per Lashunda Thomas, NP.  No appropriate beds available at [facility]. Referred out to the following hospitals: [facility]    [facility]  [facility]  [facility]  [facility]  [facility]  [facility]       Disposition CSW will continue to follow for placement.  Kaliope Quinonez T. Chaniya Genter, MSW, LCSW Disposition Clinical Social Work [phone] (cell) [phone] (office)   

## 2019-05-06 NOTE — ED Notes (Signed)
Patient states has arthritis pain all over and especially his lower back achy sore 8/10. Provider notified.

## 2019-05-06 NOTE — ED Notes (Addendum)
Pt requesting nightly temazepam, informed pt it has not been ordered but that this RN would inquire about it to the MD. Pt is unsure what dose he takes.

## 2019-05-06 NOTE — ED Notes (Signed)
Pt. Is resting in bed at this moment.

## 2019-05-06 NOTE — ED Notes (Signed)
ED TO INPATIENT HANDOFF REPORT  ED Nurse Name and Phone #:  [phone]  S Name/Age/Gender Floydene FlockGary L Klomp 73 y.o. male Room/Bed: 025C/025C  Code Status   Code Status: Full Code  Home/SNF/Other Home Patient oriented to: self Is this baseline? Yes   Triage Complete: Triage complete  Chief Complaint Z04.6  Triage Note Pt presents with Victorino DikeJennifer (daughter), pt unwilling to answer questions at this time.  Per daughter pt's wife died last September now is calling daughter at least twice a month threatening to kill himself. She feel he may have some early dementia.  Pt does have multiple weapons in the home.    Allergies Allergies  Allergen Reactions  . Levaquin [Levofloxacin]     Out of mind    Level of Care/Admitting Diagnosis ED Disposition    None      B Medical/Surgery History Past Medical History:  Diagnosis Date  . Anxiety   . Aortic atherosclerosis (HCC) 06/06/2018  . Arthritis   . Complication of anesthesia    " i HAD SOME KIND OF PROBLEM ,BUT I DONT REMEMBER WHAT IT WAS "  . Crohn disease (HCC)   . Dementia (HCC)   . Depression   . Diabetes (HCC)   . Fall at home 04/06/2016  . High blood pressure   . High cholesterol   . Kidney stones   . Orthostatic hypotension 03/2016  . Stroke (HCC)   . UTI (urinary tract infection) 05/2018   WITH HEMATURIA   Past Surgical History:  Procedure Laterality Date  . ANGIOPLASTY / STENTING FEMORAL    . APPENDECTOMY    . kidney stones    . TONSILLECTOMY    . TONSILLECTOMY AND ADENOIDECTOMY       A IV Location/Drains/Wounds Patient Lines/Drains/Airways Status   Active Line/Drains/Airways    None          Intake/Output Last 24 hours No intake or output data in the 24 hours ending 05/06/19 [id number]  Labs/Imaging Results for orders placed or performed during the hospital encounter of 05/05/19 (from the past 48 hour(s))  Rapid urine drug screen (hospital performed)     Status: Abnormal   Collection Time: 05/05/19   9:47 PM  Result Value Ref Range   Opiates NONE DETECTED NONE DETECTED   Cocaine NONE DETECTED NONE DETECTED   Benzodiazepines POSITIVE (A) NONE DETECTED   Amphetamines NONE DETECTED NONE DETECTED   Tetrahydrocannabinol NONE DETECTED NONE DETECTED   Barbiturates NONE DETECTED NONE DETECTED    Comment: (NOTE) DRUG SCREEN FOR MEDICAL PURPOSES ONLY.  IF CONFIRMATION IS NEEDED FOR ANY PURPOSE, NOTIFY LAB WITHIN 5 DAYS. LOWEST DETECTABLE LIMITS FOR URINE DRUG SCREEN Drug Class                     Cutoff (ng/mL) Amphetamine and metabolites    1000 Barbiturate and metabolites    200 Benzodiazepine                 200 Tricyclics and metabolites     300 Opiates and metabolites        300 Cocaine and metabolites        300 THC                            50 Performed at [facility], [address] [place], [place] [id number]   Comprehensive metabolic panel     Status: Abnormal   Collection Time:  05/05/19  9:54 PM  Result Value Ref Range   Sodium 138 135 - 145 mmol/L   Potassium 4.1 3.5 - 5.1 mmol/L   Chloride 104 98 - 111 mmol/L   CO2 24 22 - 32 mmol/L   Glucose, Bld 211 (H) 70 - 99 mg/dL   BUN 10 8 - 23 mg/dL   Creatinine, Ser 4.81 0.61 - 1.24 mg/dL   Calcium 9.2 8.9 - 85.6 mg/dL   Total Protein 6.3 (L) 6.5 - 8.1 g/dL   Albumin 3.5 3.5 - 5.0 g/dL   AST 15 15 - 41 U/L   ALT 13 0 - 44 U/L   Alkaline Phosphatase 73 38 - 126 U/L   Total Bilirubin 0.6 0.3 - 1.2 mg/dL   GFR calc non Af Amer >60 >60 mL/min   GFR calc Af Amer >60 >60 mL/min   Anion gap 10 5 - 15    Comment: Performed at [facility], [address], [place], [place] [address]  Ethanol     Status: None   Collection Time: 05/05/19  9:54 PM  Result Value Ref Range   Alcohol, Ethyl (B) <10 <10 mg/dL    Comment: (NOTE) Lowest detectable limit for serum alcohol is 10 mg/dL. For medical purposes only. Performed at [facility], [address], [place], [place] [address]   cbc     Status: Abnormal    Collection Time: 05/05/19  9:54 PM  Result Value Ref Range   WBC 8.3 4.0 - 10.5 K/uL   RBC 4.14 (L) 4.22 - 5.81 MIL/uL   Hemoglobin 9.9 (L) 13.0 - 17.0 g/dL   HCT 85.8 (L) 85.0 - 27.7 %   MCV 79.2 (L) 80.0 - 100.0 fL   MCH 23.9 (L) 26.0 - 34.0 pg   MCHC 30.2 30.0 - 36.0 g/dL   RDW 41.2 87.8 - 67.6 %   Platelets 262 150 - 400 K/uL   nRBC 0.0 0.0 - 0.2 %    Comment: Performed at [facility], [address], [place], [place] [address]  SARS Coronavirus 2 (CEPHEID - Performed in [facility] hospital lab), Hosp Order     Status: None   Collection Time: 05/06/19 12:20 AM   Specimen: Nasopharyngeal Swab  Result Value Ref Range   SARS Coronavirus 2 NEGATIVE NEGATIVE    Comment: (NOTE) If result is NEGATIVE SARS-CoV-2 target nucleic acids are NOT DETECTED. The SARS-CoV-2 RNA is generally detectable in upper and lower  respiratory specimens during the acute phase of infection. The lowest  concentration of SARS-CoV-2 viral copies this assay can detect is 250  copies / mL. A negative result does not preclude SARS-CoV-2 infection  and should not be used as the sole basis for treatment or other  patient management decisions.  A negative result may occur with  improper specimen collection / handling, submission of specimen other  than nasopharyngeal swab, presence of viral mutation(s) within the  areas targeted by this assay, and inadequate number of viral copies  (<250 copies / mL). A negative result must be combined with clinical  observations, patient history, and epidemiological information. If result is POSITIVE SARS-CoV-2 target nucleic acids are DETECTED. The SARS-CoV-2 RNA is generally detectable in upper and lower  respiratory specimens dur ing the acute phase of infection.  Positive  results are indicative of active infection with SARS-CoV-2.  Clinical  correlation with patient history and other diagnostic information is  necessary to determine patient infection status.  Positive results do  not rule out bacterial infection or co-infection with other viruses. If result is PRESUMPTIVE POSTIVE SARS-CoV-2 nucleic acids MAY BE PRESENT.   A presumptive positive result was obtained on the submitted specimen  and confirmed on repeat testing.  While 2019 novel coronavirus  (SARS-CoV-2) nucleic acids may be present in the submitted sample  additional confirmatory testing may be necessary for epidemiological  and / or clinical management purposes  to differentiate between  SARS-CoV-2 and other Sarbecovirus currently known to infect humans.  If clinically indicated additional testing with an alternate test  methodology [id number]) is advised. The SARS-CoV-2 RNA is generally  detectable in upper and lower respiratory sp ecimens during the acute  phase of infection. The expected result is Negative. Fact Sheet for Patients:  StrictlyIdeas.no Fact Sheet for Healthcare Providers: BankingDealers.co.za This test is not yet approved or cleared by the [place] FDA and has been authorized for detection and/or diagnosis of SARS-CoV-2 by FDA under an Emergency Use Authorization (EUA).  This EUA will remain in effect (meaning this test can be used) for the duration of the COVID-19 declaration under Section 564(b)(1) of the Act, 21 U.S.C. section 360bbb-3(b)(1), unless the authorization is terminated or revoked sooner. Performed at [facility], [place] [address] [place], [place] [address]    Dg Chest Port 1 View  Result Date: 05/05/2019 CLINICAL DATA:  Chest pain EXAM: PORTABLE CHEST 1 VIEW COMPARISON:  06/05/2018 FINDINGS: The heart size and mediastinal contours are within normal limits. Aortic atherosclerosis. Mild chronic elevation of right diaphragm. Both lungs are clear. The visualized skeletal structures are unremarkable. IMPRESSION: No active disease. Electronically Signed   By: Donavan Foil M.D.   On: 05/05/2019  22:56    Pending Labs Unresulted Labs (From admission, onward)   None      Vitals/Pain Today's Vitals   05/05/19 2126 05/05/19 2221 05/05/19 2222 05/06/19 0446  BP: (!) 163/60   (!) 153/74  Pulse: (!) 58   60  Resp: 16   18  Temp: 98.3 F (36.8 C)     TempSrc: Oral     SpO2: 100%   99%  Weight:   72.7 kg   Height:   5\' 9"  (1.753 m)   PainSc:  0-No pain  0-No pain    Isolation Precautions No active isolations  Medications Medications  mesalamine (LIALDA) EC tablet 2.4 g (has no administration in time range)  clopidogrel (PLAVIX) tablet 75 mg (has no administration in time range)  DULoxetine (CYMBALTA) DR capsule 30 mg (has no administration in time range)  glipiZIDE (GLUCOTROL) tablet 10 mg (has no administration in time range)  magnesium oxide (MAG-OX) tablet 400 mg (has no administration in time range)    Mobility walks     Focused Assessments Psych   R Recommendations: See Admitting Provider Note  Report given to:   Additional Notes:

## 2019-05-06 NOTE — ED Notes (Signed)
IVC paperwork faxed to [facility]. Original IVC paperwork placed in red folder in med room. IVC paperwork sent to medical records.

## 2019-05-06 NOTE — ED Notes (Signed)
Pt. Belonging inventoried and placed in locker # 8. valuables with security

## 2019-05-06 NOTE — ED Notes (Addendum)
Patient asked nurse to asked doctor about ordering his Flomax. Provider notified.

## 2019-05-06 NOTE — ED Notes (Signed)
Daughter of patient Frank Phillips called and stated was waiting for a phone call for an update who is POA. Nurse explained plan of care and would call Behavioral Health to also call daughter with plan of care. Behavioral health notified.

## 2019-05-06 NOTE — ED Notes (Signed)
Behavioral health called patient accepted to [facility] tomorrow 05/07/2019 after 1000.

## 2019-05-06 NOTE — ED Notes (Addendum)
Patient refuses to answer all questions. Unable to ascertain if he is just refusing vs he doesn't know the answer. Patient is very angry. Wife passed away this past 07/28/2023 and daughter states he has been declining (depression and memory) since. Daughter at bedside.

## 2019-05-06 NOTE — ED Notes (Signed)
Daughter, Anderson Malta [phone]

## 2019-05-06 NOTE — ED Notes (Signed)
Pt awoke and states "where am I? How did I get here?" Repeated same questions multiple times. Pt reminded that he was at the hospital, brought in by his daughter - pt states "I don't remember that"

## 2019-05-06 NOTE — ED Notes (Signed)
TTS bedside 

## 2019-05-06 NOTE — ED Notes (Signed)
Per behavioral health, pt. Recommended for Overnight observation

## 2019-05-06 NOTE — ED Notes (Signed)
Spoke with patient who wanted to speak with his daughter. Nurse called daughter and patient is now talking with his daughter on the phone.

## 2019-05-06 NOTE — BH Assessment (Addendum)
TTS team reviewed patient's status and are in agreement that patient requires in-patient gero-psych.  He has significant risk factors for suicide  Including, being an older Male, having recently lost a spouse and has access to weapons.  Patient to be referred out for treatment.  Areatha Keas. [name], MSW, [facility] Disposition Clinical Social Work [phone] (cell) [phone] (office)

## 2019-05-06 NOTE — ED Notes (Signed)
Ordered bfast 

## 2019-05-06 NOTE — Progress Notes (Signed)
Pt accepted to [facility] in for admission on 05/07/19 [name], MD is the accepting/attending provider.  Call report to [place] ED notified.   Pt is IVC  Pt may be transported by [employer] Pt scheduled  to arrive at [place] on 05/07/19 after 10 AM  [name]. [name], MSW, [place] Disposition Clinical Social Work [phone] (cell) [phone] (office)

## 2019-05-06 NOTE — Progress Notes (Signed)
Anette Riedel, NP recommends continued observation for safety and stabilization and to be reassessed in the AM by psych due to the pt's access to lethal methods and hx of depression. Mel Almond, RN and EDP Evette Cristal, Tyna Jaksch, PA-C have been advised.   Lind Covert, MSW, LCSW Therapeutic Triage Specialist  [phone]

## 2019-05-06 NOTE — BH Assessment (Addendum)
Tele Assessment Note   Patient Name: Frank Phillips MRN: [id number] Referring Physician: Desma Phillips Location of Patient: [facility] Location of Provider: [place] is an 73 y.o. male who presents to the ED voluntarily accompanied by his daughter who is his POA. Pt refuses to engage with this Probation officer and turns his back and covers himself with a blanket. Pt's daughter is present and she reports the pt has been making statements such as "I don't want to be here anymore." Pt's daughter states she has never heard the pt say he wants to kill himself or report a plan for suicide, however she does not feel safe with the pt being home. Pt reportedly lives alone and has several guns in his home. Pt has declined in taking care of himself and does not eat properly per reports from his daughter. Pt's reportedly has been sleeping more and experiencing increased depression since his wife passed away in 02-15-18. Pt has been seeing a [facility] at [facility] for the past several months due to depression. Daughter reports she is unaware of any previous suicide attempts or gestures however due to the pt having access to guns in his home, his decline in mental health, and living alone she does not feel the pt is able to keep himself safe.  Frank Riedel, NP recommends continued observation for safety and stabilization and to be reassessed in the AM by psych due to the pt's access to lethal methods and hx of depression. Frank Almond, RN and EDP Frank Phillips, Frank Jaksch, PA-C have been advised.   Diagnosis: MDD, recurrent, severe, w/o psychosis  Past Medical History:  Past Medical History:  Diagnosis Date  . Anxiety   . Aortic atherosclerosis ([place]) 06/06/2018  . Arthritis   . Complication of anesthesia    " i HAD SOME KIND OF PROBLEM ,BUT I DONT REMEMBER WHAT IT WAS "  . Crohn disease ([place])   . Dementia ([place])   . Depression   . Diabetes ([place])   . Fall at home 04/06/2016  . High blood  pressure   . High cholesterol   . Kidney stones   . Orthostatic hypotension 03/2016  . Stroke ([place])   . UTI (urinary tract infection) 05/2018   WITH HEMATURIA    Past Surgical History:  Procedure Laterality Date  . ANGIOPLASTY / STENTING FEMORAL    . APPENDECTOMY    . kidney stones    . TONSILLECTOMY    . TONSILLECTOMY AND ADENOIDECTOMY      Family History:  Family History  Problem Relation Age of Onset  . Cancer Sister        skin    Social History:  reports that he has never smoked. He has never used smokeless tobacco. He reports that he does not drink alcohol or use drugs.  Additional Social History:  Alcohol / Drug Use Pain Medications: See MAR Prescriptions: See MAR Over the Counter: See MAR History of alcohol / drug use?: No history of alcohol / drug abuse  CIWA: CIWA-Ar BP: (!) 163/60 Pulse Rate: (!) 58 COWS:    Allergies:  Allergies  Allergen Reactions  . Levaquin [Levofloxacin]     Out of mind    Home Medications: (Not in a hospital admission)   OB/GYN Status:  No LMP for male patient.  General Assessment Data Location of Assessment: [facility] ED TTS Assessment: In system Is this a Tele or Face-to-Face Assessment?: Tele Assessment Is this an Initial  Assessment or a Re-assessment for this encounter?: Initial Assessment Patient Accompanied by:: Other, Adult(daughter) Permission Given to speak with another: Yes Name, Relationship and Phone Number: Frank Phillips, daughter, POA Language Other than English: No Living Arrangements: Other (Comment) What gender do you identify as?: Male Marital status: Widowed Pregnancy Status: No Living Arrangements: Alone Can pt return to current living arrangement?: Yes Admission Status: Voluntary Is patient capable of signing voluntary admission?: Yes Referral Source: Self/Family/Friend Insurance type: [employer] ADVANTAGE     Crisis Care Plan Living Arrangements: Alone Name of Psychiatrist: Dr. Dewaine Conger,  MD Name of Therapist: none  Education Status Is patient currently in school?: No Is the patient employed, unemployed or receiving disability?: (SSI)  Risk to self with the past 6 months Suicidal Ideation: No-Not Currently/Within Last 6 Months Has patient been a risk to self within the past 6 months prior to admission? : No Suicidal Intent: No Has patient had any suicidal intent within the past 6 months prior to admission? : No Is patient at risk for suicide?: Yes Suicidal Plan?: No Has patient had any suicidal plan within the past 6 months prior to admission? : No Access to Means: Yes Specify Access to Suicidal Means: daughter reports pt has guns in the home What has been your use of drugs/alcohol within the last 12 months?: none reported Previous Attempts/Gestures: No Triggers for Past Attempts: None known Intentional Self Injurious Behavior: None Family Suicide History: Yes(son) Recent stressful life event(s): Loss (Comment), Other (Comment)(wife died in 03-21-2018) Persecutory voices/beliefs?: No Depression: Yes Depression Symptoms: Despondent, Isolating, Fatigue, Loss of interest in usual pleasures, Feeling angry/irritable Substance abuse history and/or treatment for substance abuse?: No Suicide prevention information given to non-admitted patients: Not applicable  Risk to Others within the past 6 months Homicidal Ideation: No Does patient have any lifetime risk of violence toward others beyond the six months prior to admission? : No Thoughts of Harm to Others: No Current Homicidal Intent: No Current Homicidal Plan: No Access to Homicidal Means: No History of harm to others?: No Assessment of Violence: None Noted Does patient have access to weapons?: Yes (Comment)(guns) Criminal Charges Pending?: No Does patient have a court date: No Is patient on probation?: No  Psychosis Hallucinations: None noted Delusions: None noted  Mental Status Report Appearance/Hygiene:  Unremarkable Eye Contact: Poor Motor Activity: Freedom of movement Speech: Aggressive Level of Consciousness: Irritable Mood: Angry Affect: Constricted, Angry Anxiety Level: None Thought Processes: Unable to Assess Judgement: Partial Orientation: Unable to assess Obsessive Compulsive Thoughts/Behaviors: None  Cognitive Functioning Concentration: Unable to Assess Memory: Unable to Assess Is patient IDD: No Insight: Fair Impulse Control: Fair Appetite: Poor Have you had any weight changes? : No Change Sleep: Increased Total Hours of Sleep: 10 Vegetative Symptoms: Staying in bed  ADLScreening [facility] Assessment Services) Patient's cognitive ability adequate to safely complete daily activities?: Yes Patient able to express need for assistance with ADLs?: Yes Independently performs ADLs?: Yes (appropriate for developmental age)  Prior Inpatient Therapy Prior Inpatient Therapy: No  Prior Outpatient Therapy Prior Outpatient Therapy: Yes Prior Therapy Dates: ongoing Prior Therapy Facilty/Provider(s): Dr. Dewaine Conger, MD Reason for Treatment: MDD Does patient have an ACCT team?: No Does patient have Intensive In-House Services?  : No Does patient have [facility] services? : No Does patient have P4CC services?: No  ADL Screening (condition at time of admission) Patient's cognitive ability adequate to safely complete daily activities?: Yes Is the patient deaf or have difficulty hearing?: Yes Does the patient have difficulty seeing, even  when wearing glasses/contacts?: No Does the patient have difficulty concentrating, remembering, or making decisions?: No Patient able to express need for assistance with ADLs?: Yes Does the patient have difficulty dressing or bathing?: No Independently performs ADLs?: Yes (appropriate for developmental age) Does the patient have difficulty walking or climbing stairs?: No Weakness of Legs: None Weakness of Arms/Hands: None  Home Assistive  Devices/Equipment Home Assistive Devices/Equipment: None    Abuse/Neglect Assessment (Assessment to be complete while patient is alone) Abuse/Neglect Assessment Can Be Completed: Yes Physical Abuse: Denies Verbal Abuse: Denies Sexual Abuse: Denies Exploitation of patient/patient's resources: Denies Self-Neglect: Denies     Merchant navy officerAdvance Directives (For Healthcare) Does Patient Have a Medical Advance Directive?: Yes Type of Advance Directive: Healthcare Power of Attorney, Living will Copy of Healthcare Power of Attorney in Chart?: No - copy requested Copy of Living Will in Chart?: No - copy requested          Disposition: Lerry Linerashaun Dixon, NP recommends continued observation for safety and stabilization and to be reassessed in the AM by psych due to the pt's access to lethal methods and hx of depression. Fredric MareBailey, RN and EDP Elson ClanLayden, Early CharsLindsey A, PA-C have been advised.   Disposition Initial Assessment Completed for this Encounter: Yes Disposition of Patient: (overnight OBS pending AM psych assessment) Patient refused recommended treatment: No  This service was provided via telemedicine using a 2-way, interactive audio and video technology.  Names of all persons participating in this telemedicine service and their role in this encounter. Name: Frank FlockGary L Arreola Role: Patient  Name: Era SkeenJennifer Schwartz  Role: POA, daughter  Name: Princess BruinsAquicha Gladstone Rosas Role: TTS       Karolee Ohsquicha R Sha Amer 05/06/2019 3:06 AM

## 2019-05-06 NOTE — ED Notes (Signed)
Spoke with patients daughter, Anderson Malta, for update on patient

## 2019-05-06 NOTE — ED Provider Notes (Addendum)
[facility]   CSN: [id number] Arrival date & time: 05/05/19  2035    History   Chief Complaint Chief Complaint  Patient presents with  . Depression    HPI Frank MainsGary L Borges is a 73 y.o. male past mostly of dementia, depression, diabetes, who presents for evaluation of concern for depression and suicidal ideation.  Patient brought in by daughter who is at bedside providing most of the history.  Reports that patient's wife passed away in September 2018.  Since then, she reports a progressively worsening concern for depression and suicidal ideations.  She states that patient has had depression previously and has been on Cymbalta.  She states that since his wife's death, she feels like his depression has worsened.  He makes comments about "wanting to end it and not be here anymore."  He states he has not made a specific threat about suicide and then will always follow-up with "I do not know if I would actually do it though."  He states that as part of the depression, she has seen a decline in caring for himself and grooming.  She reports that he does not want to eat and does not have much activity throughout the day.  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  He does live by himself.  Daughter denies any homicidal ideation.  No recent fevers, nausea/vomiting.       Daughter: Victorino DikeJennifer [phone]  The history is provided by a relative.    Past Medical History:  Diagnosis Date  . Anxiety   . Aortic atherosclerosis (HCC) 06/06/2018  . Arthritis   . Complication of anesthesia    " i HAD SOME KIND OF PROBLEM ,BUT I DONT REMEMBER WHAT IT WAS "  . Crohn disease (HCC)   . Dementia (HCC)   . Depression   . Diabetes (HCC)   . Fall at home 04/06/2016  . High blood pressure   . High cholesterol   . Kidney stones   . Orthostatic hypotension 03/2016  . Stroke (HCC)   . UTI (urinary tract  infection) 05/2018   WITH HEMATURIA    Patient Active Problem List   Diagnosis Date Noted  . Diabetes mellitus without complication (HCC) 06/06/2018  . UTI (urinary tract infection) 06/06/2018  . Stroke (cerebrum) (HCC) 06/06/2018  . Acute metabolic encephalopathy 06/06/2018  . GERD (gastroesophageal reflux disease) 06/06/2018  . Depression 06/06/2018  . Lung nodule 06/06/2018  . Dehydration 06/06/2018  . FTT (failure to thrive) in adult 06/06/2018  . Aortic atherosclerosis (HCC) 06/06/2018  . Orthostatic hypotension 04/06/2016  . Syncopal episodes 04/06/2016  . Bladder outlet obstruction 11/06/2013  . Dyspnea 09/17/2013  . Cough variant asthma 09/17/2013    Past Surgical History:  Procedure Laterality Date  . ANGIOPLASTY / STENTING FEMORAL    . APPENDECTOMY    . kidney stones    . TONSILLECTOMY    . TONSILLECTOMY AND ADENOIDECTOMY          Home Medications    Prior to Admission medications   Medication Sig Start Date End Date Taking? Authorizing Provider  acetaminophen (TYLENOL) 650 MG CR tablet Take 1,300 mg by mouth every morning.    Yes [provider]  albuterol (PROAIR HFA) 108 (90 BASE) MCG/ACT inhaler Inhale 2 puffs into the lungs every 6 (six) hours as needed for wheezing or shortness of breath.    Yes [provider]  APRISO 0.375 g 24 hr capsule Take 1,500 mg by mouth daily. pain 03/29/16  Yes [provider]  clopidogrel (PLAVIX) 75 MG tablet Take 75 mg by mouth daily with breakfast.   Yes [provider]  DULoxetine (CYMBALTA) 30 MG capsule Take 30 mg by mouth 2 (two) times a day. 02/15/19  Yes [provider]  glipiZIDE (GLUCOTROL) 10 MG tablet Take 10 mg by mouth 2 (two) times daily before a meal.   Yes [provider]  Magnesium Oxide 400 MG CAPS Take 1 capsule (400 mg total) by mouth at bedtime. 06/09/18  Yes Albertine GratesXu, Fang, MD  pantoprazole (PROTONIX) 40 MG tablet Take 40 mg by mouth daily.  11/24/14  Yes  [provider]  tamsulosin (FLOMAX) 0.4 MG CAPS capsule Take 0.4 mg by mouth daily.  12/02/14  Yes [provider]  temazepam (RESTORIL) 30 MG capsule Take 30 mg by mouth at bedtime.  01/23/18  Yes [provider]  beclomethasone (QVAR) 40 MCG/ACT inhaler Take 2 puffs first thing in am and then another 2 puffs about 12 hours later. Patient not taking: Reported on 04/06/2016 12/04/13   Nyoka CowdenWert, Michael B, MD    Family History Family History  Problem Relation Age of Onset  . Cancer Sister        skin    Social History Social History   Tobacco Use  . Smoking status: Never Smoker  . Smokeless tobacco: Never Used  Substance Use Topics  . Alcohol use: No  . Drug use: No     Allergies   Levaquin [levofloxacin]   Review of Systems Review of Systems  Unable to perform ROS: Psychiatric disorder     Physical Exam Updated Vital Signs BP (!) 153/74   Pulse 60   Temp 98.3 F (36.8 C) (Oral)   Resp 18   Ht 5\' 9"  (1.753 m)   Wt 72.7 kg   SpO2 99%   BMI 23.67 kg/m   Physical Exam Vitals signs and nursing note reviewed.  Constitutional:      Appearance: Normal appearance. He is well-developed.     Comments: Sleeping on examination.  HENT:     Head: Normocephalic and atraumatic.  Eyes:     General: Lids are normal.     Conjunctiva/sclera: Conjunctivae normal.     Pupils: Pupils are equal, round, and reactive to light.  Neck:     Musculoskeletal: Full passive range of motion without pain.  Cardiovascular:     Rate and Rhythm: Normal rate and regular rhythm.     Pulses: Normal pulses.     Heart sounds: Normal heart sounds. No murmur. No friction rub. No gallop.   Pulmonary:     Effort: Pulmonary effort is normal.     Breath sounds: Normal breath sounds.     Comments: Lungs clear to auscultation bilaterally.  Symmetric chest rise.  No wheezing, rales, rhonchi. Abdominal:     Palpations: Abdomen is soft. Abdomen is not rigid.     Tenderness: There is  no abdominal tenderness. There is no guarding.  Musculoskeletal: Normal range of motion.  Skin:    General: Skin is warm and dry.     Capillary Refill: Capillary refill takes less than 2 seconds.  Neurological:     Mental Status: He is alert and oriented to person, place, and time.  Psychiatric:        Speech: Speech normal.      ED Treatments / Results  Labs (all labs ordered are listed, but only abnormal results are displayed) Labs Reviewed  COMPREHENSIVE METABOLIC PANEL - Abnormal; Notable for the following components:      Result Value   Glucose, Bld 211 (*)    Total Protein 6.3 (*)    All other components within normal limits  CBC - Abnormal; Notable for the following components:   RBC 4.14 (*)    Hemoglobin 9.9 (*)    HCT 32.8 (*)    MCV 79.2 (*)    MCH 23.9 (*)    All other components within normal limits  RAPID URINE DRUG SCREEN, HOSP PERFORMED - Abnormal; Notable for the following components:   Benzodiazepines POSITIVE (*)    All other components within normal limits  SARS CORONAVIRUS 2 (HOSPITAL ORDER, PERFORMED IN [facility])  ETHANOL    EKG EKG Interpretation  Date/Time:  Sunday May 05 2019 23:17:55 EDT Ventricular Rate:  56 PR Interval:  182 QRS Duration: 122 QT Interval:  444 QTC Calculation: 428 R Axis:   16 Text Interpretation:  Sinus bradycardia Right bundle branch block Abnormal ECG No significant change since last tracing Confirmed by Cardama, Pedro ([id number]) on 05/06/2019 2:18:25 AM   Radiology Dg Chest Port 1 View  Result Date: 05/05/2019 CLINICAL DATA:  Chest pain EXAM: PORTABLE CHEST 1 VIEW COMPARISON:  06/05/2018 FINDINGS: The heart size and mediastinal contours are within normal limits. Aortic atherosclerosis. Mild chronic elevation of right diaphragm. Both lungs are clear. The visualized skeletal structures are unremarkable. IMPRESSION: No active disease. Electronically Signed   By: Kim  Fujinaga M.D.   On: 05/05/2019 22:56     Procedures Procedures (including critical care time)  Medications Ordered in ED Medications  mesalamine (LIALDA) EC tablet 2.4 g (has no administration in time range)  clopidogrel (PLAVIX) tablet 75 mg (has no administration in time range)  DULoxetine (CYMBALTA) DR capsule 30 mg (has no administration in time range)  glipiZIDE (GLUCOTROL) tablet 10 mg (has no administration in time range)  magnesium oxide (MAG-OX) tablet 400 mg (has no administration in time range)  haloperidol lactate (HALDOL) injection 2 mg (has no administration in time range)  LORazepam (ATIVAN) injection 2 mg (has no administration in time range)     Initial Impression / Assessment and Plan / ED Course  I have reviewed the triage vital signs and the nursing notes.  Pertinent labs & imaging results that were available during my care of the patient were reviewed by me and considered in my medical decision making (see chart for details).        72  year old male who presents for evaluation of depression.  Most of history is provided by daughter.  She reports wife died in September 2018.  Since then, patient has had progressively worsened depressive thoughts.  He makes statements such as "I want to end it all."  Daughter concern for worsening depression.  She states that he has been agitated and not taking care of himself and not wanting to eat.  Patient took sleeping pill prior to coming to the ED which is his nightly medications and is asleep on my initial evaluation.  Plan for medical clearance labs, TTS consultation.  CMP shows glucose of 211.  CBC shows no leukocytosis.  Hemoglobin is 9.9.  Review of records show that he has a baseline hemoglobin of around 10.4.  Ethanol level is unremarkable.  Discussed with behavioral health.  They recommend overnight observation with plans to re-eval in the morning.  Patient  placed on psych hold.  He is voluntary at this time.   Called patient's daughter and updated her on  plan.   Patient became very agitated and aggressive and attempted to leave. Given concerns for SI and depression, IVC was put in place.   Portions of this note were generated with Lobbyist. Dictation errors may occur despite best attempts at proofreading.   Final Clinical Impressions(s) / ED Diagnoses   Final diagnoses:  Depression, unspecified depression type    ED Discharge Orders    None       Volanda Napoleon, PA-C 05/06/19 [id number]    Volanda Napoleon, PA-C 05/06/19 0726    Fatima Blank, MD 05/06/19 [phone]

## 2019-05-07 DIAGNOSIS — R419 Unspecified symptoms and signs involving cognitive functions and awareness: Secondary | ICD-10-CM | POA: Diagnosis not present

## 2019-05-07 DIAGNOSIS — K5090 Crohn's disease, unspecified, without complications: Secondary | ICD-10-CM | POA: Diagnosis not present

## 2019-05-07 DIAGNOSIS — F332 Major depressive disorder, recurrent severe without psychotic features: Secondary | ICD-10-CM | POA: Diagnosis not present

## 2019-05-07 DIAGNOSIS — R45851 Suicidal ideations: Secondary | ICD-10-CM | POA: Diagnosis not present

## 2019-05-07 DIAGNOSIS — Z79899 Other long term (current) drug therapy: Secondary | ICD-10-CM | POA: Diagnosis not present

## 2019-05-07 DIAGNOSIS — F329 Major depressive disorder, single episode, unspecified: Secondary | ICD-10-CM | POA: Diagnosis not present

## 2019-05-07 DIAGNOSIS — E119 Type 2 diabetes mellitus without complications: Secondary | ICD-10-CM | POA: Diagnosis not present

## 2019-05-07 DIAGNOSIS — R030 Elevated blood-pressure reading, without diagnosis of hypertension: Secondary | ICD-10-CM | POA: Diagnosis not present

## 2019-05-07 MED ORDER — ACETAMINOPHEN 325 MG PO TABS
650.0000 mg | ORAL_TABLET | Freq: Four times a day (QID) | ORAL | Status: DC | PRN
  Administered 2019-05-07: 650 mg via ORAL
  Filled 2019-05-07 (×2): qty 2

## 2019-05-07 NOTE — Progress Notes (Addendum)
CSW made aware that patient's daughter is unhappy about sending patient to [facility].  Although patient is IVC'd CSW called all of the hospitals with gero-psych units in the state with the following results:  [facility]-No Beds and Pt declined due to medical issues (Aortic atherosclerosis and dementia) [facility] [place] not accepting patients outside of their system. [facility]-No Beds [facility]-1 d/c planned.  Patient IVC and updated vitals, labs and [facility]-Left message for their access coordinator, but they did not have beds yesterday. [facility]-Gero Unit closed last year [facility]-No beds [facility]-No beds in [place] psych unit. [facility]-Gero unit closed June 2020 [facility] unit closed June 2020 [facility]-No beds [facility]-Not able to admit due to dementia diagnosis  CSW contacted patient's daughter, Vivi Martens ([phone]) and reviewed bed status with her.  Daughter asked as to whether she could take patient to her home and "wait until a bed becomes available.  CSW explained that placement might be possible as a walk-in to hospitals with a gero unit but that there is no guarantee that he would get a bed at that particular hospital and that it is more likely patient would need to be referred through an ED or possibly directly from patient's Psychiatrist, PCP, or Neurologist.  Assured daughter that I would contact her when I heard back from [facility] and/or [facility].  Patient is getting outpatient Psychiatric treatment through [facility].  Areatha Keas. [name], MSW, [facility] Clinical Social Work [phone] (cell) [phone] (office)

## 2019-05-07 NOTE — ED Provider Notes (Signed)
73 year old male in the emergency room today for depression and suicidal ideation.  Patient became agitated yesterday and was sedated chemically.  Patient currently patient is awake, withdrawn, lying on his bed staring at the ceiling.  Patient reports joint pain with history of arthritis, denies any other needs, does not want anything for his pain at this time.  Plan is for patient to be admitted to [facility].   Tacy Learn, PA-C 05/07/19 1600    Gareth Morgan, MD 05/10/19 [phone]

## 2019-05-07 NOTE — ED Triage Notes (Signed)
sherriff contacted for transport to [facility]

## 2019-05-07 NOTE — ED Notes (Signed)
2 sheriff officers arrived to transport patient.

## 2019-05-07 NOTE — ED Notes (Signed)
Given report to [facility].

## 2019-05-07 NOTE — Progress Notes (Signed)
Pt accepted to [facility] Brantley Fling, MD is the accepting/attending provider.  Call report to [phone] [facility] ED notified.   Pt is IVC  Pt may be transported by [facility] Pt scheduled  to arrive at [facility] as soon as transport can be arranged.  PLEASE WAIT TO CALL REPORT UNTIL TRANSPORT IS THERE.  Daughter, Vivi Martens, [place], notified of transfer placement and is in agreement.

## 2019-05-07 NOTE — ED Notes (Addendum)
Pt's daughter is his Power of attorney and is forbidding her father to got to [facility]. She is wanting him to actually come home and she will take care of  Him. Please give any information to her. She is very concerned and very upset. Anderson Malta [phone]

## 2019-05-07 NOTE — ED Notes (Signed)
Breakfast tray ordered 

## 2019-06-27 DIAGNOSIS — E119 Type 2 diabetes mellitus without complications: Secondary | ICD-10-CM | POA: Diagnosis not present

## 2019-06-27 IMAGING — CT CT HEAD W/O CM
4 series · 16 of 47 positions shown, 18 images · non-contrast
Comparison: None.

CLINICAL DATA: 3-4 days of increased weakness, decreased appetite
and urinary incontinence with

EXAM:
CT HEAD WITHOUT CONTRAST
TECHNIQUE: Contiguous axial images were obtained from the base of the skull
through the vertex without intravenous contrast.

[Series 3: head without · axial · non-contrast · 0.41mm/px · z∈[-66,+54]mm · 7 of 34 slices shown, 9 images]
[im 5/34  brain]
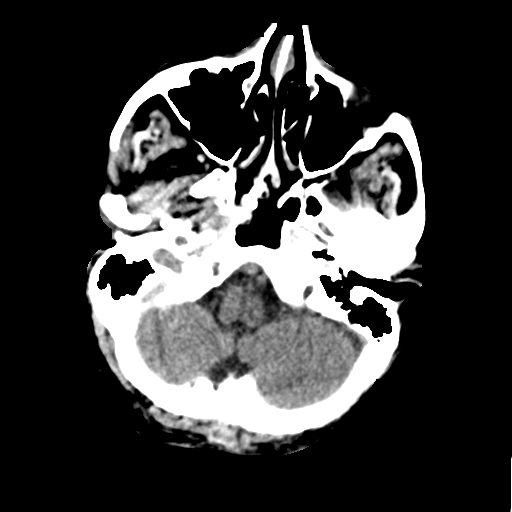
[im 5/34  bone]
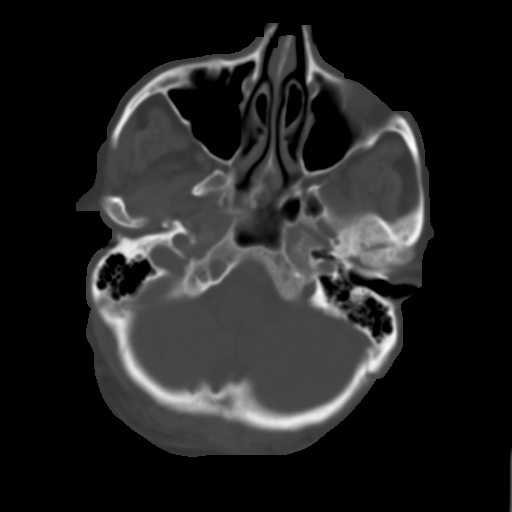
[im 9/34  brain]
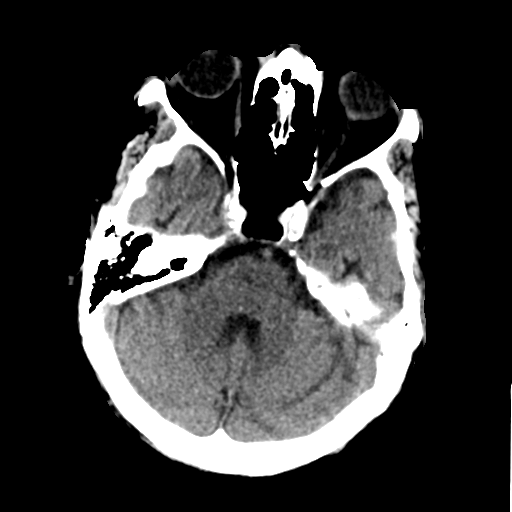
[im 13/34  brain]
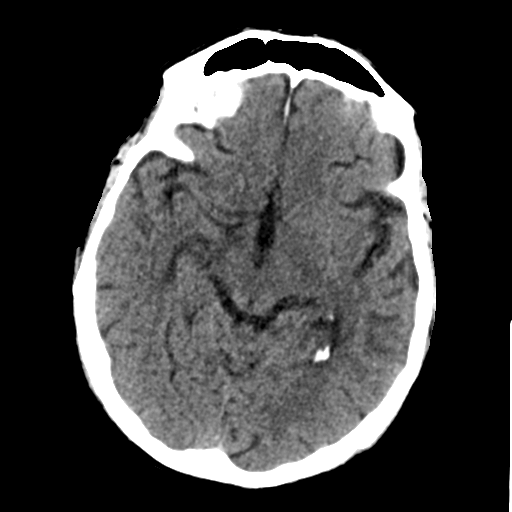
[im 17/34  brain]
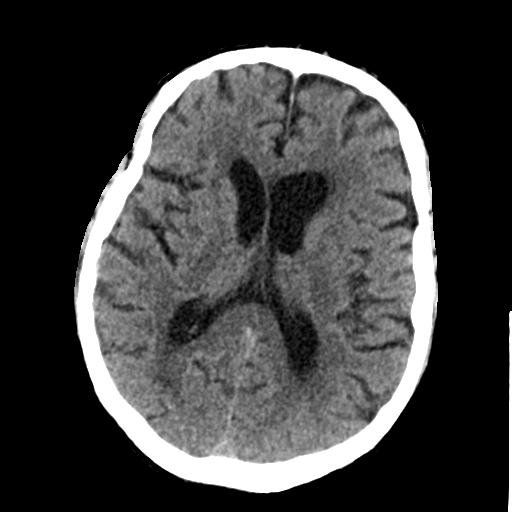
[im 21/34  brain]
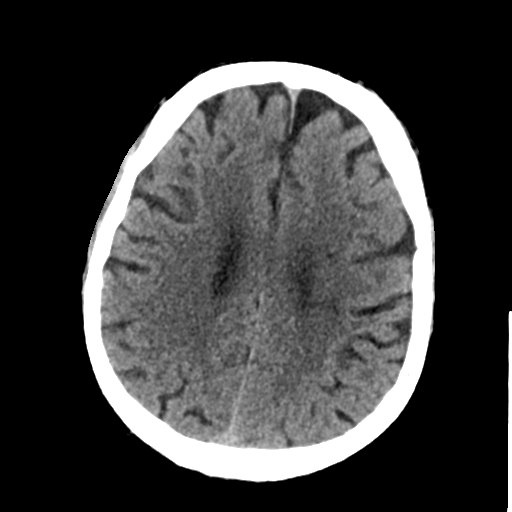
[im 21/34  bone]
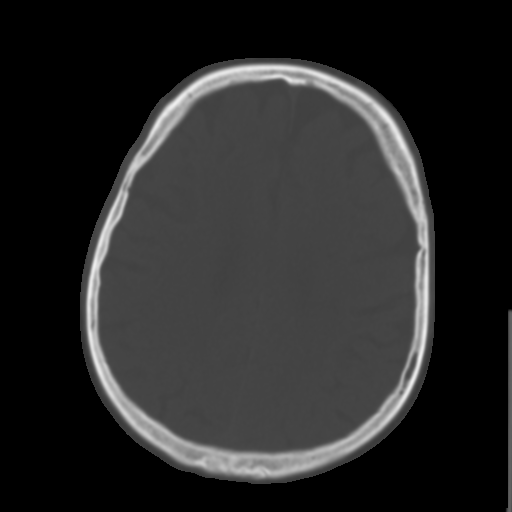
[im 25/34  brain]
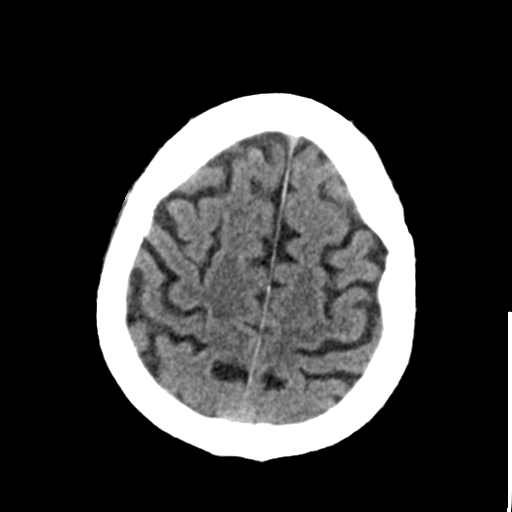
[im 29/34  brain]
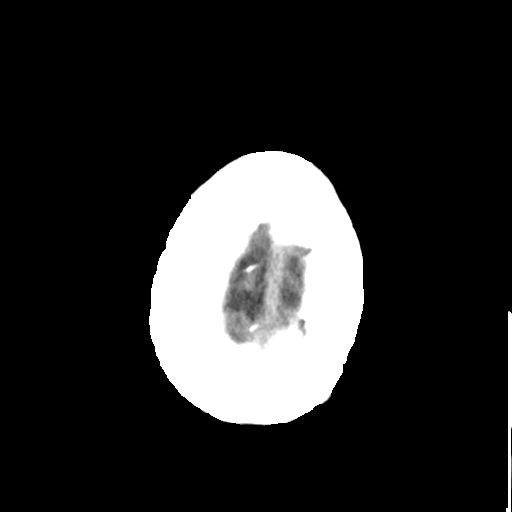

[Series 4: head bone · axial · 0.41mm/px · z∈[-70,-38]mm · 3 of 83 slices shown]
[im 9/83  bone]
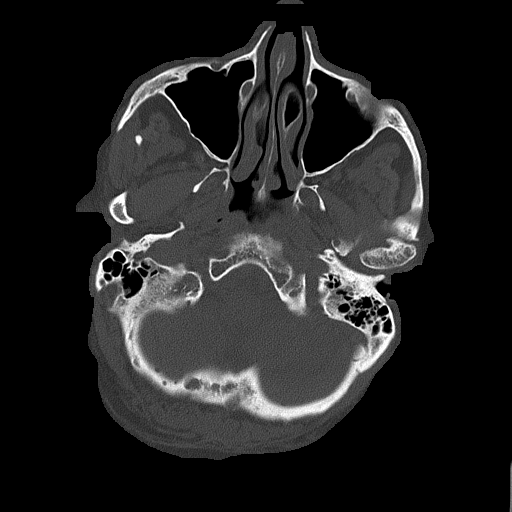
[im 17/83  bone]
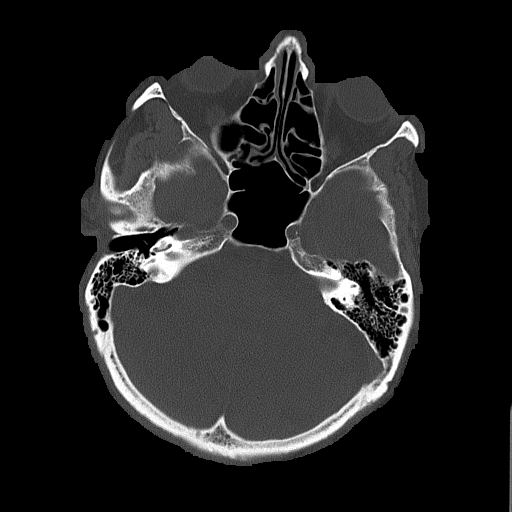
[im 25/83  bone]
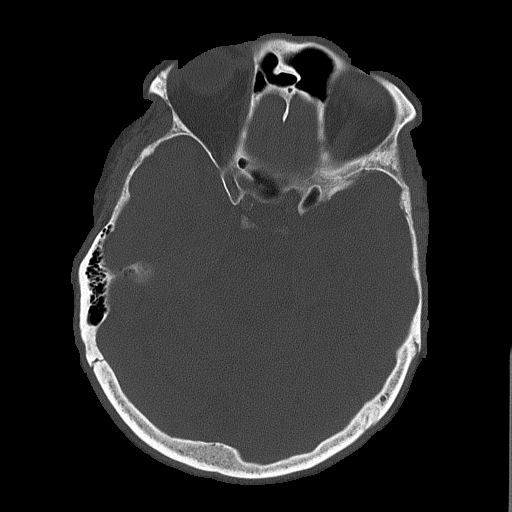

[Series 5: head without cor · coronal · non-contrast · 0.32mm/px · 3 of 67 slices shown]
[im 23/67  brain]
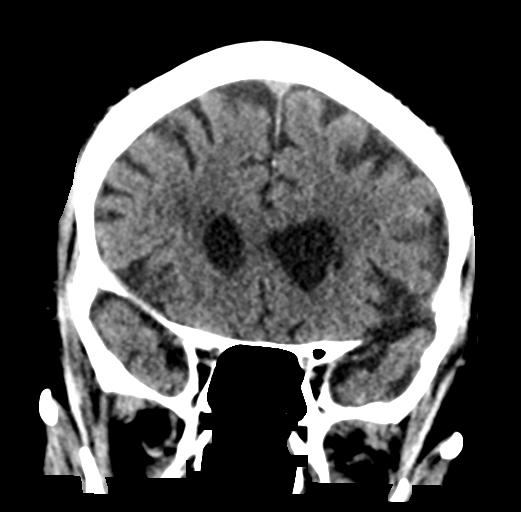
[im 30/67  brain]
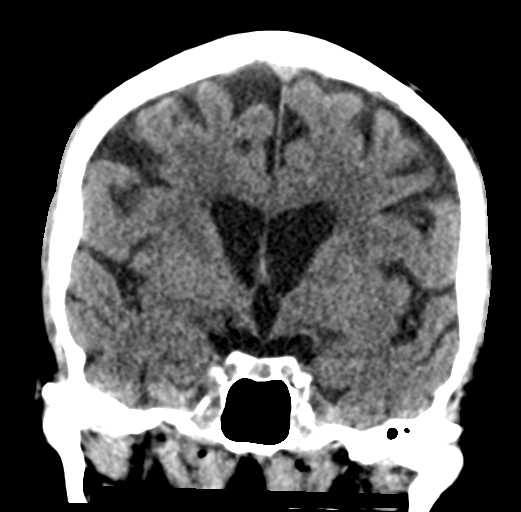
[im 37/67  brain]
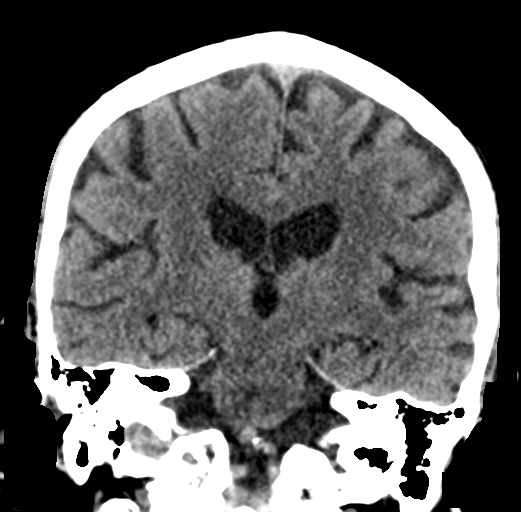

[Series 6: head without sag · sagittal · non-contrast · 0.31mm/px · 3 of 53 slices shown]
[im 18/53  brain]
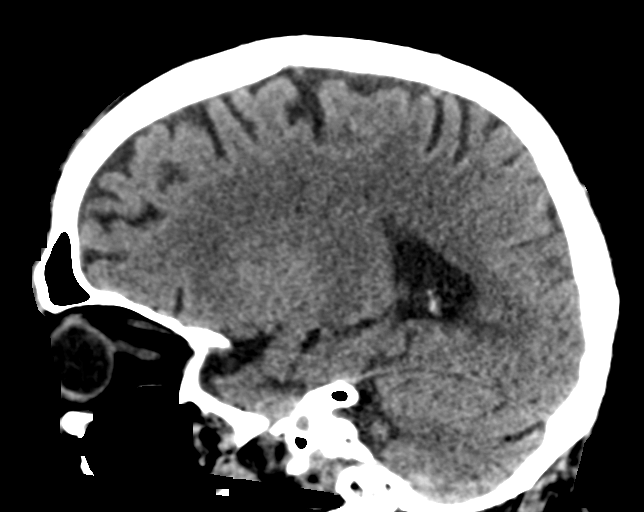
[im 27/53  brain]
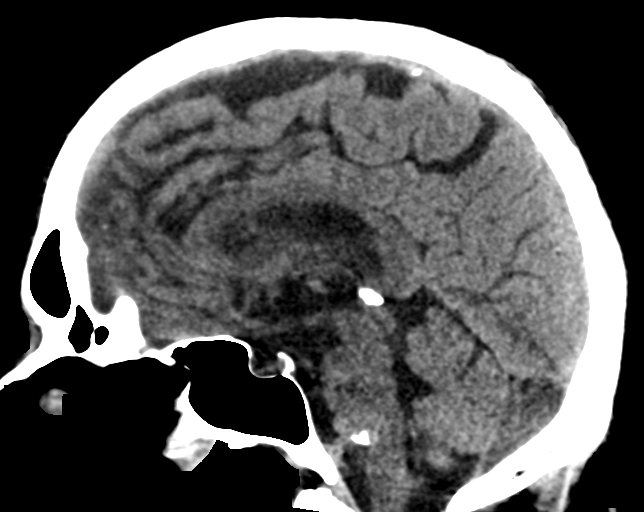
[im 35/53  brain]
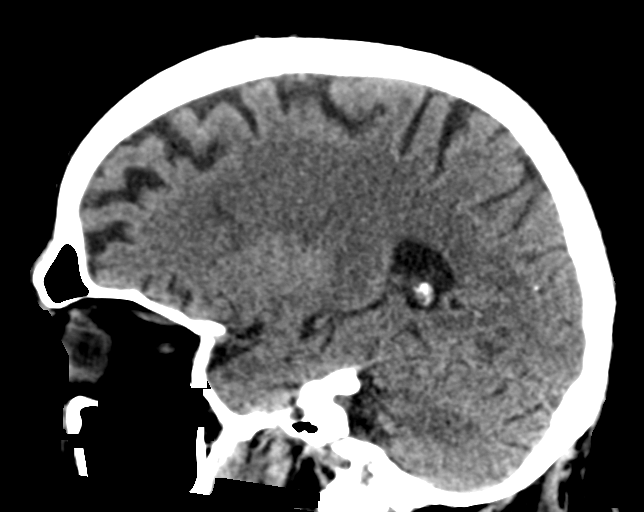

[16 of 47 positions shown; findings below may reference images not displayed]

FINDINGS: Brain: Stable superficial and central atrophy. No acute intracranial
hemorrhage, midline shift, mass effect or edema. No large vascular
territory infarct. No hydrocephalus. Chronic right-sided lacunar
infarcts near the genu of the right internal capsule with similar
stable lacunar infarcts involving right external capsule. Chronic
periventricular white matter hypodensities consistent with small
vessel ischemia.

Vascular: Dolichoectasia and atherosclerotic calcifications at the
vertebrobasilar junction as before with calcified plaque, unchanged
in appearance. No hyperdense vessel sign.

Skull: Intact

Sinuses/Orbits: Partially included soft tissue density along the
lateral wall of the right maxillary sinus likely representing small
mucous retention cyst. Mild right maxillary sinus wall thickening
likely reflective of chronic sinusitis. Otherwise, paranasal sinuses
appear clear. The orbits and globes are symmetric in appearance.

Other: Mastoids are clear.
IMPRESSION: Chronic white matter small vessel ischemic disease without acute
intracranial abnormality.

## 2019-08-08 DIAGNOSIS — H1013 Acute atopic conjunctivitis, bilateral: Secondary | ICD-10-CM | POA: Diagnosis not present

## 2019-08-08 DIAGNOSIS — H02889 Meibomian gland dysfunction of unspecified eye, unspecified eyelid: Secondary | ICD-10-CM | POA: Diagnosis not present

## 2019-08-28 ENCOUNTER — Other Ambulatory Visit: Payer: Self-pay

## 2019-08-28 DIAGNOSIS — Z20822 Contact with and (suspected) exposure to covid-19: Secondary | ICD-10-CM

## 2019-08-30 LAB — NOVEL CORONAVIRUS, NAA: SARS-CoV-2, NAA: NOT DETECTED

## 2019-09-24 DIAGNOSIS — F3341 Major depressive disorder, recurrent, in partial remission: Secondary | ICD-10-CM | POA: Diagnosis not present

## 2019-09-24 DIAGNOSIS — I10 Essential (primary) hypertension: Secondary | ICD-10-CM | POA: Diagnosis not present

## 2019-09-24 DIAGNOSIS — E1165 Type 2 diabetes mellitus with hyperglycemia: Secondary | ICD-10-CM | POA: Diagnosis not present

## 2019-09-24 DIAGNOSIS — F5104 Psychophysiologic insomnia: Secondary | ICD-10-CM | POA: Diagnosis not present

## 2019-09-24 DIAGNOSIS — M450 Ankylosing spondylitis of multiple sites in spine: Secondary | ICD-10-CM | POA: Diagnosis not present

## 2019-09-24 DIAGNOSIS — Z23 Encounter for immunization: Secondary | ICD-10-CM | POA: Diagnosis not present

## 2019-09-24 DIAGNOSIS — F0390 Unspecified dementia without behavioral disturbance: Secondary | ICD-10-CM | POA: Diagnosis not present

## 2019-09-24 DIAGNOSIS — N138 Other obstructive and reflux uropathy: Secondary | ICD-10-CM | POA: Diagnosis not present

## 2019-09-24 DIAGNOSIS — K219 Gastro-esophageal reflux disease without esophagitis: Secondary | ICD-10-CM | POA: Diagnosis not present

## 2019-09-24 DIAGNOSIS — N401 Enlarged prostate with lower urinary tract symptoms: Secondary | ICD-10-CM | POA: Diagnosis not present

## 2019-09-24 DIAGNOSIS — E782 Mixed hyperlipidemia: Secondary | ICD-10-CM | POA: Diagnosis not present

## 2019-10-10 DIAGNOSIS — N401 Enlarged prostate with lower urinary tract symptoms: Secondary | ICD-10-CM | POA: Diagnosis not present

## 2019-10-10 DIAGNOSIS — R350 Frequency of micturition: Secondary | ICD-10-CM | POA: Diagnosis not present

## 2019-10-10 DIAGNOSIS — N3941 Urge incontinence: Secondary | ICD-10-CM | POA: Diagnosis not present

## 2019-10-10 DIAGNOSIS — Z87442 Personal history of urinary calculi: Secondary | ICD-10-CM | POA: Diagnosis not present

## 2019-10-10 DIAGNOSIS — Z87440 Personal history of urinary (tract) infections: Secondary | ICD-10-CM | POA: Diagnosis not present

## 2019-10-21 DIAGNOSIS — E1165 Type 2 diabetes mellitus with hyperglycemia: Secondary | ICD-10-CM | POA: Diagnosis not present

## 2020-01-15 DIAGNOSIS — G4709 Other insomnia: Secondary | ICD-10-CM | POA: Diagnosis not present

## 2020-01-15 DIAGNOSIS — F332 Major depressive disorder, recurrent severe without psychotic features: Secondary | ICD-10-CM | POA: Diagnosis not present

## 2020-02-13 DIAGNOSIS — G4709 Other insomnia: Secondary | ICD-10-CM | POA: Diagnosis not present

## 2020-02-13 DIAGNOSIS — Z7409 Other reduced mobility: Secondary | ICD-10-CM | POA: Diagnosis not present

## 2020-02-13 DIAGNOSIS — F0390 Unspecified dementia without behavioral disturbance: Secondary | ICD-10-CM | POA: Diagnosis not present

## 2020-02-13 DIAGNOSIS — Z789 Other specified health status: Secondary | ICD-10-CM | POA: Diagnosis not present

## 2020-03-06 DIAGNOSIS — M129 Arthropathy, unspecified: Secondary | ICD-10-CM | POA: Diagnosis not present

## 2020-03-06 DIAGNOSIS — N138 Other obstructive and reflux uropathy: Secondary | ICD-10-CM | POA: Diagnosis not present

## 2020-03-06 DIAGNOSIS — F5104 Psychophysiologic insomnia: Secondary | ICD-10-CM | POA: Diagnosis not present

## 2020-03-06 DIAGNOSIS — N401 Enlarged prostate with lower urinary tract symptoms: Secondary | ICD-10-CM | POA: Diagnosis not present

## 2020-03-06 DIAGNOSIS — G3184 Mild cognitive impairment, so stated: Secondary | ICD-10-CM | POA: Diagnosis not present

## 2020-03-06 DIAGNOSIS — R002 Palpitations: Secondary | ICD-10-CM | POA: Diagnosis not present

## 2020-03-06 DIAGNOSIS — E1165 Type 2 diabetes mellitus with hyperglycemia: Secondary | ICD-10-CM | POA: Diagnosis not present

## 2020-03-27 DIAGNOSIS — Z111 Encounter for screening for respiratory tuberculosis: Secondary | ICD-10-CM | POA: Diagnosis not present

## 2020-04-01 DIAGNOSIS — M450 Ankylosing spondylitis of multiple sites in spine: Secondary | ICD-10-CM | POA: Diagnosis not present

## 2020-04-01 DIAGNOSIS — K219 Gastro-esophageal reflux disease without esophagitis: Secondary | ICD-10-CM | POA: Diagnosis not present

## 2020-04-01 DIAGNOSIS — N401 Enlarged prostate with lower urinary tract symptoms: Secondary | ICD-10-CM | POA: Diagnosis not present

## 2020-04-01 DIAGNOSIS — E782 Mixed hyperlipidemia: Secondary | ICD-10-CM | POA: Diagnosis not present

## 2020-04-01 DIAGNOSIS — H9193 Unspecified hearing loss, bilateral: Secondary | ICD-10-CM | POA: Diagnosis not present

## 2020-04-01 DIAGNOSIS — I10 Essential (primary) hypertension: Secondary | ICD-10-CM | POA: Diagnosis not present

## 2020-04-01 DIAGNOSIS — N3941 Urge incontinence: Secondary | ICD-10-CM | POA: Diagnosis not present

## 2020-04-01 DIAGNOSIS — K5090 Crohn's disease, unspecified, without complications: Secondary | ICD-10-CM | POA: Diagnosis not present

## 2020-04-01 DIAGNOSIS — E1165 Type 2 diabetes mellitus with hyperglycemia: Secondary | ICD-10-CM | POA: Diagnosis not present

## 2020-04-02 DIAGNOSIS — H9193 Unspecified hearing loss, bilateral: Secondary | ICD-10-CM | POA: Insufficient documentation

## 2020-04-16 DIAGNOSIS — R350 Frequency of micturition: Secondary | ICD-10-CM | POA: Diagnosis not present

## 2020-04-16 DIAGNOSIS — N3941 Urge incontinence: Secondary | ICD-10-CM | POA: Diagnosis not present

## 2020-04-20 DIAGNOSIS — R002 Palpitations: Secondary | ICD-10-CM | POA: Diagnosis not present

## 2020-04-22 DIAGNOSIS — R3912 Poor urinary stream: Secondary | ICD-10-CM | POA: Diagnosis not present

## 2020-04-22 DIAGNOSIS — N311 Reflex neuropathic bladder, not elsewhere classified: Secondary | ICD-10-CM | POA: Diagnosis not present

## 2020-04-22 DIAGNOSIS — N401 Enlarged prostate with lower urinary tract symptoms: Secondary | ICD-10-CM | POA: Diagnosis not present

## 2020-04-22 DIAGNOSIS — N3941 Urge incontinence: Secondary | ICD-10-CM | POA: Diagnosis not present

## 2020-04-24 DIAGNOSIS — I471 Supraventricular tachycardia: Secondary | ICD-10-CM | POA: Diagnosis not present

## 2020-04-24 DIAGNOSIS — I472 Ventricular tachycardia: Secondary | ICD-10-CM | POA: Diagnosis not present

## 2020-04-27 DIAGNOSIS — H903 Sensorineural hearing loss, bilateral: Secondary | ICD-10-CM | POA: Diagnosis not present

## 2020-04-27 DIAGNOSIS — H9313 Tinnitus, bilateral: Secondary | ICD-10-CM | POA: Insufficient documentation

## 2020-04-29 DIAGNOSIS — I4729 Other ventricular tachycardia: Secondary | ICD-10-CM | POA: Insufficient documentation

## 2020-04-29 DIAGNOSIS — I471 Supraventricular tachycardia, unspecified: Secondary | ICD-10-CM | POA: Insufficient documentation

## 2020-05-08 DIAGNOSIS — I471 Supraventricular tachycardia: Secondary | ICD-10-CM | POA: Diagnosis not present

## 2020-05-08 DIAGNOSIS — I472 Ventricular tachycardia: Secondary | ICD-10-CM | POA: Diagnosis not present

## 2020-05-08 DIAGNOSIS — I491 Atrial premature depolarization: Secondary | ICD-10-CM | POA: Diagnosis not present

## 2020-05-08 DIAGNOSIS — I951 Orthostatic hypotension: Secondary | ICD-10-CM | POA: Diagnosis not present

## 2020-05-08 DIAGNOSIS — I10 Essential (primary) hypertension: Secondary | ICD-10-CM | POA: Diagnosis not present

## 2020-05-08 DIAGNOSIS — I4510 Unspecified right bundle-branch block: Secondary | ICD-10-CM | POA: Diagnosis not present

## 2020-05-08 DIAGNOSIS — R002 Palpitations: Secondary | ICD-10-CM | POA: Diagnosis not present

## 2020-05-28 DIAGNOSIS — E119 Type 2 diabetes mellitus without complications: Secondary | ICD-10-CM | POA: Diagnosis not present

## 2020-05-28 DIAGNOSIS — I110 Hypertensive heart disease with heart failure: Secondary | ICD-10-CM | POA: Diagnosis not present

## 2020-05-28 DIAGNOSIS — F0390 Unspecified dementia without behavioral disturbance: Secondary | ICD-10-CM | POA: Diagnosis not present

## 2020-05-28 DIAGNOSIS — I4430 Unspecified atrioventricular block: Secondary | ICD-10-CM | POA: Diagnosis not present

## 2020-05-28 DIAGNOSIS — E7800 Pure hypercholesterolemia, unspecified: Secondary | ICD-10-CM | POA: Diagnosis not present

## 2020-05-28 DIAGNOSIS — I252 Old myocardial infarction: Secondary | ICD-10-CM | POA: Diagnosis not present

## 2020-05-28 DIAGNOSIS — Z8673 Personal history of transient ischemic attack (TIA), and cerebral infarction without residual deficits: Secondary | ICD-10-CM | POA: Diagnosis not present

## 2020-05-28 DIAGNOSIS — R0989 Other specified symptoms and signs involving the circulatory and respiratory systems: Secondary | ICD-10-CM | POA: Diagnosis not present

## 2020-05-28 DIAGNOSIS — R002 Palpitations: Secondary | ICD-10-CM | POA: Diagnosis not present

## 2020-05-28 DIAGNOSIS — Z7984 Long term (current) use of oral hypoglycemic drugs: Secondary | ICD-10-CM | POA: Diagnosis not present

## 2020-05-28 DIAGNOSIS — I2510 Atherosclerotic heart disease of native coronary artery without angina pectoris: Secondary | ICD-10-CM | POA: Diagnosis not present

## 2020-05-28 DIAGNOSIS — Z955 Presence of coronary angioplasty implant and graft: Secondary | ICD-10-CM | POA: Diagnosis not present

## 2020-05-28 DIAGNOSIS — I10 Essential (primary) hypertension: Secondary | ICD-10-CM | POA: Diagnosis not present

## 2020-05-28 DIAGNOSIS — R0902 Hypoxemia: Secondary | ICD-10-CM | POA: Diagnosis not present

## 2020-05-28 DIAGNOSIS — I441 Atrioventricular block, second degree: Secondary | ICD-10-CM | POA: Diagnosis not present

## 2020-05-28 DIAGNOSIS — Z79899 Other long term (current) drug therapy: Secondary | ICD-10-CM | POA: Diagnosis not present

## 2020-05-28 DIAGNOSIS — I509 Heart failure, unspecified: Secondary | ICD-10-CM | POA: Diagnosis not present

## 2020-05-28 DIAGNOSIS — R001 Bradycardia, unspecified: Secondary | ICD-10-CM | POA: Diagnosis not present

## 2020-06-02 DIAGNOSIS — R002 Palpitations: Secondary | ICD-10-CM | POA: Diagnosis not present

## 2020-06-02 DIAGNOSIS — I471 Supraventricular tachycardia: Secondary | ICD-10-CM | POA: Diagnosis not present

## 2020-06-02 DIAGNOSIS — I472 Ventricular tachycardia: Secondary | ICD-10-CM | POA: Diagnosis not present

## 2020-06-02 DIAGNOSIS — I4510 Unspecified right bundle-branch block: Secondary | ICD-10-CM | POA: Diagnosis not present

## 2020-06-02 DIAGNOSIS — I10 Essential (primary) hypertension: Secondary | ICD-10-CM | POA: Diagnosis not present

## 2020-06-08 DIAGNOSIS — N401 Enlarged prostate with lower urinary tract symptoms: Secondary | ICD-10-CM | POA: Diagnosis not present

## 2020-06-08 DIAGNOSIS — N311 Reflex neuropathic bladder, not elsewhere classified: Secondary | ICD-10-CM | POA: Diagnosis not present

## 2020-06-08 DIAGNOSIS — R3912 Poor urinary stream: Secondary | ICD-10-CM | POA: Diagnosis not present

## 2020-09-17 DIAGNOSIS — H903 Sensorineural hearing loss, bilateral: Secondary | ICD-10-CM | POA: Diagnosis not present

## 2020-10-02 DIAGNOSIS — I10 Essential (primary) hypertension: Secondary | ICD-10-CM | POA: Diagnosis not present

## 2020-10-02 DIAGNOSIS — Z0000 Encounter for general adult medical examination without abnormal findings: Secondary | ICD-10-CM | POA: Diagnosis not present

## 2020-10-02 DIAGNOSIS — H10532 Contact blepharoconjunctivitis, left eye: Secondary | ICD-10-CM | POA: Diagnosis not present

## 2020-10-02 DIAGNOSIS — E1165 Type 2 diabetes mellitus with hyperglycemia: Secondary | ICD-10-CM | POA: Diagnosis not present

## 2020-10-02 DIAGNOSIS — E782 Mixed hyperlipidemia: Secondary | ICD-10-CM | POA: Diagnosis not present

## 2020-10-06 DIAGNOSIS — K5080 Crohn's disease of both small and large intestine without complications: Secondary | ICD-10-CM | POA: Diagnosis not present

## 2020-10-21 DIAGNOSIS — H6123 Impacted cerumen, bilateral: Secondary | ICD-10-CM | POA: Diagnosis not present

## 2020-12-03 DIAGNOSIS — K5080 Crohn's disease of both small and large intestine without complications: Secondary | ICD-10-CM | POA: Diagnosis not present

## 2020-12-03 DIAGNOSIS — K219 Gastro-esophageal reflux disease without esophagitis: Secondary | ICD-10-CM | POA: Diagnosis not present

## 2020-12-03 DIAGNOSIS — Z8711 Personal history of peptic ulcer disease: Secondary | ICD-10-CM | POA: Diagnosis not present

## 2020-12-23 DIAGNOSIS — J0100 Acute maxillary sinusitis, unspecified: Secondary | ICD-10-CM | POA: Diagnosis not present

## 2021-01-29 DIAGNOSIS — M542 Cervicalgia: Secondary | ICD-10-CM | POA: Diagnosis not present

## 2021-01-29 DIAGNOSIS — R079 Chest pain, unspecified: Secondary | ICD-10-CM | POA: Diagnosis not present

## 2021-01-29 DIAGNOSIS — Z888 Allergy status to other drugs, medicaments and biological substances status: Secondary | ICD-10-CM | POA: Diagnosis not present

## 2021-01-29 DIAGNOSIS — I214 Non-ST elevation (NSTEMI) myocardial infarction: Secondary | ICD-10-CM | POA: Diagnosis not present

## 2021-01-29 DIAGNOSIS — I161 Hypertensive emergency: Secondary | ICD-10-CM | POA: Diagnosis not present

## 2021-01-29 DIAGNOSIS — I2542 Coronary artery dissection: Secondary | ICD-10-CM | POA: Diagnosis not present

## 2021-01-29 DIAGNOSIS — I361 Nonrheumatic tricuspid (valve) insufficiency: Secondary | ICD-10-CM | POA: Diagnosis not present

## 2021-01-29 DIAGNOSIS — I10 Essential (primary) hypertension: Secondary | ICD-10-CM | POA: Diagnosis not present

## 2021-01-29 DIAGNOSIS — K219 Gastro-esophageal reflux disease without esophagitis: Secondary | ICD-10-CM | POA: Diagnosis not present

## 2021-01-29 DIAGNOSIS — M1990 Unspecified osteoarthritis, unspecified site: Secondary | ICD-10-CM | POA: Diagnosis not present

## 2021-01-29 DIAGNOSIS — Z8249 Family history of ischemic heart disease and other diseases of the circulatory system: Secondary | ICD-10-CM | POA: Diagnosis not present

## 2021-01-29 DIAGNOSIS — E7800 Pure hypercholesterolemia, unspecified: Secondary | ICD-10-CM | POA: Diagnosis not present

## 2021-01-29 DIAGNOSIS — I2582 Chronic total occlusion of coronary artery: Secondary | ICD-10-CM | POA: Diagnosis not present

## 2021-01-29 DIAGNOSIS — Z79899 Other long term (current) drug therapy: Secondary | ICD-10-CM | POA: Diagnosis not present

## 2021-01-29 DIAGNOSIS — E119 Type 2 diabetes mellitus without complications: Secondary | ICD-10-CM | POA: Diagnosis not present

## 2021-01-29 DIAGNOSIS — K5090 Crohn's disease, unspecified, without complications: Secondary | ICD-10-CM | POA: Diagnosis not present

## 2021-01-29 DIAGNOSIS — I4510 Unspecified right bundle-branch block: Secondary | ICD-10-CM | POA: Diagnosis not present

## 2021-01-29 DIAGNOSIS — Z7902 Long term (current) use of antithrombotics/antiplatelets: Secondary | ICD-10-CM | POA: Diagnosis not present

## 2021-01-29 DIAGNOSIS — Z7984 Long term (current) use of oral hypoglycemic drugs: Secondary | ICD-10-CM | POA: Diagnosis not present

## 2021-01-29 DIAGNOSIS — F32A Depression, unspecified: Secondary | ICD-10-CM | POA: Diagnosis not present

## 2021-01-29 DIAGNOSIS — Y831 Surgical operation with implant of artificial internal device as the cause of abnormal reaction of the patient, or of later complication, without mention of misadventure at the time of the procedure: Secondary | ICD-10-CM | POA: Diagnosis not present

## 2021-01-29 DIAGNOSIS — T82855A Stenosis of coronary artery stent, initial encounter: Secondary | ICD-10-CM | POA: Diagnosis not present

## 2021-01-29 DIAGNOSIS — G4733 Obstructive sleep apnea (adult) (pediatric): Secondary | ICD-10-CM | POA: Diagnosis not present

## 2021-01-29 DIAGNOSIS — Z955 Presence of coronary angioplasty implant and graft: Secondary | ICD-10-CM | POA: Diagnosis not present

## 2021-01-29 DIAGNOSIS — I509 Heart failure, unspecified: Secondary | ICD-10-CM | POA: Diagnosis not present

## 2021-01-29 DIAGNOSIS — F419 Anxiety disorder, unspecified: Secondary | ICD-10-CM | POA: Diagnosis not present

## 2021-01-29 DIAGNOSIS — I252 Old myocardial infarction: Secondary | ICD-10-CM | POA: Diagnosis not present

## 2021-01-29 DIAGNOSIS — I25110 Atherosclerotic heart disease of native coronary artery with unstable angina pectoris: Secondary | ICD-10-CM | POA: Diagnosis not present

## 2021-01-29 DIAGNOSIS — I700 Atherosclerosis of aorta: Secondary | ICD-10-CM | POA: Diagnosis not present

## 2021-01-29 DIAGNOSIS — R519 Headache, unspecified: Secondary | ICD-10-CM | POA: Diagnosis not present

## 2021-01-29 DIAGNOSIS — F0390 Unspecified dementia without behavioral disturbance: Secondary | ICD-10-CM | POA: Diagnosis not present

## 2021-01-29 DIAGNOSIS — I249 Acute ischemic heart disease, unspecified: Secondary | ICD-10-CM | POA: Diagnosis not present

## 2021-01-29 DIAGNOSIS — I2510 Atherosclerotic heart disease of native coronary artery without angina pectoris: Secondary | ICD-10-CM | POA: Diagnosis not present

## 2021-01-29 DIAGNOSIS — E785 Hyperlipidemia, unspecified: Secondary | ICD-10-CM | POA: Diagnosis not present

## 2021-01-29 DIAGNOSIS — Z20822 Contact with and (suspected) exposure to covid-19: Secondary | ICD-10-CM | POA: Diagnosis not present

## 2021-01-29 DIAGNOSIS — E11621 Type 2 diabetes mellitus with foot ulcer: Secondary | ICD-10-CM | POA: Diagnosis not present

## 2021-01-29 DIAGNOSIS — Z8673 Personal history of transient ischemic attack (TIA), and cerebral infarction without residual deficits: Secondary | ICD-10-CM | POA: Diagnosis not present

## 2021-01-30 DIAGNOSIS — E119 Type 2 diabetes mellitus without complications: Secondary | ICD-10-CM | POA: Diagnosis not present

## 2021-01-30 DIAGNOSIS — I161 Hypertensive emergency: Secondary | ICD-10-CM | POA: Diagnosis not present

## 2021-01-30 DIAGNOSIS — I249 Acute ischemic heart disease, unspecified: Secondary | ICD-10-CM | POA: Diagnosis not present

## 2021-01-30 DIAGNOSIS — E785 Hyperlipidemia, unspecified: Secondary | ICD-10-CM | POA: Diagnosis not present

## 2021-01-31 DIAGNOSIS — I161 Hypertensive emergency: Secondary | ICD-10-CM | POA: Diagnosis not present

## 2021-01-31 DIAGNOSIS — E785 Hyperlipidemia, unspecified: Secondary | ICD-10-CM | POA: Diagnosis not present

## 2021-01-31 DIAGNOSIS — E119 Type 2 diabetes mellitus without complications: Secondary | ICD-10-CM | POA: Diagnosis not present

## 2021-01-31 DIAGNOSIS — I249 Acute ischemic heart disease, unspecified: Secondary | ICD-10-CM | POA: Diagnosis not present

## 2021-02-01 ENCOUNTER — Inpatient Hospital Stay (HOSPITAL_COMMUNITY)
Admission: AD | Admit: 2021-02-01 | Discharge: 2021-02-05 | DRG: 280 | Disposition: A | Discharge status: Skilled Nursing Facility | Payer: PPO | Source: Other Acute Inpatient Hospital | Attending: Cardiology | Admitting: Cardiology

## 2021-02-01 ENCOUNTER — Encounter (HOSPITAL_COMMUNITY): Payer: Self-pay | Admitting: Internal Medicine

## 2021-02-01 ENCOUNTER — Other Ambulatory Visit: Payer: Self-pay

## 2021-02-01 ENCOUNTER — Inpatient Hospital Stay (HOSPITAL_COMMUNITY)
Admission: AD | Disposition: A | Discharge status: Skilled Nursing Facility | Payer: Self-pay | Source: Other Acute Inpatient Hospital | Attending: Cardiology

## 2021-02-01 DIAGNOSIS — I2510 Atherosclerotic heart disease of native coronary artery without angina pectoris: Secondary | ICD-10-CM

## 2021-02-01 DIAGNOSIS — F32A Depression, unspecified: Secondary | ICD-10-CM | POA: Diagnosis not present

## 2021-02-01 DIAGNOSIS — E785 Hyperlipidemia, unspecified: Secondary | ICD-10-CM | POA: Diagnosis present

## 2021-02-01 DIAGNOSIS — Z8249 Family history of ischemic heart disease and other diseases of the circulatory system: Secondary | ICD-10-CM

## 2021-02-01 DIAGNOSIS — F419 Anxiety disorder, unspecified: Secondary | ICD-10-CM | POA: Diagnosis present

## 2021-02-01 DIAGNOSIS — Z7902 Long term (current) use of antithrombotics/antiplatelets: Secondary | ICD-10-CM

## 2021-02-01 DIAGNOSIS — F0390 Unspecified dementia without behavioral disturbance: Secondary | ICD-10-CM | POA: Diagnosis present

## 2021-02-01 DIAGNOSIS — E7800 Pure hypercholesterolemia, unspecified: Secondary | ICD-10-CM | POA: Diagnosis not present

## 2021-02-01 DIAGNOSIS — I700 Atherosclerosis of aorta: Secondary | ICD-10-CM | POA: Diagnosis present

## 2021-02-01 DIAGNOSIS — E119 Type 2 diabetes mellitus without complications: Secondary | ICD-10-CM

## 2021-02-01 DIAGNOSIS — G4733 Obstructive sleep apnea (adult) (pediatric): Secondary | ICD-10-CM | POA: Diagnosis not present

## 2021-02-01 DIAGNOSIS — Z20822 Contact with and (suspected) exposure to covid-19: Secondary | ICD-10-CM | POA: Diagnosis present

## 2021-02-01 DIAGNOSIS — K219 Gastro-esophageal reflux disease without esophagitis: Secondary | ICD-10-CM | POA: Diagnosis present

## 2021-02-01 DIAGNOSIS — I951 Orthostatic hypotension: Secondary | ICD-10-CM | POA: Diagnosis not present

## 2021-02-01 DIAGNOSIS — I161 Hypertensive emergency: Secondary | ICD-10-CM | POA: Diagnosis present

## 2021-02-01 DIAGNOSIS — G8929 Other chronic pain: Secondary | ICD-10-CM | POA: Diagnosis present

## 2021-02-01 DIAGNOSIS — G4700 Insomnia, unspecified: Secondary | ICD-10-CM | POA: Diagnosis not present

## 2021-02-01 DIAGNOSIS — Z8673 Personal history of transient ischemic attack (TIA), and cerebral infarction without residual deficits: Secondary | ICD-10-CM | POA: Diagnosis not present

## 2021-02-01 DIAGNOSIS — I214 Non-ST elevation (NSTEMI) myocardial infarction: Principal | ICD-10-CM | POA: Diagnosis present

## 2021-02-01 DIAGNOSIS — T82855A Stenosis of coronary artery stent, initial encounter: Secondary | ICD-10-CM | POA: Diagnosis not present

## 2021-02-01 DIAGNOSIS — N400 Enlarged prostate without lower urinary tract symptoms: Secondary | ICD-10-CM | POA: Diagnosis not present

## 2021-02-01 DIAGNOSIS — E11621 Type 2 diabetes mellitus with foot ulcer: Secondary | ICD-10-CM | POA: Diagnosis not present

## 2021-02-01 DIAGNOSIS — Z79899 Other long term (current) drug therapy: Secondary | ICD-10-CM

## 2021-02-01 DIAGNOSIS — I25110 Atherosclerotic heart disease of native coronary artery with unstable angina pectoris: Secondary | ICD-10-CM | POA: Diagnosis not present

## 2021-02-01 DIAGNOSIS — I2582 Chronic total occlusion of coronary artery: Secondary | ICD-10-CM | POA: Diagnosis not present

## 2021-02-01 DIAGNOSIS — I2542 Coronary artery dissection: Secondary | ICD-10-CM | POA: Diagnosis present

## 2021-02-01 DIAGNOSIS — I10 Essential (primary) hypertension: Secondary | ICD-10-CM | POA: Diagnosis not present

## 2021-02-01 DIAGNOSIS — Z888 Allergy status to other drugs, medicaments and biological substances status: Secondary | ICD-10-CM

## 2021-02-01 DIAGNOSIS — K5090 Crohn's disease, unspecified, without complications: Secondary | ICD-10-CM | POA: Diagnosis not present

## 2021-02-01 DIAGNOSIS — R079 Chest pain, unspecified: Secondary | ICD-10-CM | POA: Diagnosis not present

## 2021-02-01 DIAGNOSIS — Z7901 Long term (current) use of anticoagulants: Secondary | ICD-10-CM | POA: Diagnosis not present

## 2021-02-01 DIAGNOSIS — R55 Syncope and collapse: Secondary | ICD-10-CM | POA: Diagnosis not present

## 2021-02-01 DIAGNOSIS — Y831 Surgical operation with implant of artificial internal device as the cause of abnormal reaction of the patient, or of later complication, without mention of misadventure at the time of the procedure: Secondary | ICD-10-CM | POA: Diagnosis not present

## 2021-02-01 DIAGNOSIS — N183 Chronic kidney disease, stage 3 unspecified: Secondary | ICD-10-CM | POA: Diagnosis not present

## 2021-02-01 DIAGNOSIS — N320 Bladder-neck obstruction: Secondary | ICD-10-CM | POA: Diagnosis not present

## 2021-02-01 DIAGNOSIS — F322 Major depressive disorder, single episode, severe without psychotic features: Secondary | ICD-10-CM | POA: Diagnosis not present

## 2021-02-01 DIAGNOSIS — L97519 Non-pressure chronic ulcer of other part of right foot with unspecified severity: Secondary | ICD-10-CM | POA: Diagnosis present

## 2021-02-01 DIAGNOSIS — I4510 Unspecified right bundle-branch block: Secondary | ICD-10-CM | POA: Diagnosis not present

## 2021-02-01 DIAGNOSIS — R0602 Shortness of breath: Secondary | ICD-10-CM | POA: Diagnosis not present

## 2021-02-01 HISTORY — PX: LEFT HEART CATH AND CORONARY ANGIOGRAPHY: CATH118249

## 2021-02-01 HISTORY — PX: CORONARY STENT INTERVENTION: CATH118234

## 2021-02-01 LAB — CBC WITH DIFFERENTIAL/PLATELET
Abs Immature Granulocytes: 0.15 10*3/uL — ABNORMAL HIGH (ref 0.00–0.07)
Basophils Absolute: 0.1 10*3/uL (ref 0.0–0.1)
Basophils Relative: 1 %
Eosinophils Absolute: 0.2 10*3/uL (ref 0.0–0.5)
Eosinophils Relative: 2 %
HCT: 33 % — ABNORMAL LOW (ref 39.0–52.0)
Hemoglobin: 10.6 g/dL — ABNORMAL LOW (ref 13.0–17.0)
Immature Granulocytes: 2 %
Lymphocytes Relative: 17 %
Lymphs Abs: 1.6 10*3/uL (ref 0.7–4.0)
MCH: 26.4 pg (ref 26.0–34.0)
MCHC: 32.1 g/dL (ref 30.0–36.0)
MCV: 82.3 fL (ref 80.0–100.0)
Monocytes Absolute: 0.8 10*3/uL (ref 0.1–1.0)
Monocytes Relative: 8 %
Neutro Abs: 7 10*3/uL (ref 1.7–7.7)
Neutrophils Relative %: 70 %
Platelets: 188 10*3/uL (ref 150–400)
RBC: 4.01 MIL/uL — ABNORMAL LOW (ref 4.22–5.81)
RDW: 15.5 % (ref 11.5–15.5)
WBC: 9.8 10*3/uL (ref 4.0–10.5)
nRBC: 0 % (ref 0.0–0.2)

## 2021-02-01 LAB — RESP PANEL BY RT-PCR (FLU A&B, COVID) ARPGX2
Influenza A by PCR: NEGATIVE
Influenza B by PCR: NEGATIVE
SARS Coronavirus 2 by RT PCR: NEGATIVE

## 2021-02-01 LAB — BASIC METABOLIC PANEL
Anion gap: 10 (ref 5–15)
BUN: 11 mg/dL (ref 8–23)
CO2: 23 mmol/L (ref 22–32)
Calcium: 9 mg/dL (ref 8.9–10.3)
Chloride: 105 mmol/L (ref 98–111)
Creatinine, Ser: 0.86 mg/dL (ref 0.61–1.24)
GFR, Estimated: >60 mL/min (ref >60)
Glucose, Bld: 194 mg/dL — ABNORMAL HIGH (ref 70–99)
Potassium: 3.8 mmol/L (ref 3.5–5.1)
Sodium: 138 mmol/L (ref 135–145)

## 2021-02-01 LAB — MAGNESIUM: Magnesium: 1.5 mg/dL — ABNORMAL LOW (ref 1.7–2.4)

## 2021-02-01 LAB — HEMOGLOBIN A1C
Hgb A1c MFr Bld: 7 % — ABNORMAL HIGH (ref 4.8–5.6)
Mean Plasma Glucose: 154.2 mg/dL

## 2021-02-01 LAB — HEPATIC FUNCTION PANEL
ALT: 20 U/L (ref 0–44)
AST: 33 U/L (ref 15–41)
Albumin: 3.2 g/dL — ABNORMAL LOW (ref 3.5–5.0)
Alkaline Phosphatase: 67 U/L (ref 38–126)
Bilirubin, Direct: 0.1 mg/dL (ref 0.0–0.2)
Indirect Bilirubin: 0.6 mg/dL (ref 0.3–0.9)
Total Bilirubin: 0.7 mg/dL (ref 0.3–1.2)
Total Protein: 6 g/dL — ABNORMAL LOW (ref 6.5–8.1)

## 2021-02-01 LAB — GLUCOSE, CAPILLARY
Glucose-Capillary: 164 mg/dL — ABNORMAL HIGH (ref 70–99)
Glucose-Capillary: 154 mg/dL — ABNORMAL HIGH (ref 70–99)

## 2021-02-01 LAB — POCT ACTIVATED CLOTTING TIME
Activated Clotting Time: 232 seconds
Activated Clotting Time: 267 seconds

## 2021-02-01 LAB — TSH: TSH: 2.557 u[IU]/mL (ref 0.350–4.500)

## 2021-02-01 SURGERY — LEFT HEART CATH AND CORONARY ANGIOGRAPHY
Anesthesia: LOCAL

## 2021-02-01 MED ORDER — MORPHINE SULFATE (PF) 4 MG/ML IV SOLN
INTRAVENOUS | Status: DC | PRN
  Administered 2021-02-01: 2 mg via INTRAVENOUS

## 2021-02-01 MED ORDER — CLOPIDOGREL BISULFATE 75 MG PO TABS
75.0000 mg | ORAL_TABLET | Freq: Every day | ORAL | Status: DC
  Administered 2021-02-02 – 2021-02-05 (×4): 75 mg via ORAL
  Filled 2021-02-01 (×4): qty 1

## 2021-02-01 MED ORDER — SODIUM CHLORIDE 0.9% FLUSH: 3.0000 mL | INTRAVENOUS | Status: DC | PRN

## 2021-02-01 MED ORDER — FENTANYL CITRATE (PF) 100 MCG/2ML IJ SOLN
INTRAMUSCULAR | Status: DC | PRN
  Administered 2021-02-01 (×2): 25 ug via INTRAVENOUS

## 2021-02-01 MED ORDER — ONDANSETRON HCL 4 MG/2ML IJ SOLN: 4.0000 mg | Freq: Four times a day (QID) | INTRAMUSCULAR | Status: DC | PRN

## 2021-02-01 MED ORDER — NITROGLYCERIN IN D5W 200-5 MCG/ML-% IV SOLN
INTRAVENOUS | Status: AC
  Filled 2021-02-01: qty 250

## 2021-02-01 MED ORDER — ASPIRIN 81 MG PO CHEW: 81.0000 mg | CHEWABLE_TABLET | ORAL | Status: AC

## 2021-02-01 MED ORDER — SODIUM CHLORIDE 0.9% FLUSH
3.0000 mL | Freq: Two times a day (BID) | INTRAVENOUS | Status: DC
  Administered 2021-02-01 – 2021-02-04 (×6): 3 mL via INTRAVENOUS

## 2021-02-01 MED ORDER — HEPARIN SODIUM (PORCINE) 1000 UNIT/ML IJ SOLN
INTRAMUSCULAR | Status: AC
  Filled 2021-02-01: qty 1

## 2021-02-01 MED ORDER — NITROGLYCERIN 0.4 MG SL SUBL: 0.4000 mg | SUBLINGUAL_TABLET | SUBLINGUAL | Status: DC | PRN

## 2021-02-01 MED ORDER — LIDOCAINE HCL (PF) 1 % IJ SOLN
INTRAMUSCULAR | Status: AC
  Filled 2021-02-01: qty 30

## 2021-02-01 MED ORDER — MIDAZOLAM HCL 2 MG/2ML IJ SOLN
INTRAMUSCULAR | Status: AC
  Filled 2021-02-01: qty 2

## 2021-02-01 MED ORDER — SODIUM CHLORIDE 0.9 % IV SOLN: 250.0000 mL | INTRAVENOUS | Status: DC | PRN

## 2021-02-01 MED ORDER — HYDRALAZINE HCL 20 MG/ML IJ SOLN
10.0000 mg | INTRAMUSCULAR | Status: AC | PRN
  Filled 2021-02-01: qty 1

## 2021-02-01 MED ORDER — HEPARIN (PORCINE) IN NACL 1000-0.9 UT/500ML-% IV SOLN
INTRAVENOUS | Status: AC
  Filled 2021-02-01: qty 1500

## 2021-02-01 MED ORDER — ALBUTEROL SULFATE HFA 108 (90 BASE) MCG/ACT IN AERS
2.0000 | INHALATION_SPRAY | Freq: Four times a day (QID) | RESPIRATORY_TRACT | Status: DC | PRN
  Filled 2021-02-01: qty 6.7

## 2021-02-01 MED ORDER — NITROGLYCERIN IN D5W 200-5 MCG/ML-% IV SOLN
0.0000 ug/min | INTRAVENOUS | Status: DC
  Administered 2021-02-02: 55 ug/min via INTRAVENOUS
  Filled 2021-02-01: qty 250

## 2021-02-01 MED ORDER — ACETAMINOPHEN 325 MG PO TABS
650.0000 mg | ORAL_TABLET | ORAL | Status: DC | PRN
  Administered 2021-02-01 – 2021-02-04 (×3): 650 mg via ORAL
  Filled 2021-02-01 (×3): qty 2

## 2021-02-01 MED ORDER — FENTANYL CITRATE (PF) 100 MCG/2ML IJ SOLN
INTRAMUSCULAR | Status: AC
  Filled 2021-02-01: qty 2

## 2021-02-01 MED ORDER — IOHEXOL 350 MG/ML SOLN
INTRAVENOUS | Status: DC | PRN
  Administered 2021-02-01: 100 mL via INTRA_ARTERIAL

## 2021-02-01 MED ORDER — LABETALOL HCL 5 MG/ML IV SOLN: 10.0000 mg | INTRAVENOUS | Status: AC | PRN

## 2021-02-01 MED ORDER — MAGNESIUM OXIDE 400 (241.3 MG) MG PO TABS
400.0000 mg | ORAL_TABLET | Freq: Every day | ORAL | Status: DC
  Administered 2021-02-01 – 2021-02-04 (×4): 400 mg via ORAL
  Filled 2021-02-01 (×4): qty 1

## 2021-02-01 MED ORDER — ENOXAPARIN SODIUM 40 MG/0.4ML ~~LOC~~ SOLN
40.0000 mg | SUBCUTANEOUS | Status: DC
  Administered 2021-02-02 – 2021-02-05 (×4): 40 mg via SUBCUTANEOUS
  Filled 2021-02-01 (×4): qty 0.4

## 2021-02-01 MED ORDER — INSULIN ASPART 100 UNIT/ML ~~LOC~~ SOLN
0.0000 [IU] | Freq: Every day | SUBCUTANEOUS | Status: DC
  Administered 2021-02-02 – 2021-02-03 (×2): 3 [IU] via SUBCUTANEOUS
  Administered 2021-02-04: 4 [IU] via SUBCUTANEOUS

## 2021-02-01 MED ORDER — VERAPAMIL HCL 2.5 MG/ML IV SOLN
INTRAVENOUS | Status: AC
  Filled 2021-02-01: qty 2

## 2021-02-01 MED ORDER — HEPARIN SODIUM (PORCINE) 1000 UNIT/ML IJ SOLN
INTRAMUSCULAR | Status: DC | PRN
  Administered 2021-02-01 (×2): 4000 [IU] via INTRAVENOUS

## 2021-02-01 MED ORDER — CARVEDILOL 6.25 MG PO TABS
6.2500 mg | ORAL_TABLET | Freq: Two times a day (BID) | ORAL | Status: DC
  Administered 2021-02-02: 6.25 mg via ORAL
  Filled 2021-02-01: qty 1

## 2021-02-01 MED ORDER — CLOPIDOGREL BISULFATE 300 MG PO TABS
ORAL_TABLET | ORAL | Status: DC | PRN
  Administered 2021-02-01: 300 mg via ORAL

## 2021-02-01 MED ORDER — HEPARIN (PORCINE) 25000 UT/250ML-% IV SOLN: 1100.0000 [IU]/h | INTRAVENOUS | Status: DC

## 2021-02-01 MED ORDER — ATORVASTATIN CALCIUM 40 MG PO TABS
40.0000 mg | ORAL_TABLET | Freq: Every day | ORAL | Status: DC
  Administered 2021-02-02 – 2021-02-04 (×3): 40 mg via ORAL
  Filled 2021-02-01 (×3): qty 1

## 2021-02-01 MED ORDER — HEPARIN (PORCINE) IN NACL 1000-0.9 UT/500ML-% IV SOLN
INTRAVENOUS | Status: DC | PRN
  Administered 2021-02-01 (×2): 500 mL

## 2021-02-01 MED ORDER — MESALAMINE ER 0.375 G PO CP24: 1.5000 g | ORAL_CAPSULE | Freq: Every day | ORAL | Status: DC

## 2021-02-01 MED ORDER — TAMSULOSIN HCL 0.4 MG PO CAPS
0.4000 mg | ORAL_CAPSULE | Freq: Every day | ORAL | Status: DC
  Administered 2021-02-02 – 2021-02-04 (×3): 0.4 mg via ORAL
  Filled 2021-02-01 (×3): qty 1

## 2021-02-01 MED ORDER — CLOPIDOGREL BISULFATE 300 MG PO TABS
ORAL_TABLET | ORAL | Status: AC
  Filled 2021-02-01: qty 1

## 2021-02-01 MED ORDER — DULOXETINE HCL 30 MG PO CPEP
30.0000 mg | ORAL_CAPSULE | Freq: Two times a day (BID) | ORAL | Status: DC
  Administered 2021-02-01 – 2021-02-04 (×7): 30 mg via ORAL
  Filled 2021-02-01 (×8): qty 1

## 2021-02-01 MED ORDER — NITROGLYCERIN 1 MG/10 ML FOR IR/CATH LAB
INTRA_ARTERIAL | Status: AC
  Filled 2021-02-01: qty 10

## 2021-02-01 MED ORDER — VERAPAMIL HCL 2.5 MG/ML IV SOLN
INTRAVENOUS | Status: DC | PRN
  Administered 2021-02-01: 10 mL via INTRA_ARTERIAL

## 2021-02-01 MED ORDER — MIDAZOLAM HCL 2 MG/2ML IJ SOLN
INTRAMUSCULAR | Status: DC | PRN
  Administered 2021-02-01 (×2): 1 mg via INTRAVENOUS

## 2021-02-01 MED ORDER — LIDOCAINE HCL (PF) 1 % IJ SOLN
INTRAMUSCULAR | Status: DC | PRN
  Administered 2021-02-01: 2 mL

## 2021-02-01 MED ORDER — TEMAZEPAM 15 MG PO CAPS
30.0000 mg | ORAL_CAPSULE | Freq: Every day | ORAL | Status: DC
  Administered 2021-02-01 – 2021-02-04 (×4): 30 mg via ORAL
  Filled 2021-02-01 (×4): qty 2

## 2021-02-01 MED ORDER — MORPHINE SULFATE (PF) 4 MG/ML IV SOLN
INTRAVENOUS | Status: AC
  Filled 2021-02-01: qty 1

## 2021-02-01 MED ORDER — ASPIRIN EC 81 MG PO TBEC
81.0000 mg | DELAYED_RELEASE_TABLET | Freq: Every day | ORAL | Status: DC
  Administered 2021-02-02 – 2021-02-04 (×3): 81 mg via ORAL
  Filled 2021-02-01 (×3): qty 1

## 2021-02-01 MED ORDER — ACETAMINOPHEN ER 650 MG PO TBCR: 1300.0000 mg | EXTENDED_RELEASE_TABLET | ORAL | Status: DC

## 2021-02-01 MED ORDER — PANTOPRAZOLE SODIUM 40 MG PO TBEC
40.0000 mg | DELAYED_RELEASE_TABLET | Freq: Every day | ORAL | Status: DC
  Administered 2021-02-02 – 2021-02-04 (×3): 40 mg via ORAL
  Filled 2021-02-01 (×3): qty 1

## 2021-02-01 MED ORDER — SODIUM CHLORIDE 0.9 % IV SOLN: INTRAVENOUS | Status: AC

## 2021-02-01 MED ORDER — NITROGLYCERIN IN D5W 200-5 MCG/ML-% IV SOLN
0.0000 ug/min | INTRAVENOUS | Status: DC
Start: 2021-02-01 — End: 2021-02-01
  Administered 2021-02-01: 30 ug/min via INTRAVENOUS
  Administered 2021-02-01: 50 ug/min via INTRAVENOUS

## 2021-02-01 MED ORDER — SODIUM CHLORIDE 0.9 % IV SOLN
INTRAVENOUS | Status: DC
Start: 2021-02-01 — End: 2021-02-01

## 2021-02-01 MED ORDER — INSULIN ASPART 100 UNIT/ML ~~LOC~~ SOLN
0.0000 [IU] | Freq: Three times a day (TID) | SUBCUTANEOUS | Status: DC
  Administered 2021-02-02: 3 [IU] via SUBCUTANEOUS
  Administered 2021-02-02: 5 [IU] via SUBCUTANEOUS
  Administered 2021-02-02: 1 [IU] via SUBCUTANEOUS
  Administered 2021-02-03 (×2): 3 [IU] via SUBCUTANEOUS
  Administered 2021-02-03: 5 [IU] via SUBCUTANEOUS
  Administered 2021-02-04: 3 [IU] via SUBCUTANEOUS
  Administered 2021-02-04: 5 [IU] via SUBCUTANEOUS
  Administered 2021-02-04: 3 [IU] via SUBCUTANEOUS
  Administered 2021-02-05: 2 [IU] via SUBCUTANEOUS
  Administered 2021-02-05: 5 [IU] via SUBCUTANEOUS

## 2021-02-01 MED ORDER — CHLORHEXIDINE GLUCONATE CLOTH 2 % EX PADS
6.0000 | MEDICATED_PAD | Freq: Every day | CUTANEOUS | Status: DC
  Administered 2021-02-02: 6 via TOPICAL

## 2021-02-01 MED ORDER — MORPHINE SULFATE (PF) 2 MG/ML IV SOLN
INTRAVENOUS | Status: DC | PRN
  Administered 2021-02-01: 2 mg via INTRAVENOUS

## 2021-02-01 SURGICAL SUPPLY — 13 items
CATH 5FR JL3.5 JR4 ANG PIG MP (CATHETERS) ×2 IMPLANT
CATH LAUNCHER 5F AR1 (CATHETERS) ×2 IMPLANT
CATH LAUNCHER 6FR AL.75 (CATHETERS) ×2 IMPLANT
DEVICE RAD COMP TR BAND LRG (VASCULAR PRODUCTS) ×2 IMPLANT
GLIDESHEATH SLEND SS 6F .021 (SHEATH) ×2 IMPLANT
GUIDEWIRE INQWIRE 1.5J.035X260 (WIRE) ×1 IMPLANT
INQWIRE 1.5J .035X260CM (WIRE) ×2
KIT ENCORE 26 ADVANTAGE (KITS) ×2 IMPLANT
KIT HEART LEFT (KITS) ×2 IMPLANT
PACK CARDIAC CATHETERIZATION (CUSTOM PROCEDURE TRAY) ×2 IMPLANT
TRANSDUCER W/STOPCOCK (MISCELLANEOUS) ×2 IMPLANT
TUBING CIL FLEX 10 FLL-RA (TUBING) ×2 IMPLANT
WIRE RUNTHROUGH .014X180CM (WIRE) ×2 IMPLANT

## 2021-02-01 NOTE — H&P (Addendum)
Cardiology Admission History and Physical:   Patient ID: Frank Phillips MRN: [id number]; DOB: Dec 10, 1945   Admission date: 02/01/2021  PCP:  Myrlene Broker, MD   [facility]  Cardiologist:  No primary care provider on file. New has seen Dr. Orlinda Blalock at [facility]   Dr. Harriet Masson at [facility] Provider:  No care team member to display Electrophysiologist:  None        Chief Complaint:  Chest pain 01/29/21 to [facility].   Patient Profile:   Frank Phillips is a 75 y.o. male with hx SVT, NSVT, syncope, orhtostatic hypotension, CVA, HTN, HLD, DM-2 OSA, anxiety, memory loss, GERD, chronic pain and nephrolithiasis now transferred from [facility] after presenting with chest pain.   History of Present Illness:   Frank Phillips  With hx of echo 06/02/20 borderline concentric LVH, EF 55-60%, mild MR, mild PR, trace TR.    EKG 03/2019 SR with PACs RBBB Zio patch freq PACs, SVT runs longest 27 sec. freq PVCs, 3 episodes of 4 beats of NSVT.  Hx of ureteral stents for kidney stones.  No prior hx of CAD or coronary stents.  Pt was touring assisted living facility and suddenly developed chest pain, with radiation to neck, his daughter reported he became pale. Pain was pressure-like and squeezing, + diaphoresis.   rec'd 324 mg ASA. And sent to ER, BP in ER 218/93, up to 247/118, EKG SR with RBBB.    Dr. Harriet Masson has seen in consult.  Troponin pk 10.60 with Troponin I on 01/30/21 and to 6.56 on 01/31/21.  meds added ASA 81 mg, Coreg 6.25 BID, crestor 10 mg hs, anf IV NTG and IV heparin. Marland Kitchen  He is on glipizide, meformin,    CXR NAD, aortic atherosclerosis. Echo 01/30/21 EF 55-60%, impaired relaxation  Basal in, and inferoseptal LV wall motion is hypokinetic.   Na 136Hgb 14, plts 244 WBC 9.2 , K+ 3.9, Cr 0.80, A1c 7.7, mg +1.70   EKG:  The ECG that was done 01/29/21 and 01/30/21 was personally reviewed and demonstrates SR with RBBB and no acute changed, hx of RBBB. No acute ST changes.    Currently resting comfortably, no chest pain and no SOB.   Past Medical History:  Diagnosis Date  . Anxiety   . Aortic atherosclerosis ([place]) 06/06/2018  . Arthritis   . Complication of anesthesia    " i HAD SOME KIND OF PROBLEM ,BUT I DONT REMEMBER WHAT IT WAS "  . Crohn disease ([place])   . Dementia ([place])   . Depression   . Diabetes ([place])   . Fall at home 04/06/2016  . High blood pressure   . High cholesterol   . Kidney stones   . Orthostatic hypotension 03/2016  . Stroke ([place])   . UTI (urinary tract infection) 05/2018   WITH HEMATURIA    Past Surgical History:  Procedure Laterality Date  . ANGIOPLASTY / STENTING FEMORAL    . APPENDECTOMY    . kidney stones    . TONSILLECTOMY    . TONSILLECTOMY AND ADENOIDECTOMY       Medications Prior to Admission: Prior to Admission medications   Medication Sig Start Date End Date Taking? Authorizing Provider  acetaminophen (TYLENOL) 650 MG CR tablet Take 1,300 mg by mouth every morning.     [provider]  albuterol (PROAIR HFA) 108 (90 BASE) MCG/ACT inhaler Inhale 2 puffs into the lungs every 6 (six) hours as needed for  wheezing or shortness of breath.     [provider]  APRISO 0.375 g 24 hr capsule Take 1,500 mg by mouth daily. pain 03/29/16   [provider]  beclomethasone (QVAR) 40 MCG/ACT inhaler Take 2 puffs first thing in am and then another 2 puffs about 12 hours later. Patient not taking: Reported on 04/06/2016 12/04/13   Tanda Rockers, MD  clopidogrel (PLAVIX) 75 MG tablet Take 75 mg by mouth daily with breakfast.    [provider]  DULoxetine (CYMBALTA) 30 MG capsule Take 30 mg by mouth 2 (two) times a day. 02/15/19   [provider]  glipiZIDE (GLUCOTROL) 10 MG tablet Take 10 mg by mouth 2 (two) times daily before a meal.    [provider]  Magnesium Oxide 400 MG CAPS Take 1 capsule (400 mg total) by mouth at bedtime. 06/09/18   Florencia Reasons, MD  pantoprazole (PROTONIX) 40  MG tablet Take 40 mg by mouth daily.  11/24/14   [provider]  tamsulosin (FLOMAX) 0.4 MG CAPS capsule Take 0.4 mg by mouth daily.  12/02/14   [provider]  temazepam (RESTORIL) 30 MG capsule Take 30 mg by mouth at bedtime.  01/23/18   [provider]     Allergies:    Allergies  Allergen Reactions  . Levaquin [Levofloxacin]     Out of mind    Social History:   Social History   Socioeconomic History  . Marital status: Married    Spouse name: Not on file  . Number of children: Not on file  . Years of education: Not on file  . Highest education level: Not on file  Occupational History  . Occupation: Retired    Comment: Writer  Tobacco Use  . Smoking status: Never Smoker  . Smokeless tobacco: Never Used  Vaping Use  . Vaping Use: Never used  Substance and Sexual Activity  . Alcohol use: No  . Drug use: No  . Sexual activity: Not on file  Other Topics Concern  . Not on file  Social History Narrative  . Not on file   Social Determinants of Health   Financial Resource Strain: Not on file  Food Insecurity: Not on file  Transportation Needs: Not on file  Physical Activity: Not on file  Stress: Not on file  Social Connections: Not on file  Intimate Partner Violence: Not on file    Family History:   The patient's family history includes Cancer in his sister; Heart disease in his father and mother.    ROS:  Please see the history of present illness.  General:no colds or fevers, no weight changes Skin:no rashes or ulcers HEENT:no blurred vision, no congestion CV:see HPI PUL:see HPI GI:no diarrhea constipation or melena, no indigestion GU:no hematuria, no dysuria MS:no joint pain, no claudication Neuro:no recent syncope, no lightheadedness Endo:+ diabetes, no thyroid disease  All other ROS reviewed and negative.     Physical Exam/Data:   Vitals:   02/01/21 1024  BP: 119/63  Pulse: 60  Resp: 19  Temp: 98.1 F (36.7 C)   TempSrc: Oral   No intake or output data in the 24 hours ending 02/01/21 1123 Last 3 Weights 05/05/2019 06/06/2018 06/05/2018  Weight (lbs) 160 lb 4.4 oz 161 lb 9.6 oz 156 lb  Weight (kg) 72.7 kg 73.3 kg 70.761 kg     There is no height or weight on file to calculate BMI.  General:  Well nourished, well developed,  in no acute distress HEENT: normal Lymph: no adenopathy Neck: no JVD Endocrine:  No thryomegaly Vascular: No carotid bruits; FA pulses 2+ bilaterally without bruits  Cardiac:  normal S1, S2; RRR; no murmur gallup rub or click Lungs:  clear to auscultation bilaterally, no wheezing, occ rhonchi no rales  Abd: soft, nontender, no hepatomegaly  Ext: no lower ext edema Musculoskeletal:  No deformities, BUE and BLE strength normal and equal Skin: warm and dry  Neuro:  Poor memory, follows commands, no focal abnormalities noted Psych:  Normal affect      Relevant CV Studies: Echo see above   Laboratory Data:  High Sensitivity Troponin:  No results for input(s): TROPONINIHS in the last 720 hours.    ChemistryNo results for input(s): NA, K, CL, CO2, GLUCOSE, BUN, CREATININE, CALCIUM, GFRNONAA, GFRAA, ANIONGAP in the last 168 hours.  No results for input(s): PROT, ALBUMIN, AST, ALT, ALKPHOS, BILITOT in the last 168 hours. HematologyNo results for input(s): WBC, RBC, HGB, HCT, MCV, MCH, MCHC, RDW, PLT in the last 168 hours. BNPNo results for input(s): BNP, PROBNP in the last 168 hours.  DDimer No results for input(s): DDIMER in the last 168 hours.   Radiology/Studies:  No results found.   Assessment and Plan:   1. NSTEMI with troponin 10 plan for cardiac cath today or tomorrow - no prior hx of CAD,  no acute EKG changes, chronic RBBB, abnormal Echo with inf wall hypokinesis not present in 2017. On IV heparin and NTG   Continue BB,coreg and crestor and ASA 2. Hypertensive emergency on admit BP now 119/63 on IV NTG.   3. Dementia was planning to move to assisted living. On  Aricept  4. DM-2 will plan SSI for now, on metformin and glipizide at home. 5. GERD on protonix continue    The patient understands that risks included but are not limited to stroke (1 in 1000), death (1 in 35), kidney failure [usually temporary] (1 in 500), bleeding (1 in 200), allergic reaction [possibly serious] (1 in 200).   Discussed with pt and called his daughter both agreed to proceed.  Risk Assessment/Risk Scores:     TIMI Risk Score for Unstable Angina or Non-ST Elevation MI:   The patient's TIMI risk score is 4, which indicates a 20% risk of all cause mortality, new or recurrent myocardial infarction or need for urgent revascularization in the next 14 days.       Severity of Illness: The appropriate patient status for this patient is INPATIENT. Inpatient status is judged to be reasonable and necessary in order to provide the required intensity of service to ensure the patient's safety. The patient's presenting symptoms, physical exam findings, and initial radiographic and laboratory data in the context of their chronic comorbidities is felt to place them at high risk for further clinical deterioration. Furthermore, it is not anticipated that the patient will be medically stable for discharge from the hospital within 2 midnights of admission. The following factors support the patient status of inpatient.   " The patient's presenting symptoms include acute chest pain , diaphoresis and HTN. " The worrisome physical exam findings include diaphoresis. " The initial radiographic and laboratory data are worrisome because of elevated troponin. " The chronic co-morbidities include DM, FH, .   * I certify that at the point of admission it is my clinical judgment that the patient will require inpatient hospital care spanning beyond 2 midnights from the point of admission due to high intensity of service,  high risk for further deterioration and high frequency of surveillance required.*     For questions or updates, please contact [facility] Please consult [email or web address] for contact info under     Signed, Cecilie Kicks, NP  02/01/2021 11:23 AM   Patient seen and examined and agree with Cecilie Kicks, NP as detailed above.  In brief, the patient is a 75 year old male with history of SVT, NSVT, syncope, orhtostatic hypotension, CVA, HTN, HLD, DM-2 OSA, anxiety, memory loss, GERD, chronic pain and nephrolithiasis who initially presented to [facility] with chest pain radiating to his neck while walking and touring an assisted living facility found to have trop peak 10.6 with basal inferior and inferoseptal hypokinesis on TTE now transferred to [facility] hospital for coronary angiography.  Currently, the patient is comfortable and chest pain free. ECG with NSR with RBBB. Echo 01/30/21 EF 55-60%, impaired relaxation with basal inferior and inferoseptal LV hypokinesis. Planned for cath today.   Plan: GEN: No acute distress.   Neck: No JVD Cardiac: RRR, no murmurs, rubs, or gallops.  Respiratory: Clear to auscultation bilaterally. GI: Soft, nontender, non-distended  MS: No edema; No deformity. Neuro:  Nonfocal  Psych: Normal affect   Plan: -Plan for LHC today -Continue heparin gtt -Continue aspirin 62m daily and lipitor 443mdaily -[facility] and [facility] as needed pending cath -Continue coreg 6.71m75mD -On nitro gtt for HTN emergency; BP well controlled  Plan discussed with patient's daughter.  INFORMED CONSENT: I have reviewed the risks, indications, and alternatives to cardiac catheterization, possible angioplasty, and stenting with the patient. Risks include but are not limited to bleeding, infection, vascular injury, stroke, myocardial infection, arrhythmia, kidney injury, radiation-related injury in the case of prolonged fluoroscopy use, emergency cardiac surgery, and death. The patient understands the risks of serious  complication is 1-2 in [id number] diagnostic cardiac cath and 1-2% or less with angioplasty/stenting.   HeatGwyndolyn Kaufman

## 2021-02-01 NOTE — Progress Notes (Signed)
ANTICOAGULATION CONSULT NOTE - Initial Consult  Pharmacy Consult for heparin Indication: chest pain/ACS  Allergies  Allergen Reactions  . Levaquin [Levofloxacin]     Out of mind    Patient Measurements:   Vital Signs: Temp: 98.1 F (36.7 C) (04/04 1024) Temp Source: Oral (04/04 1024) BP: 119/63 (04/04 1024) Pulse Rate: 60 (04/04 1024)  Labs: No results for input(s): HGB, HCT, PLT, APTT, LABPROT, INR, HEPARINUNFRC, HEPRLOWMOCWT, CREATININE, CKTOTAL, CKMB, TROPONINIHS in the last 72 hours.  CrCl cannot be calculated (Patient's most recent lab result is older than the maximum 21 days allowed.).   Medical History: Past Medical History:  Diagnosis Date  . Anxiety   . Aortic atherosclerosis ([place]) 06/06/2018  . Arthritis   . Complication of anesthesia    " i HAD SOME KIND OF PROBLEM ,BUT I DONT REMEMBER WHAT IT WAS "  . Crohn disease ([place])   . Dementia ([place])   . Depression   . Diabetes ([place])   . Fall at home 04/06/2016  . High blood pressure   . High cholesterol   . Kidney stones   . Orthostatic hypotension 03/2016  . Stroke ([place])   . UTI (urinary tract infection) 05/2018   WITH HEMATURIA    Medications:  Medications Prior to Admission  Medication Sig Dispense Refill Last Dose  . acetaminophen (TYLENOL) 650 MG CR tablet Take 1,300 mg by mouth every morning.      Marland Kitchen albuterol (PROAIR HFA) 108 (90 BASE) MCG/ACT inhaler Inhale 2 puffs into the lungs every 6 (six) hours as needed for wheezing or shortness of breath.      . APRISO 0.375 g 24 hr capsule Take 1,500 mg by mouth daily. pain     . beclomethasone (QVAR) 40 MCG/ACT inhaler Take 2 puffs first thing in am and then another 2 puffs about 12 hours later. (Patient not taking: Reported on 04/06/2016) 1 Inhaler 11   . clopidogrel (PLAVIX) 75 MG tablet Take 75 mg by mouth daily with breakfast.     . DULoxetine (CYMBALTA) 30 MG capsule Take 30 mg by mouth 2 (two) times a day.     Marland Kitchen glipiZIDE (GLUCOTROL) 10 MG tablet Take 10  mg by mouth 2 (two) times daily before a meal.     . Magnesium Oxide 400 MG CAPS Take 1 capsule (400 mg total) by mouth at bedtime. 30 capsule 0   . pantoprazole (PROTONIX) 40 MG tablet Take 40 mg by mouth daily.      . tamsulosin (FLOMAX) 0.4 MG CAPS capsule Take 0.4 mg by mouth daily.      . temazepam (RESTORIL) 30 MG capsule Take 30 mg by mouth at bedtime.       Scheduled:  . [START ON 02/02/2021] acetaminophen  1,300 mg Oral [id number]  . [START ON 02/02/2021] aspirin EC  81 mg Oral Daily  . atorvastatin  40 mg Oral Daily  . carvedilol  6.25 mg Oral BID WC  . [START ON 02/02/2021] clopidogrel  75 mg Oral Q breakfast  . DULoxetine  30 mg Oral BID  . insulin aspart  0-5 Units Subcutaneous QHS  . insulin aspart  0-9 Units Subcutaneous TID WC  . Magnesium Oxide  400 mg Oral QHS  . mesalamine  1.5 g Oral Daily  . pantoprazole  40 mg Oral Daily  . sodium chloride flush  3 mL Intravenous Q12H  . tamsulosin  0.4 mg Oral Daily  . temazepam  30 mg Oral QHS  Assessment: 75 yo male [facility] with CP. Heparin was started earlier today (rate is 1400 units/hr and pharmacy consulted to dose. Plans for cath today    Goal of Therapy:  Heparin level 0.3-0.7 units/ml Monitor platelets by anticoagulation protocol: Yes   Plan:   -Decrease heparin to 1100 units/hr -Will follow plans post cath  Hildred Laser, PharmD Clinical Pharmacist **Pharmacist phone directory can now be found on [email or web address] ([email or web address]).  Listed under [place].

## 2021-02-01 NOTE — Interval H&P Note (Signed)
History and Physical Interval Note:  02/01/2021 4:30 PM  Frank Phillips  has presented today for surgery, with the diagnosis of NSTEMI.  The various methods of treatment have been discussed with the patient and family. After consideration of risks, benefits and other options for treatment, the patient has consented to  Procedure(s): LEFT HEART CATH AND CORONARY ANGIOGRAPHY (N/A) as a surgical intervention.  The patient's history has been reviewed, patient examined, no change in status, stable for surgery.  I have reviewed the patient's chart and labs.  Questions were answered to the patient's satisfaction.    Cath Lab Visit (complete for each Cath Lab visit)  Clinical Evaluation Leading to the Procedure:   ACS: Yes.    Non-ACS:  N/A  [name]

## 2021-02-02 ENCOUNTER — Encounter (HOSPITAL_COMMUNITY): Payer: Self-pay | Admitting: Internal Medicine

## 2021-02-02 DIAGNOSIS — I214 Non-ST elevation (NSTEMI) myocardial infarction: Secondary | ICD-10-CM | POA: Diagnosis not present

## 2021-02-02 LAB — BASIC METABOLIC PANEL
Anion gap: 9 (ref 5–15)
BUN: 9 mg/dL (ref 8–23)
CO2: 22 mmol/L (ref 22–32)
Calcium: 8.6 mg/dL — ABNORMAL LOW (ref 8.9–10.3)
Chloride: 104 mmol/L (ref 98–111)
Creatinine, Ser: 0.8 mg/dL (ref 0.61–1.24)
GFR, Estimated: >60 mL/min (ref >60)
Glucose, Bld: 168 mg/dL — ABNORMAL HIGH (ref 70–99)
Potassium: 3.7 mmol/L (ref 3.5–5.1)
Sodium: 135 mmol/L (ref 135–145)

## 2021-02-02 LAB — CBC
HCT: 32.4 % — ABNORMAL LOW (ref 39.0–52.0)
Hemoglobin: 10.1 g/dL — ABNORMAL LOW (ref 13.0–17.0)
MCH: 26.1 pg (ref 26.0–34.0)
MCHC: 31.2 g/dL (ref 30.0–36.0)
MCV: 83.7 fL (ref 80.0–100.0)
Platelets: 178 10*3/uL (ref 150–400)
RBC: 3.87 MIL/uL — ABNORMAL LOW (ref 4.22–5.81)
RDW: 15.8 % — ABNORMAL HIGH (ref 11.5–15.5)
WBC: 8.9 10*3/uL (ref 4.0–10.5)
nRBC: 0 % (ref 0.0–0.2)

## 2021-02-02 LAB — GLUCOSE, CAPILLARY
Glucose-Capillary: 149 mg/dL — ABNORMAL HIGH (ref 70–99)
Glucose-Capillary: 288 mg/dL — ABNORMAL HIGH (ref 70–99)
Glucose-Capillary: 236 mg/dL — ABNORMAL HIGH (ref 70–99)
Glucose-Capillary: 286 mg/dL — ABNORMAL HIGH (ref 70–99)

## 2021-02-02 LAB — MRSA PCR SCREENING: MRSA by PCR: NEGATIVE

## 2021-02-02 MED ORDER — CARVEDILOL 12.5 MG PO TABS
12.5000 mg | ORAL_TABLET | Freq: Two times a day (BID) | ORAL | Status: DC
  Administered 2021-02-02 – 2021-02-04 (×4): 12.5 mg via ORAL
  Filled 2021-02-02 (×6): qty 1

## 2021-02-02 MED ORDER — LOSARTAN POTASSIUM 50 MG PO TABS
50.0000 mg | ORAL_TABLET | Freq: Every day | ORAL | Status: DC
  Administered 2021-02-02 – 2021-02-04 (×3): 50 mg via ORAL
  Filled 2021-02-02 (×3): qty 1

## 2021-02-02 MED ORDER — ISOSORBIDE MONONITRATE ER 30 MG PO TB24
30.0000 mg | ORAL_TABLET | Freq: Every day | ORAL | Status: DC
  Administered 2021-02-02: 30 mg via ORAL
  Filled 2021-02-02: qty 1

## 2021-02-02 MED FILL — Heparin Sod (Porcine)-NaCl IV Soln 1000 Unit/500ML-0.9%: INTRAVENOUS | Qty: 500 | Status: AC

## 2021-02-02 MED FILL — Nitroglycerin IV Soln 100 MCG/ML in D5W: INTRA_ARTERIAL | Qty: 10 | Status: AC

## 2021-02-02 NOTE — Consult Note (Signed)
[facility] Nurse Consult Note: Patient receiving care in [facility]. Reason for Consult: right great toe diabetic ulcer Wound type: DFU Pressure Injury POA: Yes/No/NA Measurement: na Wound bed: dry, stable, black scab Drainage (amount, consistency, odor) none Periwound: intact Dressing procedure/placement/frequency: Apply iodine from the swabsticks or swab pads from clean utility to dark spot on right great toe tip.  Allow to air dry. Thank you for the consult.  Discussed plan of care with the patient and bedside nurse.  [facility] nurse will not follow at this time.  Please re-consult the [facility] team if needed.  Val Riles, RN, MSN, CWOCN, CNS-BC, pager [phone]

## 2021-02-02 NOTE — Progress Notes (Signed)
CARDIAC REHAB PHASE I   PRE:  Rate/Rhythm: 28 SR with PAC/PVCs    BP: sitting 136/70    SaO2: 93 RA  MODE:  Ambulation: 190 ft   POST:  Rate/Rhythm: 92 SR with PAC/PVCs    BP: sitting 162/71     SaO2: 93 RA  Pt weak, small steps. Min assist. Fatigued with distance. Return to bed. Denied SOB or CP. Will f/u later for ed with his daughter.  [place], ACSM 02/02/2021 11:42 AM

## 2021-02-02 NOTE — Progress Notes (Addendum)
Inpatient Diabetes Program Recommendations  AACE/ADA: New Consensus Statement on Inpatient Glycemic Control (2015)  Target Ranges:  Prepandial:   less than 140 mg/dL      Peak postprandial:   less than 180 mg/dL (1-2 hours)      Critically ill patients:  140 - 180 mg/dL   Lab Results  Component Value Date   GLUCAP 288 (H) 02/02/2021   HGBA1C 7.0 (H) 02/01/2021    Review of Glycemic Control Results for Frank Phillips, Frank Phillips (MRN [id number]) as of 02/02/2021 13:39  Ref. Range 02/02/2021 06:44 02/02/2021 11:13  Glucose-Capillary Latest Ref Range: 70 - 99 mg/dL 149 (H) 288 (H)   Diabetes history: DM2 Outpatient Diabetes medications: Metformin 500 mg BID, Glipizide 10 mg BID Current orders for Inpatient glycemic control: Novolog 0-9 units and 0-5 units QHS  Inpatient Diabetes Program Recommendations:     Novolog 2-3 units TID if eats at least 50%. Please add carb modified diet.  Will continue to follow while inpatient.  Thank you, Reche Dixon, RN, BSN Diabetes Coordinator Inpatient Diabetes Program [phone] (team pager from 8a-5p)

## 2021-02-02 NOTE — Progress Notes (Signed)
Progress Note  Patient Name: Frank Phillips Date of Encounter: 02/02/2021  [facility] Cardiologist: No primary care provider on file.   Subjective   Doing well this morning. Chest pain free. States he wants to go home. Remains on nitro for HTN (not chest pain)  LHC on 02/01/21 demonstrated 95% in-stent restenosis of proximal RCA and CTO of the mid RCA. Attempted to cross lesion with guidewire but was unsuccessful. Intervention was complicated by proximal RCA dissection with subsequent chest pain that improved with NTG.  Cr 0.8    Inpatient Medications    Scheduled Meds: . aspirin EC  81 mg Oral Daily  . atorvastatin  40 mg Oral q1800  . carvedilol  6.25 mg Oral BID WC  . Chlorhexidine Gluconate Cloth  6 each Topical Daily  . clopidogrel  75 mg Oral Q breakfast  . DULoxetine  30 mg Oral BID  . enoxaparin (LOVENOX) injection  40 mg Subcutaneous Q24H  . insulin aspart  0-5 Units Subcutaneous QHS  . insulin aspart  0-9 Units Subcutaneous TID WC  . magnesium oxide  400 mg Oral QHS  . mesalamine  1.5 g Oral Daily  . pantoprazole  40 mg Oral Daily  . sodium chloride flush  3 mL Intravenous Q12H  . sodium chloride flush  3 mL Intravenous Q12H  . tamsulosin  0.4 mg Oral Daily  . temazepam  30 mg Oral QHS   Continuous Infusions: . sodium chloride    . nitroGLYCERIN 55 mcg/min (02/02/21 0602)   PRN Meds: sodium chloride, acetaminophen, albuterol, nitroGLYCERIN, ondansetron (ZOFRAN) IV, sodium chloride flush   Vital Signs    Vitals:   02/02/21 0100 02/02/21 0331 02/02/21 0400 02/02/21 0500  BP: (!) 165/76  (!) 143/86 (!) 159/79  Pulse: (!) 54  (!) 55 60  Resp: 19  (!) 24 (!) 21  Temp:  98.3 F (36.8 C)    TempSrc:  Oral    SpO2: 94%  91% 96%  Weight:      Height:        Intake/Output Summary (Last 24 hours) at 02/02/2021 0724 Last data filed at 02/02/2021 0500 Gross per 24 hour  Intake 385.67 ml  Output 600 ml  Net -214.33 ml   Last 3 Weights 02/01/2021 05/05/2019  06/06/2018  Weight (lbs) 172 lb 12.8 oz 160 lb 4.4 oz 161 lb 9.6 oz  Weight (kg) 78.382 kg 72.7 kg 73.3 kg      Telemetry    NSR with frequent PVCs - Personally Reviewed  ECG    NSR, RBBB, PVCs- Personally Reviewed  Physical Exam   GEN: No acute distress.   Neck: No JVD Cardiac: RRR, no murmurs, rubs, or gallops.  Respiratory: Clear to auscultation bilaterally. GI: Soft, nontender, non-distended  MS: No edema; No deformity. Right radial cath site c/d/i. Neuro:  Nonfocal  Psych: Normal affect   Labs    High Sensitivity Troponin:  No results for input(s): TROPONINIHS in the last 720 hours.    Chemistry Recent Labs  Lab 02/01/21 1328 02/02/21 0034  NA 138 135  K 3.8 3.7  CL 105 104  CO2 23 22  GLUCOSE 194* 168*  BUN 11 9  CREATININE 0.86 0.80  CALCIUM 9.0 8.6*  PROT 6.0*  --   ALBUMIN 3.2*  --   AST 33  --   ALT 20  --   ALKPHOS 67  --   BILITOT 0.7  --   GFRNONAA >60 >60  ANIONGAP 10  9     Hematology Recent Labs  Lab 02/01/21 1328 02/02/21 0034  WBC 9.8 8.9  RBC 4.01* 3.87*  HGB 10.6* 10.1*  HCT 33.0* 32.4*  MCV 82.3 83.7  MCH 26.4 26.1  MCHC 32.1 31.2  RDW 15.5 15.8*  PLT 188 178    BNPNo results for input(s): BNP, PROBNP in the last 168 hours.   DDimer No results for input(s): DDIMER in the last 168 hours.   Radiology    CARDIAC CATHETERIZATION  Result Date: 02/02/2021 Conclusions: 1. Severe single-vessel coronary artery disease with 95% in-stent restenosis of proximal RCA and chronic total occlusion of the mid RCA distal to takeoff of RV marginal.  The distal vessel fills via faint left-to-right collaterals. 2. Moderate diffuse LAD and LCx disease of up to 60-70%.  Small branch of OM1 has 99% ostial stenosis but is too small/distal for intervention. 3. Normal left ventricular systolic function and filling pressure. 4. Unsuccessful attempted PCI of proximal/mid RCA occlusion with failure to cross lesion with guidewire.  Intervention was  complicated by proximal RCA dissection. Recommendations: 1. Dual antiplatelet therapy with aspirin and clopidogrel for at least 12 months. 2. Aggressive secondary prevention of coronary artery disease. 3. Titrate nitroglycerin for continued relief of chest pain as well as for blood pressure control. 4. If the patient has refractory angina, could consider PCI to OM1/OM2, though these lesions do not appear critical. Nelva Bush, MD [facility]    Cardiac Studies   Cath 02/01/21: Conclusions: 1. Severe single-vessel coronary artery disease with 95% in-stent restenosis of proximal RCA and chronic total occlusion of the mid RCA distal to takeoff of RV marginal.  The distal vessel fills via faint left-to-right collaterals. 2. Moderate diffuse LAD and LCx disease of up to 60-70%.  Small branch of OM1 has 99% ostial stenosis but is too small/distal for intervention. 3. Normal left ventricular systolic function and filling pressure. 4. Unsuccessful attempted PCI of proximal/mid RCA occlusion with failure to cross lesion with guidewire.  Intervention was complicated by proximal RCA dissection.  Recommendations: 1. Dual antiplatelet therapy with aspirin and clopidogrel for at least 12 months. 2. Aggressive secondary prevention of coronary artery disease. 3. Titrate nitroglycerin for continued relief of chest pain as well as for blood pressure control. 4. If the patient has refractory angina, could consider PCI to OM1/OM2, though these lesions do not appear critical.  TTE 2017: Study Conclusions  - Left ventricle: The cavity size was normal. There was moderate  focal basal hypertrophy. Systolic function was vigorous. The  estimated ejection fraction was in the range of 65% to 70%. Wall  motion was normal; there were no regional wall motion  abnormalities. Features are consistent with a pseudonormal left  ventricular filling pattern, with concomitant abnormal relaxation  and increased  filling pressure (grade 2 diastolic dysfunction).  - Aortic valve: Moderately calcified annulus. Trileaflet; mildly  thickened, mildly calcified leaflets.  - Mitral valve: Minimal focal calcification of the anterior leaflet  (medial segment(s)). There was trivial regurgitation.  - Tricuspid valve: There was trivial regurgitation.  - Pulmonic valve: There was mild regurgitation.  - Pulmonary arteries: PA peak pressure: 31 mm Hg (S).   TTE 06/02/20 (OSH): echo 06/02/20 borderline concentric LVH, EF 55-60%, mild MR, mild PR, trace TR  TTE 01/30/21 EF 55-60%, impaired relaxation  Basal in, and inferoseptal LV wall motion is hypokinetic.   Patient Profile     75 y.o. male with history of SVT, NSVT, syncope, orhtostatic hypotension, CVA, HTN,  HLD, DM-2 OSA, anxiety, memory loss, GERD, chronic pain and nephrolithiasis who initially presented to [facility] with chest pain found to have elevated troponin and inferior wall hypokinesis on TTE consistent with NSTEMI prompting transfer to [facility] hospital. Underwent LHC on 02/01/21 where he was found to have 95% in-stent restenosis of proximal RCA and CTO of the mid RCA. Attempted to cross lesion with guidewire but was unsuccessful. Intervention was complicated by proximal RCA dissection with subsequent chest pain that improved with NTG.   Assessment & Plan    #NSTEMI: #Coronary Artery Disease: Patient presented with chest pain found to have NSTEMI with troponin elevation and inferior wall hypokinesis on TTE prompting transfer to [facility] hospital. Underwent LHC on 02/01/21 which showed severe in-stent restenosis of prox RCA with CTO of mid-to-distal RCA. Attempted to cross lesion but was unsuccessful. Cath complicated by proximal RCA dissection with subsequent chest pain which improved with SL-NTG. -Continue ASA and plavix 7m daily for 12 months -Wean nitro gtt as able and start imdur 341mdaily -Continue lipitor 4020maily -Increase coreg to 12.5mg47mD -Start  losartan 50mg6mly -Will need cardiac rehab on discharge  #HTN Emergency: Patient with severely elevated blood pressures on arrival to [facility] improved with nitro gtt. Still requiring nitro gtt for blood pressure control this AM -Wean nitro gtt today -Increase coreg to 12.5mg B74m-Start losartan 50mg d24m -Start imdur 30mg da41m #Dementia: -Continue aricept  #DMII: -Continue ISS while in-patient  #GERD: -Continue protonix  Okay to transfer out of ICU. Plan discussed with the patient and the family at length today.  CRITICAL CARE TIME: I have spent a total of 40 minutes with patient reviewing hospital notes, telemetry, EKGs, labs and examining the patient as well as establishing an assessment and plan that was discussed with the patient.  > 50% of time was spent in direct patient care. The patient is critically ill with multi-organ system failure and requires high complexity decision making for assessment and support, frequent evaluation and titration of therapies, application of advanced monitoring technologies and extensive interpretation of multiple databases.   For questions or updates, please contact [facility] [email or web address] for contact info under        Signed, Kamorie Aldous Freada Bergeron5/2022, 7:24 AM

## 2021-02-03 DIAGNOSIS — I214 Non-ST elevation (NSTEMI) myocardial infarction: Secondary | ICD-10-CM | POA: Diagnosis not present

## 2021-02-03 LAB — GLUCOSE, CAPILLARY
Glucose-Capillary: 201 mg/dL — ABNORMAL HIGH (ref 70–99)
Glucose-Capillary: 273 mg/dL — ABNORMAL HIGH (ref 70–99)
Glucose-Capillary: 239 mg/dL — ABNORMAL HIGH (ref 70–99)
Glucose-Capillary: 257 mg/dL — ABNORMAL HIGH (ref 70–99)

## 2021-02-03 LAB — BASIC METABOLIC PANEL
Anion gap: 10 (ref 5–15)
BUN: 9 mg/dL (ref 8–23)
CO2: 25 mmol/L (ref 22–32)
Calcium: 9.2 mg/dL (ref 8.9–10.3)
Chloride: 102 mmol/L (ref 98–111)
Creatinine, Ser: 0.83 mg/dL (ref 0.61–1.24)
GFR, Estimated: >60 mL/min (ref >60)
Glucose, Bld: 257 mg/dL — ABNORMAL HIGH (ref 70–99)
Potassium: 3.7 mmol/L (ref 3.5–5.1)
Sodium: 137 mmol/L (ref 135–145)

## 2021-02-03 MED ORDER — MESALAMINE ER 0.375 G PO CP24
0.3750 g | ORAL_CAPSULE | Freq: Four times a day (QID) | ORAL | Status: DC
  Administered 2021-02-03 (×4): 0.375 g via ORAL
  Filled 2021-02-03 (×5): qty 1

## 2021-02-03 MED ORDER — ISOSORBIDE MONONITRATE ER 60 MG PO TB24
60.0000 mg | ORAL_TABLET | Freq: Every day | ORAL | Status: DC
  Administered 2021-02-03 – 2021-02-04 (×2): 60 mg via ORAL
  Filled 2021-02-03 (×2): qty 1

## 2021-02-03 NOTE — Evaluation (Signed)
Occupational Therapy Evaluation Patient Details Name: Frank Phillips MRN: [id number] DOB: 1946/05/07 Today's Date: 02/03/2021    History of Present Illness Pt is a 75 y/o male admitted 4/4 secondary to NSTEMI. Pt is s/p heart cath on 4/4. PMH includes HTN and DM.   Clinical Impression   Pt PTA: Pt was living alone and unable to care for self and fix food to eat. Pt currently, limited by lethargy upon arrival, decreased strength, decreased activity tolerance and dereased ability to care for self.  Pt's daughter in room for session. Pt ambulating to sink and to recliner ~30' with poor endurace for minimal exertion. Pt set-upA to modA for ADL as pt fatigues easily. Pt's SpO2 >90% on RA, HR remained in 80s with exertion. Pt unable to properly care for self at home and requires 24/7 assist to regain strength and assist for managing medications. Pt would benefit from continued OT skilled services. OT following acutely.     Follow Up Recommendations  SNF    Equipment Recommendations  3 in 1 bedside commode    Recommendations for Other Services       Precautions / Restrictions Precautions Precautions: Fall Precaution Comments: Pt's daughter reports falls at home Restrictions Weight Bearing Restrictions: No      Mobility Bed Mobility Overal bed mobility: Needs Assistance Bed Mobility: Supine to Sit;Sit to Supine     Supine to sit: Supervision;HOB elevated Sit to supine: Supervision   General bed mobility comments: use of bed rails, increase time and effort to EOB    Transfers Overall transfer level: Needs assistance Equipment used: None Transfers: Sit to/from [facility] Sit to Stand: Min guard Stand pivot transfers: Min assist       General transfer comment: MinA for stability with transfer.    Balance Overall balance assessment: Needs assistance Sitting-balance support: No upper extremity supported;Feet supported Sitting balance-Leahy Scale: Fair      Standing balance support: No upper extremity supported;During functional activity Standing balance-Leahy Scale: Poor Standing balance comment: reliant on external support                           ADL either performed or assessed with clinical judgement   ADL Overall ADL's : Needs assistance/impaired Eating/Feeding: Set up;Sitting   Grooming: Min guard;Standing Grooming Details (indicate cue type and reason): stood for `1 minute for washing hands at sink Upper Body Bathing: Minimal assistance;Sitting   Lower Body Bathing: Moderate assistance;Sitting/lateral leans;Sit to/from stand   Upper Body Dressing : Minimal assistance;Sitting   Lower Body Dressing: Moderate assistance;Sitting/lateral leans;Sit to/from stand;Cueing for safety   Toilet Transfer: Minimal assistance;Ambulation;Cueing for safety   Toileting- Clothing Manipulation and Hygiene: Moderate assistance;Cueing for safety;Sit to/from stand;Sitting/lateral lean Toileting - Clothing Manipulation Details (indicate cue type and reason): difficulty reaching behind self     Functional mobility during ADLs: Min guard;Cueing for safety General ADL Comments: Pt limited by lethargy upon arrival, decreased strength, decreased activity tolerance and dereased ability to care for self.     Vision Baseline Vision/History: No visual deficits Patient Visual Report: No change from baseline Vision Assessment?: No apparent visual deficits     Perception     Praxis      Pertinent Vitals/Pain Pain Assessment: No/denies pain     Hand Dominance Right   Extremity/Trunk Assessment Upper Extremity Assessment Upper Extremity Assessment: Generalized weakness   Lower Extremity Assessment Lower Extremity Assessment: Generalized weakness   Cervical / Trunk Assessment Cervical /  Trunk Assessment: Kyphotic   Communication Communication Communication: HOH   Cognition Arousal/Alertness: Awake/alert Behavior During Therapy:  Flat affect Overall Cognitive Status: Impaired/Different from baseline Area of Impairment: Memory;Problem solving;Safety/judgement                     Memory: Decreased short-term memory;Decreased recall of precautions   Safety/Judgement: Decreased awareness of safety;Decreased awareness of deficits   Problem Solving: Slow processing     General Comments  Pt's SpO2 >90% on RA, HR remained in 80s with exertion. Pt's daughter in room for session. Pt ambulating to sink and to recliner ~30' with poor endurace for minimal exertion.    Exercises     Shoulder Instructions      Home Living Family/patient expects to be discharged to:: Skilled nursing facility                                 Additional Comments: Pt's daughter reports looking for rehab as pt is having increased difficulty with tasks at home.      Prior Functioning/Environment Level of Independence: Needs assistance  Gait / Transfers Assistance Needed: Pt's daughter reports pt has had falls at home, but does not use AD. ADL's / Homemaking Assistance Needed: Pt's daughter reports prior to patient staying with her, pt was not fixing meals, and not eating. Was having difficulty caring for himself.            OT Problem List: Decreased strength;Decreased activity tolerance;Impaired balance (sitting and/or standing);Decreased safety awareness;Cardiopulmonary status limiting activity;Increased edema;Decreased knowledge of use of DME or AE      OT Treatment/Interventions: Self-care/ADL training;Therapeutic exercise;Energy conservation;Therapeutic activities;Balance training;Patient/family education;Cognitive remediation/compensation;DME and/or AE instruction    OT Goals(Current goals can be found in the care plan section) Acute Rehab OT Goals Patient Stated Goal: to go to rehab according to pt's daughter OT Goal Formulation: With patient Time For Goal Achievement: 02/17/21 Potential to Achieve Goals:  Good ADL Goals Pt Will Perform Lower Body Dressing: with supervision;sit to/from stand Pt Will Transfer to Toilet: with supervision;ambulating;regular height toilet Additional ADL Goal #1: Pt will perform x6 mins of OOB ADL with 1 seated rest break and minguardA overall for stability. Additional ADL Goal #2: Pt will increase to supervisionA for all ADL functional transfers.  OT Frequency: Min 2X/week   Barriers to D/C:            Co-evaluation              AM-PAC OT "6 Clicks" Daily Activity     Outcome Measure Help from another person eating meals?: None Help from another person taking care of personal grooming?: A Little Help from another person toileting, which includes using toliet, bedpan, or urinal?: A Lot Help from another person bathing (including washing, rinsing, drying)?: A Lot Help from another person to put on and taking off regular upper body clothing?: A Little Help from another person to put on and taking off regular lower body clothing?: A Lot 6 Click Score: 16   End of Session Equipment Utilized During Treatment: Gait belt Nurse Communication: Mobility status  Activity Tolerance: Patient tolerated treatment well;Patient limited by fatigue Patient left: in chair;with call bell/phone within reach;with family/visitor present  OT Visit Diagnosis: Unsteadiness on feet (R26.81);Muscle weakness (generalized) (M62.81)                Time: 1630-1700 OT Time Calculation (min): 30 min Charges:  OT General Charges $OT Visit: 1 Visit OT Evaluation $OT Eval Moderate Complexity: 1 Mod OT Treatments $Self Care/Home Management : 8-22 mins  Jefferey Pica, OTR/L Acute Rehabilitation Services Pager: [phone] Office: [place] 02/03/2021, 5:59 PM

## 2021-02-03 NOTE — Progress Notes (Signed)
Inpatient Diabetes Program Recommendations  AACE/ADA: New Consensus Statement on Inpatient Glycemic Control (2015)  Target Ranges:  Prepandial:   less than 140 mg/dL      Peak postprandial:   less than 180 mg/dL (1-2 hours)      Critically ill patients:  140 - 180 mg/dL   Lab Results  Component Value Date   GLUCAP 273 (H) 02/03/2021   HGBA1C 7.0 (H) 02/01/2021    Review of Glycemic Control Results for Frank Phillips, Frank Phillips (MRN [id number]) as of 02/03/2021 13:39  Ref. Range 02/02/2021 11:13 02/02/2021 15:22 02/02/2021 21:07 02/03/2021 06:00 02/03/2021 10:40  Glucose-Capillary Latest Ref Range: 70 - 99 mg/dL 288 (H) 236 (H) 286 (H) 201 (H) 273 (H)   Diabetes history: DM2 Outpatient Diabetes medications: Metformin 500 mg BID, Glipizide 10 mg BID Current orders for Inpatient glycemic control: Novolog 0-9 units and 0-5 units QHS  Inpatient Diabetes Program Recommendations:     Novolog 2-3 units TID if eats at least 50%. Please add carb modified diet.  Will continue to follow while inpatient.  Thank you, Reche Dixon, RN, BSN Diabetes Coordinator Inpatient Diabetes Program [phone] (team pager from 8a-5p)

## 2021-02-03 NOTE — Progress Notes (Signed)
Pt seen and evaluated by PT. Pt requiring up to min up to mod A for steadying during gait. Noted 2 LOB when turning this session. Pt's daughter reports she has been staying with him some, but he is alone at times. Recommending SNF level therapies secondary to current deficits and high fall risk. Formal note to follow.   Reuel Derby, PT, DPT  Acute Rehabilitation Services  Pager: [phone] Office: [phone]

## 2021-02-03 NOTE — Progress Notes (Signed)
Discussed some education with pt and daughter. Gave pt MI book and reviewed. Reinforced Plavix/ASA. Pt struggled to understand some aspects, stating "I don't know what ya'll are talking about". He is HOH as well. Daughter voiced understanding. She is concerned regarding his living situation as he has become unsafe at home alone (not eating or mobilizing, only taking some meds). PT came at end of our conversation. Did not give exercise guidelines. I will refer to [facility], they can eval appropriateness in the future (if someone can take him, he would do well with exercise).  [place], ACSM 12:04 PM 02/03/2021

## 2021-02-03 NOTE — Progress Notes (Signed)
Progress Note  Patient Name: Frank Phillips Date of Encounter: 02/03/2021  [facility] Cardiologist: No primary care provider on file.   Subjective   Feels well this morning. Denies chest pain. Asking to go home. Blood pressures better controlled.  Inpatient Medications    Scheduled Meds: . aspirin EC  81 mg Oral Daily  . atorvastatin  40 mg Oral q1800  . carvedilol  12.5 mg Oral BID WC  . Chlorhexidine Gluconate Cloth  6 each Topical Daily  . clopidogrel  75 mg Oral Q breakfast  . DULoxetine  30 mg Oral BID  . enoxaparin (LOVENOX) injection  40 mg Subcutaneous Q24H  . insulin aspart  0-5 Units Subcutaneous QHS  . insulin aspart  0-9 Units Subcutaneous TID WC  . isosorbide mononitrate  30 mg Oral Daily  . losartan  50 mg Oral Daily  . magnesium oxide  400 mg Oral QHS  . mesalamine  1.5 g Oral Daily  . pantoprazole  40 mg Oral Daily  . sodium chloride flush  3 mL Intravenous Q12H  . sodium chloride flush  3 mL Intravenous Q12H  . tamsulosin  0.4 mg Oral Daily  . temazepam  30 mg Oral QHS   Continuous Infusions: . sodium chloride    . nitroGLYCERIN Stopped (02/02/21 1448)   PRN Meds: sodium chloride, acetaminophen, albuterol, nitroGLYCERIN, ondansetron (ZOFRAN) IV, sodium chloride flush   Vital Signs    Vitals:   02/02/21 1900 02/02/21 2300 02/03/21 0300 02/03/21 0700  BP: (!) 123/54 135/71 130/63 132/66  Pulse: 70 74 76 75  Resp: (!) 21 20 16  (!) 23  Temp: 98.9 F (37.2 C) 98.6 F (37 C) 98.5 F (36.9 C)   TempSrc: Oral Oral Oral   SpO2: 95% 94% 91% 96%  Weight:      Height:        Intake/Output Summary (Last 24 hours) at 02/03/2021 0736 Last data filed at 02/03/2021 0093 Gross per 24 hour  Intake 110.42 ml  Output 1775 ml  Net -1664.58 ml   Last 3 Weights 02/01/2021 05/05/2019 06/06/2018  Weight (lbs) 172 lb 12.8 oz 160 lb 4.4 oz 161 lb 9.6 oz  Weight (kg) 78.382 kg 72.7 kg 73.3 kg      Telemetry    NSR - Personally Reviewed  ECG    No new tracing -  Personally Reviewed  Physical Exam   GEN: Elderly male, laying in bed, comfortable  Neck: No JVD Cardiac: RRR, 2/6 systolic murmur. No rubs, or gallops.  Respiratory: Clear to auscultation bilaterally. GI: Soft, nontender, non-distended  MS: No edema; No deformity. Neuro:  Nonfocal  Psych: Normal affect   Labs    High Sensitivity Troponin:  No results for input(s): TROPONINIHS in the last 720 hours.    Chemistry Recent Labs  Lab 02/01/21 1328 02/02/21 0034  NA 138 135  K 3.8 3.7  CL 105 104  CO2 23 22  GLUCOSE 194* 168*  BUN 11 9  CREATININE 0.86 0.80  CALCIUM 9.0 8.6*  PROT 6.0*  --   ALBUMIN 3.2*  --   AST 33  --   ALT 20  --   ALKPHOS 67  --   BILITOT 0.7  --   GFRNONAA >60 >60  ANIONGAP 10 9     Hematology Recent Labs  Lab 02/01/21 1328 02/02/21 0034  WBC 9.8 8.9  RBC 4.01* 3.87*  HGB 10.6* 10.1*  HCT 33.0* 32.4*  MCV 82.3 83.7  MCH 26.4  26.1  MCHC 32.1 31.2  RDW 15.5 15.8*  PLT 188 178    BNPNo results for input(s): BNP, PROBNP in the last 168 hours.   DDimer No results for input(s): DDIMER in the last 168 hours.   Radiology    CARDIAC CATHETERIZATION  Result Date: 02/02/2021 Conclusions: 1. Severe single-vessel coronary artery disease with 95% in-stent restenosis of proximal RCA and chronic total occlusion of the mid RCA distal to takeoff of RV marginal.  The distal vessel fills via faint left-to-right collaterals. 2. Moderate diffuse LAD and LCx disease of up to 60-70%.  Small branch of OM1 has 99% ostial stenosis but is too small/distal for intervention. 3. Normal left ventricular systolic function and filling pressure. 4. Unsuccessful attempted PCI of proximal/mid RCA occlusion with failure to cross lesion with guidewire.  Intervention was complicated by proximal RCA dissection. Recommendations: 1. Dual antiplatelet therapy with aspirin and clopidogrel for at least 12 months. 2. Aggressive secondary prevention of coronary artery disease. 3.  Titrate nitroglycerin for continued relief of chest pain as well as for blood pressure control. 4. If the patient has refractory angina, could consider PCI to OM1/OM2, though these lesions do not appear critical. Nelva Bush, MD [facility]    Cardiac Studies   Cath 02/01/21: Conclusions: 1. Severe single-vessel coronary artery disease with 95% in-stent restenosis of proximal RCA and chronic total occlusion of the mid RCA distal to takeoff of RV marginal. The distal vessel fills via faint left-to-right collaterals. 2. Moderate diffuse LAD and LCx disease of up to 60-70%. Small branch of OM1 has 99% ostial stenosis but is too small/distal for intervention. 3. Normal left ventricular systolic function and filling pressure. 4. Unsuccessful attempted PCI of proximal/mid RCA occlusion with failure to cross lesion with guidewire. Intervention was complicated by proximal RCA dissection.  Recommendations: 1. Dual antiplatelet therapy with aspirin and clopidogrel for at least 12 months. 2. Aggressive secondary prevention of coronary artery disease. 3. Titrate nitroglycerin for continued relief of chest pain as well as for blood pressure control. 4. If the patient has refractory angina, could consider PCI to OM1/OM2, though these lesions do not appear critical.  TTE 2017: Study Conclusions  - Left ventricle: The cavity size was normal. There was moderate  focal basal hypertrophy. Systolic function was vigorous. The  estimated ejection fraction was in the range of 65% to 70%. Wall  motion was normal; there were no regional wall motion  abnormalities. Features are consistent with a pseudonormal left  ventricular filling pattern, with concomitant abnormal relaxation  and increased filling pressure (grade 2 diastolic dysfunction).  - Aortic valve: Moderately calcified annulus. Trileaflet; mildly  thickened, mildly calcified leaflets.  - Mitral valve: Minimal focal calcification  of the anterior leaflet  (medial segment(s)). There was trivial regurgitation.  - Tricuspid valve: There was trivial regurgitation.  - Pulmonic valve: There was mild regurgitation.  - Pulmonary arteries: PA peak pressure: 31 mm Hg (S).   TTE 06/02/20 (OSH): echo 06/02/20 borderline concentric LVH, EF 55-60%, mild MR, mild PR, trace TR  TTE 01/30/21 EF 55-60%, impaired relaxation Basal in, and inferoseptal LV wall motion is hypokinetic.   Patient Profile     75 y.o. male with history of SVT, NSVT, syncope, orhtostatic hypotension, CVA, HTN, HLD, DM-2 OSA, anxiety, memory loss, GERD, chronic pain and nephrolithiasis who initially presented to [facility] with chest pain found to have elevated troponin and inferior wall hypokinesis on TTE consistent with NSTEMI prompting transfer to [facility] hospital. Underwent LHC on  02/01/21 where he was found to have 95% in-stent restenosis of proximal RCA and CTO of the mid RCA. Attempted to cross lesion with guidewire but was unsuccessful. Intervention was complicated by proximal RCA dissection with subsequent chest pain that improved with NTG.   Assessment & Plan    #NSTEMI: #Coronary Artery Disease: Patient presented with chest pain found to have NSTEMI with troponin elevation and inferior wall hypokinesis on TTE prompting transfer to [facility] hospital. Underwent LHC on 02/01/21 which showed severe in-stent restenosis of prox RCA with CTO of mid-to-distal RCA. Attempted to cross lesion but was unsuccessful. Cath complicated by proximal RCA dissection with subsequent chest pain which improved with SL-NTG. -Continue ASA and plavix 62m daily for 12 months -Increase imdur to 682mdaily -Continue lipitor 4049maily -Continue coreg 12.5mg55mD -Continue losartan 50mg63mly -Cardiac rehab on discharge  #HTN Emergency: Patient with severely elevated blood pressures on arrival to [facility] improved with nitro gtt. Much better controlled today. -Off nitro  gtt -Continue coreg 12.5mg B33m-Continue losartan 50mg d19m -Increase imdur to 60mg da45m #Dementia: -Continue aricept  #DMII: -Continue ISS while in-patient  #GERD: -Continue protonix  Okay to discharge home today on current medications.  For questions or updates, please contact [email or web address] [email or web address] for contact info under        Signed, Kalijah Westfall Freada Bergeron6/2022, 7:36 AM

## 2021-02-03 NOTE — NC FL2 (Signed)
[place] LEVEL OF CARE SCREENING TOOL     IDENTIFICATION  Patient Name: Frank Phillips Birthdate: 1945/11/24 Sex: male Admission Date (Current Location): 02/01/2021  [facility] and [place] Number:  [employer] and Address:  [facility], [place] [address], [place],  [address]      Provider Number: [id number]  Attending Physician Name and Address:  Freada Bergeron, MD  Relative Name and Phone Number:  Anderson Malta (daughter) [phone]    Current Level of Care: Hospital Recommended Level of Care: [place] Prior Approval Number:    Date Approved/Denied:   PASRR Number: Pending  Discharge Plan: SNF    Current Diagnoses: Patient Active Problem List   Diagnosis Date Noted  . NSTEMI (non-ST elevated myocardial infarction) ([place]) 02/01/2021  . Diabetes mellitus without complication ([place]) 25/42/7062  . UTI (urinary tract infection) 06/06/2018  . Stroke (cerebrum) ([place]) 06/06/2018  . Acute metabolic encephalopathy 37/62/8315  . GERD (gastroesophageal reflux disease) 06/06/2018  . Depression 06/06/2018  . Lung nodule 06/06/2018  . Dehydration 06/06/2018  . FTT (failure to thrive) in adult 06/06/2018  . Aortic atherosclerosis ([place]) 06/06/2018  . Orthostatic hypotension 04/06/2016  . Syncopal episodes 04/06/2016  . Bladder outlet obstruction 11/06/2013  . Dyspnea 09/17/2013  . Cough variant asthma 09/17/2013    Orientation RESPIRATION BLADDER Height & Weight     Time,Place,Self  Normal Continent Weight: 172 lb 12.8 oz (78.4 kg) Height:  5' 8"  (172.7 cm)  BEHAVIORAL SYMPTOMS/MOOD NEUROLOGICAL BOWEL NUTRITION STATUS      Continent Diet (See DC summary)  AMBULATORY STATUS COMMUNICATION OF NEEDS Skin   Extensive Assist Verbally Other (Comment) (Necrotic diabetic ulcer on right toe,)                       Personal Care Assistance Level of Assistance  Bathing,Feeding,Dressing Bathing Assistance: Maximum  assistance Feeding assistance: Independent Dressing Assistance: Maximum assistance     Functional Limitations Info  Sight,Hearing,Speech Sight Info: Impaired Hearing Info: Impaired Speech Info: Adequate    SPECIAL CARE FACTORS FREQUENCY  PT (By licensed PT),OT (By licensed OT)     PT Frequency: 5X per week OT Frequency: 5X per week            Contractures      Additional Factors Info  Code Status,Allergies,Psychotropic,Insulin Sliding Scale Code Status Info: FULL Allergies Info: Levaquin (levofloxacin) Psychotropic Info: Cymbalta Insulin Sliding Scale Info: See DC summary       Current Medications (02/03/2021):  This is the current hospital active medication list Current Facility-Administered Medications  Medication Dose Route Frequency Provider Last Rate Last Admin  . 0.9 %  sodium chloride infusion  250 mL Intravenous PRN End, Harrell Gave, MD      . acetaminophen (TYLENOL) tablet 650 mg  650 mg Oral Q4H PRN End, Harrell Gave, MD   650 mg at 02/02/21 0840  . albuterol (VENTOLIN HFA) 108 (90 Base) MCG/ACT inhaler 2 puff  2 puff Inhalation Q6H PRN End, Christopher, MD      . aspirin EC tablet 81 mg  81 mg Oral Daily End, Christopher, MD   81 mg at 02/03/21 0940  . atorvastatin (LIPITOR) tablet 40 mg  40 mg Oral q1800 End, Christopher, MD   40 mg at 02/02/21 1717  . carvedilol (COREG) tablet 12.5 mg  12.5 mg Oral BID WC Freada Bergeron, MD   12.5 mg at 02/03/21 0801  . Chlorhexidine Gluconate Cloth 2 %  PADS 6 each  6 each Topical Daily Freada Bergeron, MD   6 each at 02/02/21 2000  . clopidogrel (PLAVIX) tablet 75 mg  75 mg Oral Q breakfast End, Christopher, MD   75 mg at 02/03/21 0801  . DULoxetine (CYMBALTA) DR capsule 30 mg  30 mg Oral BID End, Christopher, MD   30 mg at 02/03/21 0940  . enoxaparin (LOVENOX) injection 40 mg  40 mg Subcutaneous Q24H End, Christopher, MD   40 mg at 02/03/21 0801  . insulin aspart (novoLOG) injection 0-5 Units  0-5 Units  Subcutaneous QHS End, Christopher, MD   3 Units at 02/02/21 2203  . insulin aspart (novoLOG) injection 0-9 Units  0-9 Units Subcutaneous TID WC End, Christopher, MD   5 Units at 02/03/21 1218  . isosorbide mononitrate (IMDUR) 24 hr tablet 60 mg  60 mg Oral Daily Freada Bergeron, MD   60 mg at 02/03/21 0940  . losartan (COZAAR) tablet 50 mg  50 mg Oral Daily Freada Bergeron, MD   50 mg at 02/03/21 0940  . magnesium oxide (MAG-OX) tablet 400 mg  400 mg Oral QHS End, Christopher, MD   400 mg at 02/02/21 2200  . mesalamine (APRISO) 24 hr capsule 0.375 g  0.375 g Oral QID Freada Bergeron, MD   0.375 g at 02/03/21 1359  . nitroGLYCERIN (NITROSTAT) SL tablet 0.4 mg  0.4 mg Sublingual Q5 Min x 3 PRN End, Harrell Gave, MD      . ondansetron (ZOFRAN) injection 4 mg  4 mg Intravenous Q6H PRN End, Christopher, MD      . pantoprazole (PROTONIX) EC tablet 40 mg  40 mg Oral Daily End, Christopher, MD   40 mg at 02/03/21 0940  . sodium chloride flush (NS) 0.9 % injection 3 mL  3 mL Intravenous Q12H End, Christopher, MD   3 mL at 02/03/21 0940  . sodium chloride flush (NS) 0.9 % injection 3 mL  3 mL Intravenous Q12H End, Christopher, MD   3 mL at 02/03/21 0940  . sodium chloride flush (NS) 0.9 % injection 3 mL  3 mL Intravenous PRN End, Harrell Gave, MD      . tamsulosin (FLOMAX) capsule 0.4 mg  0.4 mg Oral Daily End, Christopher, MD   0.4 mg at 02/03/21 0940  . temazepam (RESTORIL) capsule 30 mg  30 mg Oral QHS End, Christopher, MD   30 mg at 02/02/21 2159     Discharge Medications: Please see discharge summary for a list of discharge medications.  Relevant Imaging Results:  Relevant Lab Results:   Additional Information SSN: [id number]  Reece Agar, [place]

## 2021-02-03 NOTE — Progress Notes (Signed)
   Spoke with PT over the phone who recommends SNF placement due to patient's unsteadiness. Placed order for SW to assist with SNF placement. Will follow-up on availability for further discharge planning.   Abigail Butts, PA-C 02/03/21; 12:21 PM

## 2021-02-03 NOTE — Evaluation (Signed)
Physical Therapy Evaluation Patient Details Name: Frank Phillips MRN: [id number] DOB: 10/28/1946 Today's Date: 02/03/2021   History of Present Illness  Pt is a 75 y/o male admitted 4/4 secondary to NSTEMI. Pt is s/p heart cath on 4/4. PMH includes HTN and DM.  Clinical Impression  Pt admitted secondary to problem above with deficits below. Pt requiring min A for transfers and up to mod A for gait. Pt with LOB X 2 when turning and presenting with short shuffled steps. Per daughter, pt has had falls at home. Recommending SNF level therapies at d/c to increase independence and safety. Will continue to follow acutely.     Follow Up Recommendations SNF    Equipment Recommendations  None recommended by PT    Recommendations for Other Services       Precautions / Restrictions Precautions Precautions: Fall Precaution Comments: Pt's daughter reports falls at home Restrictions Weight Bearing Restrictions: No      Mobility  Bed Mobility Overal bed mobility: Needs Assistance Bed Mobility: Supine to Sit;Sit to Supine     Supine to sit: Supervision;HOB elevated Sit to supine: Supervision   General bed mobility comments: HEavy use of bed rails to sit up at EOB and increased time. No physical assist required.    Transfers Overall transfer level: Needs assistance Equipment used: None Transfers: Sit to/from Stand Sit to Stand: Min assist         General transfer comment: Min A for steadying assist to stand.  Ambulation/Gait Ambulation/Gait assistance: Min assist;Mod assist Gait Distance (Feet): 40 Feet Assistive device: None Gait Pattern/deviations: Step-through pattern;Decreased stride length;Staggering left Gait velocity: Decreased   General Gait Details: Unsteady gait. Short shuffled steps. Increased unsteadiness when turning and pt with LOB X2 requiring mod A for stability.  Stairs            Wheelchair Mobility    Modified Rankin (Stroke Patients Only)        Balance Overall balance assessment: Needs assistance Sitting-balance support: No upper extremity supported;Feet supported Sitting balance-Leahy Scale: Fair     Standing balance support: No upper extremity supported;During functional activity Standing balance-Leahy Scale: Poor Standing balance comment: reliant on external support                             Pertinent Vitals/Pain Pain Assessment: No/denies pain    Home Living Family/patient expects to be discharged to:: Skilled nursing facility                 Additional Comments: Pt's daughter reports looking for rehab as pt is having increased difficulty with tasks at home.    Prior Function Level of Independence: Needs assistance   Gait / Transfers Assistance Needed: Pt's daughter reports pt has had falls at home, but does not use AD.  ADL's / Homemaking Assistance Needed: Pt's daughter reports prior to patient staying with her, pt was not fixing meals, and not eating. Was having difficulty caring for himself.        Hand Dominance        Extremity/Trunk Assessment   Upper Extremity Assessment Upper Extremity Assessment: Defer to OT evaluation    Lower Extremity Assessment Lower Extremity Assessment: Generalized weakness    Cervical / Trunk Assessment Cervical / Trunk Assessment: Kyphotic  Communication   Communication: HOH  Cognition Arousal/Alertness: Awake/alert Behavior During Therapy: Flat affect Overall Cognitive Status: Impaired/Different from baseline Area of Impairment: Memory;Problem solving;Safety/judgement  Memory: Decreased short-term memory;Decreased recall of precautions   Safety/Judgement: Decreased awareness of safety;Decreased awareness of deficits   Problem Solving: Slow processing        General Comments      Exercises     Assessment/Plan    PT Assessment Patient needs continued PT services  PT Problem List Decreased  strength;Decreased balance;Decreased activity tolerance;Decreased mobility;Decreased cognition;Decreased knowledge of use of DME;Decreased safety awareness;Decreased knowledge of precautions       PT Treatment Interventions DME instruction;Gait training;Stair training;Functional mobility training;Therapeutic activities;Therapeutic exercise;Balance training;Patient/family education    PT Goals (Current goals can be found in the Care Plan section)  Acute Rehab PT Goals Patient Stated Goal: to go to rehab according to pt's daughter PT Goal Formulation: With patient/family Time For Goal Achievement: 02/17/21 Potential to Achieve Goals: Good    Frequency Min 2X/week   Barriers to discharge        Co-evaluation               AM-PAC PT "6 Clicks" Mobility  Outcome Measure Help needed turning from your back to your side while in a flat bed without using bedrails?: A Little Help needed moving from lying on your back to sitting on the side of a flat bed without using bedrails?: A Little Help needed moving to and from a bed to a chair (including a wheelchair)?: A Little Help needed standing up from a chair using your arms (e.g., wheelchair or bedside chair)?: A Little Help needed to walk in hospital room?: A Lot Help needed climbing 3-5 steps with a railing? : Total 6 Click Score: 15    End of Session Equipment Utilized During Treatment: Gait belt Activity Tolerance: Patient tolerated treatment well Patient left: in bed;with call bell/phone within reach;with bed alarm set;with family/visitor present Nurse Communication: Mobility status PT Visit Diagnosis: Unsteadiness on feet (R26.81);Muscle weakness (generalized) (M62.81)    Time: 1308-6578 PT Time Calculation (min) (ACUTE ONLY): 15 min   Charges:   PT Evaluation $PT Eval Moderate Complexity: 1 Mod          Reuel Derby, PT, DPT  Acute Rehabilitation Services  Pager: [phone] Office: [phone]   Rudean Hitt 02/03/2021, 2:25 PM

## 2021-02-03 NOTE — Discharge Instructions (Signed)

## 2021-02-03 NOTE — TOC Initial Note (Signed)
Transition of Care [facility]) - Initial/Assessment Note    Patient Details  Name: Frank Phillips MRN: [id number] Date of Birth: [birth year]  Transition of Care [facility]) CM/SW Contact:    Tresa Endo Phone Number: 02/03/2021, 4:32 PM  Clinical Narrative:                 11:00am- CSW spoke with pt daughter who has concerns about pysical ability to go home. Daughter would prefer [facility]. Pt has not yet put in SNF referral. CSW will follow up after pt makes referral. 11:30- Pt put in referral CSW will complete FL2 and fax out to [facility]. 4:30pm- CSW faxed initial referral out to [facility]. CSW will follow up        Patient Goals and CMS Choice        Expected Discharge Plan and Services                                                Prior Living Arrangements/Services                       Activities of Daily Living Home Assistive Devices/Equipment: Eyeglasses ADL Screening (condition at time of admission) Patient's cognitive ability adequate to safely complete daily activities?: Yes Is the patient deaf or have difficulty hearing?: Yes Does the patient have difficulty seeing, even when wearing glasses/contacts?: No Does the patient have difficulty concentrating, remembering, or making decisions?: Yes Patient able to express need for assistance with ADLs?: Yes Does the patient have difficulty dressing or bathing?: No Independently performs ADLs?: Yes (appropriate for developmental age) Does the patient have difficulty walking or climbing stairs?: Yes Weakness of Legs: Both Weakness of Arms/Hands: None  Permission Sought/Granted                  Emotional Assessment              Admission diagnosis:  NSTEMI (non-ST elevated myocardial infarction) [name]) [I21.4] Patient Active Problem List   Diagnosis Date Noted  . NSTEMI (non-ST elevated myocardial infarction) ([place]) 02/01/2021  . Diabetes mellitus without complication ([place]) 82/95/6213  . UTI  (urinary tract infection) 06/06/2018  . Stroke (cerebrum) ([place]) 06/06/2018  . Acute metabolic encephalopathy 08/65/7846  . GERD (gastroesophageal reflux disease) 06/06/2018  . Depression 06/06/2018  . Lung nodule 06/06/2018  . Dehydration 06/06/2018  . FTT (failure to thrive) in adult 06/06/2018  . Aortic atherosclerosis ([place]) 06/06/2018  . Orthostatic hypotension 04/06/2016  . Syncopal episodes 04/06/2016  . Bladder outlet obstruction 11/06/2013  . Dyspnea 09/17/2013  . Cough variant asthma 09/17/2013   PCP:  Myrlene Broker, MD Pharmacy:   [place], [place] [address] Phone: [phone] Fax: [phone]     Social Determinants of Health (SDOH) Interventions    Readmission Risk Interventions No flowsheet data found.

## 2021-02-03 NOTE — Plan of Care (Signed)
  Problem: Education: Goal: Knowledge of General Education information will improve Description: Including pain rating scale, medication(s)/side effects and non-pharmacologic comfort measures Outcome: Progressing   Problem: Health Behavior/Discharge Planning: Goal: Ability to manage health-related needs will improve Outcome: Progressing   Problem: Clinical Measurements: Goal: Ability to maintain clinical measurements within normal limits will improve Outcome: Progressing   Problem: Clinical Measurements: Goal: Cardiovascular complication will be avoided Outcome: Progressing   Problem: Activity: Goal: Risk for activity intolerance will decrease Outcome: Progressing   Problem: Nutrition: Goal: Adequate nutrition will be maintained Outcome: Progressing   Problem: Pain Managment: Goal: General experience of comfort will improve Outcome: Progressing   Problem: Safety: Goal: Ability to remain free from injury will improve Outcome: Progressing   Problem: Skin Integrity: Goal: Risk for impaired skin integrity will decrease Outcome: Progressing

## 2021-02-04 DIAGNOSIS — I214 Non-ST elevation (NSTEMI) myocardial infarction: Secondary | ICD-10-CM | POA: Diagnosis not present

## 2021-02-04 LAB — GLUCOSE, CAPILLARY
Glucose-Capillary: 232 mg/dL — ABNORMAL HIGH (ref 70–99)
Glucose-Capillary: 221 mg/dL — ABNORMAL HIGH (ref 70–99)
Glucose-Capillary: 297 mg/dL — ABNORMAL HIGH (ref 70–99)
Glucose-Capillary: 320 mg/dL — ABNORMAL HIGH (ref 70–99)

## 2021-02-04 MED ORDER — MESALAMINE ER 0.375 G PO CP24
1.5000 g | ORAL_CAPSULE | Freq: Every day | ORAL | Status: DC
  Administered 2021-02-04: 1.5 g via ORAL
  Filled 2021-02-04 (×2): qty 4

## 2021-02-04 MED ORDER — BISACODYL 5 MG PO TBEC: 10.0000 mg | DELAYED_RELEASE_TABLET | Freq: Every day | ORAL | Status: DC | PRN

## 2021-02-04 MED ORDER — DOCUSATE SODIUM 100 MG PO CAPS
100.0000 mg | ORAL_CAPSULE | Freq: Two times a day (BID) | ORAL | Status: DC
  Administered 2021-02-04: 100 mg via ORAL
  Filled 2021-02-04 (×2): qty 1

## 2021-02-04 MED ORDER — POLYETHYLENE GLYCOL 3350 17 G PO PACK
17.0000 g | PACK | Freq: Two times a day (BID) | ORAL | Status: DC
  Administered 2021-02-04: 17 g via ORAL
  Filled 2021-02-04 (×2): qty 1

## 2021-02-04 MED ORDER — INSULIN GLARGINE 100 UNIT/ML ~~LOC~~ SOLN
10.0000 [IU] | Freq: Every day | SUBCUTANEOUS | Status: DC
  Administered 2021-02-04: 10 [IU] via SUBCUTANEOUS
  Filled 2021-02-04 (×2): qty 0.1

## 2021-02-04 NOTE — Progress Notes (Signed)
CARDIAC REHAB PHASE I   PRE:  Rate/Rhythm: 68 SR with PACs    BP: sitting 100/48    SaO2: 94 RA  Pt asleep in bed. Arouses and sts he just wants to sleep. Repetitively encouraged him to ambulate but he continued to decline. Encouraged him to walk later. [place], ACSM 02/04/2021 1:33 PM

## 2021-02-04 NOTE — Plan of Care (Signed)
  Problem: Health Behavior/Discharge Planning: Goal: Ability to manage health-related needs will improve Outcome: Progressing   Problem: Clinical Measurements: Goal: Ability to maintain clinical measurements within normal limits will improve Outcome: Progressing   Problem: Clinical Measurements: Goal: Diagnostic test results will improve Outcome: Progressing   Problem: Clinical Measurements: Goal: Cardiovascular complication will be avoided Outcome: Progressing   Problem: Activity: Goal: Risk for activity intolerance will decrease Outcome: Progressing   Problem: Nutrition: Goal: Adequate nutrition will be maintained Outcome: Progressing   Problem: Pain Managment: Goal: General experience of comfort will improve Outcome: Progressing   Problem: Safety: Goal: Ability to remain free from injury will improve Outcome: Progressing   Problem: Skin Integrity: Goal: Risk for impaired skin integrity will decrease Outcome: Progressing

## 2021-02-04 NOTE — Care Management Important Message (Signed)
Important Message  Patient Details  Name: Frank Phillips MRN: [id number] Date of Birth: June 23, 1946   Medicare Important Message Given:  Yes     Anselm Aumiller Montine Circle 02/04/2021, 1:54 PM

## 2021-02-04 NOTE — TOC Progression Note (Signed)
Transition of Care [facility]) - Progression Note    Patient Details  Name: Frank Phillips MRN: [id number] Date of Birth: 15-Nov-1945  [facility]) CM/SW Contact  Reece Agar, [place] Phone Number: 02/04/2021, 1:31 PM  Clinical Narrative:    11:50am- Olivia Mackie from [place] confirmed approval for pt SNF, she said she is starting insurance auth for pt and will follow up with CSW when for final auth.  CSW spoke with pt daughter, she is agreeable to facility and awaiting DC.        Expected Discharge Plan and Services                                                 Social Determinants of Health (SDOH) Interventions    Readmission Risk Interventions No flowsheet data found.

## 2021-02-04 NOTE — Progress Notes (Signed)
Inpatient Diabetes Program Recommendations  AACE/ADA: New Consensus Statement on Inpatient Glycemic Control (2015)  Target Ranges:  Prepandial:   less than 140 mg/dL      Peak postprandial:   less than 180 mg/dL (1-2 hours)      Critically ill patients:  140 - 180 mg/dL   Lab Results  Component Value Date   GLUCAP 232 (H) 02/04/2021   HGBA1C 7.0 (H) 02/01/2021    Review of Glycemic Control Results for VIET, KEMMERER (MRN [id number]) as of 02/04/2021 11:09  Ref. Range 02/03/2021 06:00 02/03/2021 10:40 02/03/2021 15:43 02/03/2021 21:11 02/04/2021 06:27  Glucose-Capillary Latest Ref Range: 70 - 99 mg/dL 201 (H) 273 (H) 239 (H) 257 (H) 232 (H)   Diabetes history:  DM2 Outpatient Diabetes medications:  Metformin 500 mg BID, Glipizide 10 mg BID Current orders for Inpatient glycemic control:  Novolog 0-9 units TID and 0-5 units QHS  Inpatient Diabetes Program Recommendations:    Lantus 10 units daily  Will continue to follow while inpatient.  Thank you, Reche Dixon, RN, BSN Diabetes Coordinator Inpatient Diabetes Program [phone] (team pager from 8a-5p)

## 2021-02-04 NOTE — Plan of Care (Signed)

## 2021-02-04 NOTE — Progress Notes (Signed)
Progress Note  Patient Name: Frank Phillips Date of Encounter: 02/04/2021  [facility] Cardiologist: No primary care provider on file.   Subjective   Tired this morning. Wanting to leave the hospital. Denies chest pain or SOB.  Seen by PT yesterday who is recommending SNF at discharge Remains HD stable and chest pain free; blood pressure much better controlled  Inpatient Medications    Scheduled Meds: . aspirin EC  81 mg Oral Daily  . atorvastatin  40 mg Oral q1800  . carvedilol  12.5 mg Oral BID WC  . Chlorhexidine Gluconate Cloth  6 each Topical Daily  . clopidogrel  75 mg Oral Q breakfast  . DULoxetine  30 mg Oral BID  . enoxaparin (LOVENOX) injection  40 mg Subcutaneous Q24H  . insulin aspart  0-5 Units Subcutaneous QHS  . insulin aspart  0-9 Units Subcutaneous TID WC  . isosorbide mononitrate  60 mg Oral Daily  . losartan  50 mg Oral Daily  . magnesium oxide  400 mg Oral QHS  . mesalamine  0.375 g Oral QID  . pantoprazole  40 mg Oral Daily  . sodium chloride flush  3 mL Intravenous Q12H  . sodium chloride flush  3 mL Intravenous Q12H  . tamsulosin  0.4 mg Oral Daily  . temazepam  30 mg Oral QHS   Continuous Infusions: . sodium chloride     PRN Meds: sodium chloride, acetaminophen, albuterol, nitroGLYCERIN, ondansetron (ZOFRAN) IV, sodium chloride flush   Vital Signs    Vitals:   02/03/21 1713 02/03/21 1942 02/03/21 2325 02/04/21 0347  BP: 104/62 (!) 94/57 (!) 112/52 129/71  Pulse: 83 75 74 70  Resp:  20 20 19   Temp:  98.5 F (36.9 C) 98.9 F (37.2 C) 99.3 F (37.4 C)  TempSrc:  Oral Oral Oral  SpO2:  94% 94% 93%  Weight:      Height:        Intake/Output Summary (Last 24 hours) at 02/04/2021 0740 Last data filed at 02/03/2021 2200 Gross per 24 hour  Intake 440 ml  Output 220 ml  Net 220 ml   Last 3 Weights 02/01/2021 05/05/2019 06/06/2018  Weight (lbs) 172 lb 12.8 oz 160 lb 4.4 oz 161 lb 9.6 oz  Weight (kg) 78.382 kg 72.7 kg 73.3 kg      Telemetry     NSR; occasional PACs/SVE - Personally Reviewed  ECG    No new tracing - Personally Reviewed  Physical Exam   GEN: Elderly male, NAD  Neck: No JVD Cardiac: RRR, no murmurs, rubs, or gallops.  Respiratory: Clear to auscultation bilaterally. GI: Soft, nontender, non-distended  MS: No edema; No deformity. Neuro:  Nonfocal  Psych: Flat affect  Labs    High Sensitivity Troponin:  No results for input(s): TROPONINIHS in the last 720 hours.    Chemistry Recent Labs  Lab 02/01/21 1328 02/02/21 0034 02/03/21 0839  NA 138 135 137  K 3.8 3.7 3.7  CL 105 104 102  CO2 23 22 25   GLUCOSE 194* 168* 257*  BUN 11 9 9   CREATININE 0.86 0.80 0.83  CALCIUM 9.0 8.6* 9.2  PROT 6.0*  --   --   ALBUMIN 3.2*  --   --   AST 33  --   --   ALT 20  --   --   ALKPHOS 67  --   --   BILITOT 0.7  --   --   GFRNONAA >60 >60 >60  ANIONGAP 10 9 10      Hematology Recent Labs  Lab 02/01/21 1328 02/02/21 0034  WBC 9.8 8.9  RBC 4.01* 3.87*  HGB 10.6* 10.1*  HCT 33.0* 32.4*  MCV 82.3 83.7  MCH 26.4 26.1  MCHC 32.1 31.2  RDW 15.5 15.8*  PLT 188 178    BNPNo results for input(s): BNP, PROBNP in the last 168 hours.   DDimer No results for input(s): DDIMER in the last 168 hours.   Radiology    No results found.  Cardiac Studies   Cath 02/01/21: Conclusions: 1. Severe single-vessel coronary artery disease with 95% in-stent restenosis of proximal RCA and chronic total occlusion of the mid RCA distal to takeoff of RV marginal. The distal vessel fills via faint left-to-right collaterals. 2. Moderate diffuse LAD and LCx disease of up to 60-70%. Small branch of OM1 has 99% ostial stenosis but is too small/distal for intervention. 3. Normal left ventricular systolic function and filling pressure. 4. Unsuccessful attempted PCI of proximal/mid RCA occlusion with failure to cross lesion with guidewire. Intervention was complicated by proximal RCA dissection.  Recommendations: 1. Dual  antiplatelet therapy with aspirin and clopidogrel for at least 12 months. 2. Aggressive secondary prevention of coronary artery disease. 3. Titrate nitroglycerin for continued relief of chest pain as well as for blood pressure control. 4. If the patient has refractory angina, could consider PCI to OM1/OM2, though these lesions do not appear critical.  TTE 2017: Study Conclusions  - Left ventricle: The cavity size was normal. There was moderate  focal basal hypertrophy. Systolic function was vigorous. The  estimated ejection fraction was in the range of 65% to 70%. Wall  motion was normal; there were no regional wall motion  abnormalities. Features are consistent with a pseudonormal left  ventricular filling pattern, with concomitant abnormal relaxation  and increased filling pressure (grade 2 diastolic dysfunction).  - Aortic valve: Moderately calcified annulus. Trileaflet; mildly  thickened, mildly calcified leaflets.  - Mitral valve: Minimal focal calcification of the anterior leaflet  (medial segment(s)). There was trivial regurgitation.  - Tricuspid valve: There was trivial regurgitation.  - Pulmonic valve: There was mild regurgitation.  - Pulmonary arteries: PA peak pressure: 31 mm Hg (S).   TTE 06/02/20 (OSH):echo 06/02/20 borderline concentric LVH, EF 55-60%, mild MR, mild PR, trace TR  TTE4/2/22 EF 55-60%, impaired relaxation Basal in, and inferoseptal LV wall motion is hypokinetic.  Patient Profile     75 y.o. male with history ofSVT, NSVT, syncope, orhtostatic hypotension, CVA, HTN, HLD, DM-2 OSA, anxiety, memory loss, GERD, chronic pain and nephrolithiasis who initially presented to [facility] with chest pain found to have elevated troponin and inferior wall hypokinesis on TTE consistent with NSTEMI prompting transfer to [facility] hospital. Underwent LHC on 02/01/21 where he was found to have 95% in-stent restenosis of proximal RCA and CTO of the mid RCA. Attempted to  cross lesion with guidewire but was unsuccessful. Intervention was complicated by proximal RCA dissection with subsequent chest pain that improved with NTG.Now on medical management. Awaiting discharge to SNF.  Assessment & Plan     #NSTEMI: #Coronary Artery Disease: Patient presented with chest pain found to have NSTEMI with troponin elevation and inferior wall hypokinesis on TTE prompting transfer to [facility] hospital. Underwent LHC on 02/01/21 which showed severe in-stent restenosis of prox RCA with CTO of mid-to-distal RCA. Attempted to cross lesion but was unsuccessful. Cath complicated by proximal RCA dissection with subsequent chest pain which improved with SL-NTG. Now chest pain  free on medical management awaiting SNF placement. -Awaiting discharge to SNF -Continue ASA and plavix 59m daily for 12 months -Continue imdur 653mdaily -Continue lipitor 4019maily -Continue coreg 12.5mg16mD -Continue losartan 50mg71mly -Cardiac rehab on discharge  #HTN Emergency: [facility] with severely [facility] improved with nitro gtt.Much better controlled today. -Off nitro gtt -Continue coreg 12.5mg B72m-Continue losartan 50mg d8m -Continue imdur  60mg da28m #Dementia: -Continue aricept  #DMII: -Continue ISS while in-patient  #GERD: -Continue protonix  Awaiting discharge to SNF.  For questions or updates, please contact [facility] [email or web address] for contact info under        Signed, Wylie Coon Freada Bergeron7/2022, 7:40 AM

## 2021-02-05 DIAGNOSIS — K219 Gastro-esophageal reflux disease without esophagitis: Secondary | ICD-10-CM | POA: Diagnosis not present

## 2021-02-05 DIAGNOSIS — Z7901 Long term (current) use of anticoagulants: Secondary | ICD-10-CM | POA: Diagnosis not present

## 2021-02-05 DIAGNOSIS — F332 Major depressive disorder, recurrent severe without psychotic features: Secondary | ICD-10-CM | POA: Diagnosis not present

## 2021-02-05 DIAGNOSIS — E785 Hyperlipidemia, unspecified: Secondary | ICD-10-CM

## 2021-02-05 DIAGNOSIS — N183 Chronic kidney disease, stage 3 unspecified: Secondary | ICD-10-CM | POA: Diagnosis not present

## 2021-02-05 DIAGNOSIS — E1159 Type 2 diabetes mellitus with other circulatory complications: Secondary | ICD-10-CM | POA: Diagnosis not present

## 2021-02-05 DIAGNOSIS — L97512 Non-pressure chronic ulcer of other part of right foot with fat layer exposed: Secondary | ICD-10-CM | POA: Diagnosis not present

## 2021-02-05 DIAGNOSIS — E039 Hypothyroidism, unspecified: Secondary | ICD-10-CM | POA: Diagnosis not present

## 2021-02-05 DIAGNOSIS — I10 Essential (primary) hypertension: Secondary | ICD-10-CM

## 2021-02-05 DIAGNOSIS — I2510 Atherosclerotic heart disease of native coronary artery without angina pectoris: Secondary | ICD-10-CM

## 2021-02-05 DIAGNOSIS — F322 Major depressive disorder, single episode, severe without psychotic features: Secondary | ICD-10-CM | POA: Diagnosis not present

## 2021-02-05 DIAGNOSIS — I951 Orthostatic hypotension: Secondary | ICD-10-CM | POA: Diagnosis not present

## 2021-02-05 DIAGNOSIS — I5032 Chronic diastolic (congestive) heart failure: Secondary | ICD-10-CM | POA: Diagnosis not present

## 2021-02-05 DIAGNOSIS — F0391 Unspecified dementia with behavioral disturbance: Secondary | ICD-10-CM | POA: Diagnosis not present

## 2021-02-05 DIAGNOSIS — N320 Bladder-neck obstruction: Secondary | ICD-10-CM | POA: Diagnosis not present

## 2021-02-05 DIAGNOSIS — Z79899 Other long term (current) drug therapy: Secondary | ICD-10-CM | POA: Diagnosis not present

## 2021-02-05 DIAGNOSIS — R55 Syncope and collapse: Secondary | ICD-10-CM | POA: Diagnosis not present

## 2021-02-05 DIAGNOSIS — I214 Non-ST elevation (NSTEMI) myocardial infarction: Secondary | ICD-10-CM | POA: Diagnosis not present

## 2021-02-05 DIAGNOSIS — I700 Atherosclerosis of aorta: Secondary | ICD-10-CM | POA: Diagnosis not present

## 2021-02-05 DIAGNOSIS — R0902 Hypoxemia: Secondary | ICD-10-CM | POA: Diagnosis not present

## 2021-02-05 DIAGNOSIS — I161 Hypertensive emergency: Secondary | ICD-10-CM | POA: Diagnosis not present

## 2021-02-05 DIAGNOSIS — N400 Enlarged prostate without lower urinary tract symptoms: Secondary | ICD-10-CM | POA: Diagnosis not present

## 2021-02-05 DIAGNOSIS — R0602 Shortness of breath: Secondary | ICD-10-CM | POA: Diagnosis not present

## 2021-02-05 DIAGNOSIS — R079 Chest pain, unspecified: Secondary | ICD-10-CM | POA: Diagnosis not present

## 2021-02-05 DIAGNOSIS — E119 Type 2 diabetes mellitus without complications: Secondary | ICD-10-CM | POA: Diagnosis not present

## 2021-02-05 DIAGNOSIS — Z8673 Personal history of transient ischemic attack (TIA), and cerebral infarction without residual deficits: Secondary | ICD-10-CM | POA: Diagnosis not present

## 2021-02-05 DIAGNOSIS — I509 Heart failure, unspecified: Secondary | ICD-10-CM | POA: Diagnosis not present

## 2021-02-05 DIAGNOSIS — G4700 Insomnia, unspecified: Secondary | ICD-10-CM | POA: Diagnosis not present

## 2021-02-05 DIAGNOSIS — F0390 Unspecified dementia without behavioral disturbance: Secondary | ICD-10-CM | POA: Diagnosis not present

## 2021-02-05 DIAGNOSIS — R45851 Suicidal ideations: Secondary | ICD-10-CM | POA: Diagnosis not present

## 2021-02-05 LAB — RESP PANEL BY RT-PCR (FLU A&B, COVID) ARPGX2
Influenza A by PCR: NEGATIVE
Influenza B by PCR: NEGATIVE
SARS Coronavirus 2 by RT PCR: NEGATIVE

## 2021-02-05 LAB — GLUCOSE, CAPILLARY
Glucose-Capillary: 185 mg/dL — ABNORMAL HIGH (ref 70–99)
Glucose-Capillary: 190 mg/dL — ABNORMAL HIGH (ref 70–99)
Glucose-Capillary: 274 mg/dL — ABNORMAL HIGH (ref 70–99)

## 2021-02-05 MED ORDER — CLOPIDOGREL BISULFATE 75 MG PO TABS: 75.0000 mg | ORAL_TABLET | Freq: Every day | ORAL | 3 refills | Status: DC

## 2021-02-05 MED ORDER — NITROGLYCERIN 0.4 MG SL SUBL: 0.4000 mg | SUBLINGUAL_TABLET | SUBLINGUAL | 3 refills | Status: AC | PRN

## 2021-02-05 MED ORDER — ASPIRIN 81 MG PO TBEC: 81.0000 mg | DELAYED_RELEASE_TABLET | Freq: Every day | ORAL | 3 refills | Status: AC

## 2021-02-05 MED ORDER — LANTUS SOLOSTAR 100 UNIT/ML ~~LOC~~ SOPN: 10.0000 [IU] | PEN_INJECTOR | Freq: Every day | SUBCUTANEOUS | 11 refills | Status: DC

## 2021-02-05 MED ORDER — ATORVASTATIN CALCIUM 40 MG PO TABS: 40.0000 mg | ORAL_TABLET | Freq: Every day | ORAL | 3 refills | Status: DC

## 2021-02-05 MED ORDER — NITROGLYCERIN 1 MG/10 ML FOR IR/CATH LAB
INTRA_ARTERIAL | Status: AC
  Filled 2021-02-05: qty 10

## 2021-02-05 MED ORDER — VALSARTAN 80 MG PO TABS: 80.0000 mg | ORAL_TABLET | Freq: Every day | ORAL | 3 refills | Status: DC

## 2021-02-05 MED ORDER — ISOSORBIDE MONONITRATE ER 60 MG PO TB24: 60.0000 mg | ORAL_TABLET | Freq: Every day | ORAL | 3 refills | Status: DC

## 2021-02-05 MED ORDER — CARVEDILOL 12.5 MG PO TABS: 12.5000 mg | ORAL_TABLET | Freq: Two times a day (BID) | ORAL | 3 refills | Status: DC

## 2021-02-05 NOTE — TOC Transition Note (Signed)
Transition of Care [facility]) - CM/SW Discharge Note   Patient Details  Name: Frank Phillips MRN: [id number] Date of Birth: Jan 15, 1946  Transition of Care [facility]) CM/SW Contact:  Tresa Endo Phone Number: 02/05/2021, 9:45 AM   Clinical Narrative:    Patient will DC to: [place], [place] [address] Anticipated DC date: 02/05/2021 Family notified: Daughter Transport by: Daughter   Per MD patient ready for DC to [facility] Room 107. RN to call report prior to discharge [phone]). RN, patient, patient's family, and facility notified of DC. Discharge Summary and FL2 sent to facility. DC packet on chart. Ambulance transport requested for patient.   CSW will sign off for now as social work intervention is no longer needed. Please consult [place] again if new needs arise.            Patient Goals and CMS Choice        Discharge Placement                       Discharge Plan and Services                                     Social Determinants of Health (SDOH) Interventions     Readmission Risk Interventions No flowsheet data found.

## 2021-02-05 NOTE — Progress Notes (Signed)
RN removed IV. Pt dressed himself. Belongings with pt and daughter, including home meds RN brought back from pharmacy. NT transporting pt to daughter's vehicle and daughter is transporting pt to SNF.

## 2021-02-05 NOTE — Plan of Care (Signed)

## 2021-02-05 NOTE — Discharge Summary (Addendum)
Discharge Summary    Patient ID: Frank Phillips MRN: [id number]; DOB: Apr 12, 1946  Admit date: 02/01/2021 Discharge date: 02/05/2021  PCP:  Myrlene Broker, MD   [place]  Cardiologist:  Berniece Salines, DO  Advanced Practice Provider:  No care team member to display Electrophysiologist:  None    Discharge Diagnoses    Principal Problem:   NSTEMI (non-ST elevated myocardial infarction) [facility]) Active Problems:   Diabetes mellitus without complication ([place])   Coronary artery disease   Essential hypertension   Hyperlipidemia LDL goal <70    Diagnostic Studies/Procedures    Left heart catheterization 02/01/21: 1. Severe single-vessel coronary artery disease with 95% in-stent restenosis of proximal RCA and chronic total occlusion of the mid RCA distal to takeoff of RV marginal.  The distal vessel fills via faint left-to-right collaterals. 2. Moderate diffuse LAD and LCx disease of up to 60-70%.  Small branch of OM1 has 99% ostial stenosis but is too small/distal for intervention. 3. Normal left ventricular systolic function and filling pressure. 4. Unsuccessful attempted PCI of proximal/mid RCA occlusion with failure to cross lesion with guidewire.  Intervention was complicated by proximal RCA dissection.  Recommendations: 1. Dual antiplatelet therapy with aspirin and clopidogrel for at least 12 months. 2. Aggressive secondary prevention of coronary artery disease. 3. Titrate nitroglycerin for continued relief of chest pain as well as for blood pressure control. 4. If the patient has refractory angina, could consider PCI to OM1/OM2, though these lesions do not appear critical.   _____________   History of Present Illness     Frank Phillips is a 75 y.o. male with a PMH of CAD s/p stenting  HTN, HLD, DM type 2, SVT, syncope, orthostatic hypotension, GERD, anxiety, memory loss, and OSA, who presented to [facility] with chest pain. He was touring an assisted  living facility when he developed sudden onset chest pain radiating to his neck. He described a pressure-like squeezing with associated diaphoresis. EKG showed sinus rhythm with RBBB, no ischemic changes. He was found to have elevated troponin peaked at 10.6. Echocardiogram showed EF 55-60%, G1DD, and basal inferior/inferoseptal LV wall motion hypokinesis. He was seen in consultation by Dr. Harriet Masson [facility]) and recommended for transfer to [facility] for [place].   Hospital Course     Consultants: DM coordinator  1. NSTEMI in patient with CAD s/p PCI to RCA: Patient presented with chest pain found to have NSTEMI with troponin elevation and inferior wall hypokinesis on TTE prompting transfer to [facility] hospital. Underwent LHC on 02/01/21 which showed severe in-stent restenosis of prox RCA with CTO of mid-to-distal RCA. Attempted to cross lesion but was unsuccessful. Cath complicated by proximal RCA dissection with subsequent chest pain which improved with SL-NTG.Now chest pain free on medical management. -Continue ASA and plavix 48m daily for 12 months -Continueimdur 678mdaily -Continue lipitor 4010maily -Continuecoreg 12.5mg83mD -Continuelosartan 50mg37mly -Cardiacrehab on discharge -Plan to discharge to SNF  2. HTN Emergency: [facility] with nitro gtt.Now well controlled on oral antihypertensives -Continuecoreg 12.5mg B66m-Continuelosartan 50mg d74m -Continueimdur 60mg da32m 3. DM type II: A1C 7.0 this admission. DM coordinator followed.  - Home metformin and glipizide continued at discharge  4. GERD:  -Continue protonix  5. Dementia:  -Continue aricept  Seen by PT and recommended for SNF placement. SW assisted with placement. Bed available 02/05/21.    Did the patient have an acute  coronary syndrome (MI, NSTEMI, STEMI, etc) this admission?:  Yes                               AHA/ACC Clinical  Performance & Quality Measures: 1. Aspirin prescribed? - Yes 2. ADP Receptor Inhibitor (Plavix/Clopidogrel, Brilinta/Ticagrelor or Effient/Prasugrel) prescribed (includes medically managed patients)? - Yes 3. Beta Blocker prescribed? - Yes 4. High Intensity Statin (Lipitor 40-78m or Crestor 20-464m prescribed? - Yes 5. EF assessed during THIS hospitalization? - Yes 6. For EF <40%, was ACEI/ARB prescribed? - Yes 7. For EF <40%, Aldosterone Antagonist (Spironolactone or Eplerenone) prescribed? - Not Applicable (EF >/= 4035%8. Cardiac Rehab Phase II ordered (including medically managed patients)? - Yes       _____________  Discharge Vitals Blood pressure (!) 111/57, pulse 64, temperature 98.3 F (36.8 C), temperature source Oral, resp. rate 16, height 5' 8"  (1.727 m), weight 78.4 kg, SpO2 95 %.  Filed Weights   02/01/21 1024  Weight: 78.4 kg    Labs & Radiologic Studies    CBC No results for input(s): WBC, NEUTROABS, HGB, HCT, MCV, PLT in the last 72 hours. Basic Metabolic Panel Recent Labs    02/03/21 0839  NA 137  K 3.7  CL 102  CO2 25  GLUCOSE 257*  BUN 9  CREATININE 0.83  CALCIUM 9.2   Liver Function Tests No results for input(s): AST, ALT, ALKPHOS, BILITOT, PROT, ALBUMIN in the last 72 hours. No results for input(s): LIPASE, AMYLASE in the last 72 hours. High Sensitivity Troponin:   No results for input(s): TROPONINIHS in the last 720 hours.  BNP Invalid input(s): POCBNP D-Dimer No results for input(s): DDIMER in the last 72 hours. Hemoglobin A1C No results for input(s): HGBA1C in the last 72 hours. Fasting Lipid Panel No results for input(s): CHOL, HDL, LDLCALC, TRIG, CHOLHDL, LDLDIRECT in the last 72 hours. Thyroid Function Tests No results for input(s): TSH, T4TOTAL, T3FREE, THYROIDAB in the last 72 hours.  Invalid input(s): FREET3 _____________  CARDIAC CATHETERIZATION  Result Date: 02/02/2021 Conclusions: 1. Severe single-vessel coronary artery  disease with 95% in-stent restenosis of proximal RCA and chronic total occlusion of the mid RCA distal to takeoff of RV marginal.  The distal vessel fills via faint left-to-right collaterals. 2. Moderate diffuse LAD and LCx disease of up to 60-70%.  Small branch of OM1 has 99% ostial stenosis but is too small/distal for intervention. 3. Normal left ventricular systolic function and filling pressure. 4. Unsuccessful attempted PCI of proximal/mid RCA occlusion with failure to cross lesion with guidewire.  Intervention was complicated by proximal RCA dissection. Recommendations: 1. Dual antiplatelet therapy with aspirin and clopidogrel for at least 12 months. 2. Aggressive secondary prevention of coronary artery disease. 3. Titrate nitroglycerin for continued relief of chest pain as well as for blood pressure control. 4. If the patient has refractory angina, could consider PCI to OM1/OM2, though these lesions do not appear critical. ChNelva BushMD [facility]   Disposition   Pt is being discharged home today in good condition.  Follow-up Plans & Appointments     Follow-up Information    Tobb, KaGodfrey PickDO Follow up on 02/15/2021.   Specialty: Cardiology Why: Please arrive 15 minute early for your 2pm post-hospital cardiology appointment. Contact information: [address] [place] [phone]            Discharge Instructions    Amb  Referral to Cardiac Rehabilitation   Complete by: As directed    To [place]   Diagnosis: NSTEMI   After initial evaluation and assessments completed: Virtual Based Care may be provided alone or in conjunction with Phase 2 Cardiac Rehab based on patient barriers.: Yes   Diet - low sodium heart healthy   Complete by: As directed    Discharge wound care:   Complete by: As directed    Apply iodine from the swabsticks or swab pads from clean utility to dark spot on right great toe tip.  Allow to air dry.   Increase  activity slowly   Complete by: As directed       Discharge Medications   Allergies as of 02/05/2021      Reactions   Levaquin [levofloxacin]    Out of mind      Medication List    STOP taking these medications   beclomethasone 40 MCG/ACT inhaler Commonly known as: QVAR   meloxicam 15 MG tablet Commonly known as: MOBIC     TAKE these medications   acetaminophen 650 MG CR tablet Commonly known as: TYLENOL Take 1,300 mg by mouth every 8 (eight) hours as needed for pain.   albuterol 108 (90 Base) MCG/ACT inhaler Commonly known as: VENTOLIN HFA Inhale 2 puffs into the lungs every 6 (six) hours as needed for wheezing or shortness of breath.   Apriso 0.375 g 24 hr capsule Generic drug: mesalamine Take 1,500 mg by mouth daily. pain   aspirin 81 MG EC tablet Take 1 tablet (81 mg total) by mouth daily. Swallow whole.   atorvastatin 40 MG tablet Commonly known as: LIPITOR Take 1 tablet (40 mg total) by mouth daily at 6 PM.   carvedilol 12.5 MG tablet Commonly known as: COREG Take 1 tablet (12.5 mg total) by mouth 2 (two) times daily with a meal.   clopidogrel 75 MG tablet Commonly known as: PLAVIX Take 1 tablet (75 mg total) by mouth daily with breakfast. Start taking on: February 06, 2021   donepezil 10 MG tablet Commonly known as: ARICEPT Take 10 mg by mouth at bedtime.   DULoxetine 60 MG capsule Commonly known as: CYMBALTA Take 60 mg by mouth at bedtime.   glipiZIDE 10 MG tablet Commonly known as: GLUCOTROL Take 10 mg by mouth 2 (two) times daily before a meal.   glucosamine-chondroitin 500-400 MG tablet Take 1 tablet by mouth daily.   isosorbide mononitrate 60 MG 24 hr tablet Commonly known as: IMDUR Take 1 tablet (60 mg total) by mouth daily.   Lantus SoloStar 100 UNIT/ML Solostar Pen Generic drug: insulin glargine Inject 10 Units into the skin at bedtime.   Magnesium Oxide 400 MG Caps Take 1 capsule (400 mg total) by mouth at bedtime. What changed:  how much to take   Melatonin 10 MG Tabs Take 10 mg by mouth at bedtime.   metFORMIN 500 MG tablet Commonly known as: GLUCOPHAGE Take 1 tablet by mouth 2 (two) times daily.   nitroGLYCERIN 0.4 MG SL tablet Commonly known as: NITROSTAT Place 1 tablet (0.4 mg total) under the tongue every 5 (five) minutes x 3 doses as needed for chest pain.   pantoprazole 40 MG tablet Commonly known as: PROTONIX Take 40 mg by mouth daily.   tamsulosin 0.4 MG Caps capsule Commonly known as: FLOMAX Take 0.4 mg by mouth daily.   temazepam 30 MG capsule Commonly known as: RESTORIL Take 30 mg by mouth at bedtime.   valsartan 80  MG tablet Commonly known as: DIOVAN Take 1 tablet (80 mg total) by mouth daily.            Discharge Care Instructions  (From admission, onward)         Start     Ordered   02/05/21 0000  Discharge wound care:       Comments: Apply iodine from the swabsticks or swab pads from clean utility to dark spot on right great toe tip.  Allow to air dry.   02/05/21 0956             Outstanding Labs/Studies   Check BMET at follow-up.   Duration of Discharge Encounter   Greater than 30 minutes including physician time.  Signed, Abigail Butts, PA-C 02/05/2021, 1:36 PM   Patient seen and examined and agree with Roby Lofts, PA-C as detailed above. Please see progress note from today. Discharging to SNF today.

## 2021-02-05 NOTE — TOC Progression Note (Signed)
Transition of Care [facility]) - Progression Note    Patient Details  Name: Frank Phillips MRN: [id number] Date of Birth: Aug 29, 1946  [facility]) CM/SW Contact  Reece Agar, [place] Phone Number: 02/05/2021, 9:48 AM  Clinical Narrative:    9:30am- CSW spoke with representative from [facility] confirming insurance auth, pt is approved for 7 days starting today ref# [id number]. CSW spoke with pt daughter, she is willing to transport pt to facility.         Expected Discharge Plan and Services                                                 Social Determinants of Health (SDOH) Interventions    Readmission Risk Interventions No flowsheet data found.

## 2021-02-05 NOTE — Progress Notes (Signed)
Progress Note  Patient Name: Frank Phillips Date of Encounter: 02/05/2021  [facility] Cardiologist: No primary care provider on file.   Subjective   Working with PT this morning. No chest pain. Wants to leave the hospital. Blood pressures well controlled. Awaiting discharge to SNF.  Inpatient Medications    Scheduled Meds: . aspirin EC  81 mg Oral Daily  . atorvastatin  40 mg Oral q1800  . carvedilol  12.5 mg Oral BID WC  . Chlorhexidine Gluconate Cloth  6 each Topical Daily  . clopidogrel  75 mg Oral Q breakfast  . docusate sodium  100 mg Oral BID  . DULoxetine  30 mg Oral BID  . enoxaparin (LOVENOX) injection  40 mg Subcutaneous Q24H  . insulin aspart  0-5 Units Subcutaneous QHS  . insulin aspart  0-9 Units Subcutaneous TID WC  . insulin glargine  10 Units Subcutaneous Daily  . isosorbide mononitrate  60 mg Oral Daily  . losartan  50 mg Oral Daily  . magnesium oxide  400 mg Oral QHS  . mesalamine  1.5 g Oral Daily  . pantoprazole  40 mg Oral Daily  . polyethylene glycol  17 g Oral BID  . sodium chloride flush  3 mL Intravenous Q12H  . sodium chloride flush  3 mL Intravenous Q12H  . tamsulosin  0.4 mg Oral Daily  . temazepam  30 mg Oral QHS   Continuous Infusions: . sodium chloride     PRN Meds: sodium chloride, acetaminophen, albuterol, bisacodyl, nitroGLYCERIN, ondansetron (ZOFRAN) IV, sodium chloride flush   Vital Signs    Vitals:   02/04/21 1939 02/04/21 2313 02/05/21 0340 02/05/21 0750  BP: 123/63 110/73 122/69 (!) 111/57  Pulse: 72 72 (!) 59 64  Resp: 20 20 20 16   Temp: 97.9 F (36.6 C) 98.1 F (36.7 C) 98.2 F (36.8 C) 98.3 F (36.8 C)  TempSrc: Oral Oral Oral Oral  SpO2: 96% 96% 95% 95%  Weight:      Height:        Intake/Output Summary (Last 24 hours) at 02/05/2021 0825 Last data filed at 02/04/2021 2313 Gross per 24 hour  Intake 240 ml  Output 500 ml  Net -260 ml   Last 3 Weights 02/01/2021 05/05/2019 06/06/2018  Weight (lbs) 172 lb 12.8 oz 160  lb 4.4 oz 161 lb 9.6 oz  Weight (kg) 78.382 kg 72.7 kg 73.3 kg      Telemetry    NSR with occasional PVCs - Personally Reviewed  ECG    No new tracing - Personally Reviewed  Physical Exam   GEN: No acute distress.  Elderly male. Hard of hearing Neck: No JVD Cardiac: RRR, no murmurs, rubs, or gallops.  Respiratory: Clear to auscultation bilaterally. GI: Soft, nontender, non-distended  MS: No edema; No deformity. Neuro:  Nonfocal  Psych: Flat affect   Labs    High Sensitivity Troponin:  No results for input(s): TROPONINIHS in the last 720 hours.    Chemistry Recent Labs  Lab 02/01/21 1328 02/02/21 0034 02/03/21 0839  NA 138 135 137  K 3.8 3.7 3.7  CL 105 104 102  CO2 23 22 25   GLUCOSE 194* 168* 257*  BUN 11 9 9   CREATININE 0.86 0.80 0.83  CALCIUM 9.0 8.6* 9.2  PROT 6.0*  --   --   ALBUMIN 3.2*  --   --   AST 33  --   --   ALT 20  --   --  ALKPHOS 67  --   --   BILITOT 0.7  --   --   GFRNONAA >60 >60 >60  ANIONGAP 10 9 10      Hematology Recent Labs  Lab 02/01/21 1328 02/02/21 0034  WBC 9.8 8.9  RBC 4.01* 3.87*  HGB 10.6* 10.1*  HCT 33.0* 32.4*  MCV 82.3 83.7  MCH 26.4 26.1  MCHC 32.1 31.2  RDW 15.5 15.8*  PLT 188 178    BNPNo results for input(s): BNP, PROBNP in the last 168 hours.   DDimer No results for input(s): DDIMER in the last 168 hours.   Radiology    No results found.  Cardiac Studies   Cath 02/01/21: Conclusions: 1. Severe single-vessel coronary artery disease with 95% in-stent restenosis of proximal RCA and chronic total occlusion of the mid RCA distal to takeoff of RV marginal. The distal vessel fills via faint left-to-right collaterals. 2. Moderate diffuse LAD and LCx disease of up to 60-70%. Small branch of OM1 has 99% ostial stenosis but is too small/distal for intervention. 3. Normal left ventricular systolic function and filling pressure. 4. Unsuccessful attempted PCI of proximal/mid RCA occlusion with failure to cross  lesion with guidewire. Intervention was complicated by proximal RCA dissection.  Recommendations: 1. Dual antiplatelet therapy with aspirin and clopidogrel for at least 12 months. 2. Aggressive secondary prevention of coronary artery disease. 3. Titrate nitroglycerin for continued relief of chest pain as well as for blood pressure control. 4. If the patient has refractory angina, could consider PCI to OM1/OM2, though these lesions do not appear critical.  TTE 2017: Study Conclusions  - Left ventricle: The cavity size was normal. There was moderate  focal basal hypertrophy. Systolic function was vigorous. The  estimated ejection fraction was in the range of 65% to 70%. Wall  motion was normal; there were no regional wall motion  abnormalities. Features are consistent with a pseudonormal left  ventricular filling pattern, with concomitant abnormal relaxation  and increased filling pressure (grade 2 diastolic dysfunction).  - Aortic valve: Moderately calcified annulus. Trileaflet; mildly  thickened, mildly calcified leaflets.  - Mitral valve: Minimal focal calcification of the anterior leaflet  (medial segment(s)). There was trivial regurgitation.  - Tricuspid valve: There was trivial regurgitation.  - Pulmonic valve: There was mild regurgitation.  - Pulmonary arteries: PA peak pressure: 31 mm Hg (S).   TTE 06/02/20 (OSH):echo 06/02/20 borderline concentric LVH, EF 55-60%, mild MR, mild PR, trace TR  TTE4/2/22 EF 55-60%, impaired relaxation Basal in, and inferoseptal LV wall motion is hypokinetic.  Patient Profile     75 y.o. male with history ofSVT, NSVT, syncope, orhtostatic hypotension, CVA, HTN, HLD, DM-2 OSA, anxiety, memory loss, GERD, chronic pain and nephrolithiasis who initially presented to [facility] with chest pain found to have elevated troponin and inferior wall hypokinesis on TTE consistent with NSTEMI prompting transfer to [facility] hospital. Underwent LHC on  02/01/21 where he was found to have 95% in-stent restenosis of proximal RCA and CTO of the mid RCA. Attempted to cross lesion with guidewire but was unsuccessful. Intervention was complicated by proximal RCA dissection with subsequent chest pain that improved with NTG.Now on medical management. Awaiting discharge to SNF.  Assessment & Plan    #NSTEMI: #Coronary Artery Disease: Patient presented with chest pain found to have NSTEMI with troponin elevation and inferior wall hypokinesis on TTE prompting transfer to [facility] hospital. Underwent LHC on 02/01/21 which showed severe in-stent restenosis of prox RCA with CTO of mid-to-distal RCA. Attempted to  cross lesion but was unsuccessful. Cath complicated by proximal RCA dissection with subsequent chest pain which improved with SL-NTG. Now chest pain free on medical management awaiting SNF placement. -Awaiting discharge to SNF -Continue ASA and plavix 7m daily for 12 months -Continue imdur 641mdaily -Continue lipitor 4063maily -Continuecoreg 12.5mg60mD -Continuelosartan 50mg53mly -Cardiacrehab on discharge -Plan to discharge to SNF  #HTN Emergency: Patient with severely elevated blood pressures on arrival to [facility] improved with nitro gtt.Now well controlled on oral antihypertensives -Continuecoreg 12.5mg B59m-Continuelosartan 50mg d45m -Continueimdur 60mg da54m #Dementia: -Continue aricept  #DMII: -Continue ISS while in-patient  #GERD: -Continue protonix  Awaiting discharge to SNF.  For questions or updates, please contact [facility] [email or web address] for contact info under        Signed, Jayshon Dommer Freada Bergeron8/2022, 8:25 AM

## 2021-02-05 NOTE — Progress Notes (Signed)
Physical Therapy Treatment Patient Details Name: Frank Phillips MRN: [id number] DOB: [birth year] Today's Date: 02/05/2021    History of Present Illness Pt is a 75 y/o male admitted 4/4 secondary to NSTEMI. Pt is s/p heart cath on 4/4. PMH includes HTN and DM.    PT Comments    Pt with flat affect but pleasant and willing to mobilize. Pt with improved gait tolerance and distance with use of RW with cues for use and safety. Pt educated for HEP and progression of mobility. Pt reports he has had intermittent assist of daughter and at least 5 falls. Pt states he has rollator at home but has not attempted to use it despite falls. Pt progressing with gait and activity tolerance but cognition and awareness limiting safety with pt unable to have daily supervision for mobility and safety. Daughter agreeable to ST-SNF to maximize function with long term plan of ALF and discussed this with her today via phone.   HR 76-86 Pre gait 148/75 (92) Post 154/89 (99) SpO2 93% on RA    Follow Up Recommendations  SNF;Supervision for mobility/OOB     Equipment Recommendations  None recommended by PT    Recommendations for Other Services       Precautions / Restrictions Precautions Precautions: Fall Restrictions Weight Bearing Restrictions: No    Mobility  Bed Mobility Overal bed mobility: Needs Assistance Bed Mobility: Supine to Sit     Supine to sit: Supervision;HOB elevated     General bed mobility comments: HOB 15 degrees with use of rail and supervision for safety with increased time to rise    Transfers Overall transfer level: Needs assistance   Transfers: Sit to/from Stand Sit to Stand: Min guard         General transfer comment: guarding for safety with cues for hand placement  Ambulation/Gait Ambulation/Gait assistance: Min guard Gait Distance (Feet): 250 Feet Assistive device: Rolling walker (2 wheeled) Gait Pattern/deviations: Step-through pattern;Decreased stride  length;Trunk flexed   Gait velocity interpretation: 1.31 - 2.62 ft/sec, indicative of limited community ambulator General Gait Details: pt with cues for proximity to RW, direction and safety. Pt unable to recall room number but could locate room when near   Stairs             Wheelchair Mobility    Modified Rankin (Stroke Patients Only)       Balance Overall balance assessment: Needs assistance   Sitting balance-Leahy Scale: Fair Sitting balance - Comments: EOB without physical assist   Standing balance support: Bilateral upper extremity supported Standing balance-Leahy Scale: Poor Standing balance comment: RW for gait                            Cognition Arousal/Alertness: Awake/alert Behavior During Therapy: Flat affect Overall Cognitive Status: Impaired/Different from baseline Area of Impairment: Safety/judgement;Problem solving                     Memory: Decreased short-term memory   Safety/Judgement: Decreased awareness of deficits   Problem Solving: Slow processing General Comments: pt with urinary incontinence on arrival and pt states awareness but had not contacted staff for assist. Pt with flat affect and able to return to room but could not recall number      Exercises General Exercises - Lower Extremity Long Arc Quad: AROM;Both;15 reps;Seated Hip Flexion/Marching: AROM;Both;15 reps;Seated    General Comments        Pertinent Vitals/Pain Pain Assessment:  No/denies pain    Home Living                      Prior Function            PT Goals (current goals can now be found in the care plan section) Progress towards PT goals: Progressing toward goals    Frequency    Min 3X/week      PT Plan Current plan remains appropriate;Frequency needs to be updated    Co-evaluation              AM-PAC PT "6 Clicks" Mobility   Outcome Measure  Help needed turning from your back to your side while in a flat  bed without using bedrails?: None Help needed moving from lying on your back to sitting on the side of a flat bed without using bedrails?: None Help needed moving to and from a bed to a chair (including a wheelchair)?: A Little Help needed standing up from a chair using your arms (e.g., wheelchair or bedside chair)?: A Little Help needed to walk in hospital room?: A Little Help needed climbing 3-5 steps with a railing? : A Little 6 Click Score: 20    End of Session Equipment Utilized During Treatment: Gait belt Activity Tolerance: Patient tolerated treatment well Patient left: in chair;with call bell/phone within reach;with chair alarm set Nurse Communication: Mobility status PT Visit Diagnosis: Unsteadiness on feet (R26.81);Muscle weakness (generalized) (M62.81)     Time: 4235-3614 PT Time Calculation (min) (ACUTE ONLY): 24 min  Charges:  $Gait Training: 8-22 mins $Therapeutic Exercise: 8-22 mins                     [place], PT Acute Rehabilitation Services Pager: [phone] Office: [place] 02/05/2021, 12:13 PM

## 2021-02-06 DIAGNOSIS — Z79899 Other long term (current) drug therapy: Secondary | ICD-10-CM | POA: Diagnosis not present

## 2021-02-07 DIAGNOSIS — I161 Hypertensive emergency: Secondary | ICD-10-CM | POA: Diagnosis not present

## 2021-02-07 DIAGNOSIS — F0391 Unspecified dementia with behavioral disturbance: Secondary | ICD-10-CM | POA: Diagnosis not present

## 2021-02-07 DIAGNOSIS — E1159 Type 2 diabetes mellitus with other circulatory complications: Secondary | ICD-10-CM | POA: Diagnosis not present

## 2021-02-07 DIAGNOSIS — I214 Non-ST elevation (NSTEMI) myocardial infarction: Secondary | ICD-10-CM | POA: Diagnosis not present

## 2021-02-07 DIAGNOSIS — F322 Major depressive disorder, single episode, severe without psychotic features: Secondary | ICD-10-CM | POA: Diagnosis not present

## 2021-02-07 DIAGNOSIS — N400 Enlarged prostate without lower urinary tract symptoms: Secondary | ICD-10-CM | POA: Diagnosis not present

## 2021-02-08 DIAGNOSIS — I5032 Chronic diastolic (congestive) heart failure: Secondary | ICD-10-CM | POA: Diagnosis not present

## 2021-02-08 DIAGNOSIS — I509 Heart failure, unspecified: Secondary | ICD-10-CM | POA: Diagnosis not present

## 2021-02-08 DIAGNOSIS — I10 Essential (primary) hypertension: Secondary | ICD-10-CM | POA: Diagnosis not present

## 2021-02-08 DIAGNOSIS — I214 Non-ST elevation (NSTEMI) myocardial infarction: Secondary | ICD-10-CM | POA: Diagnosis not present

## 2021-02-08 DIAGNOSIS — E039 Hypothyroidism, unspecified: Secondary | ICD-10-CM | POA: Diagnosis not present

## 2021-02-10 DIAGNOSIS — L97512 Non-pressure chronic ulcer of other part of right foot with fat layer exposed: Secondary | ICD-10-CM | POA: Diagnosis not present

## 2021-02-11 DIAGNOSIS — F332 Major depressive disorder, recurrent severe without psychotic features: Secondary | ICD-10-CM | POA: Diagnosis not present

## 2021-02-11 DIAGNOSIS — R45851 Suicidal ideations: Secondary | ICD-10-CM | POA: Diagnosis not present

## 2021-02-15 ENCOUNTER — Other Ambulatory Visit: Payer: Self-pay

## 2021-02-15 ENCOUNTER — Ambulatory Visit: Payer: PPO | Admitting: Cardiology

## 2021-02-15 ENCOUNTER — Encounter: Payer: Self-pay | Admitting: Cardiology

## 2021-02-15 VITALS — BP 120/50 | HR 66 | Ht 68.0 in | Wt 176.0 lb

## 2021-02-15 DIAGNOSIS — E119 Type 2 diabetes mellitus without complications: Secondary | ICD-10-CM

## 2021-02-15 DIAGNOSIS — E785 Hyperlipidemia, unspecified: Secondary | ICD-10-CM

## 2021-02-15 DIAGNOSIS — I10 Essential (primary) hypertension: Secondary | ICD-10-CM | POA: Diagnosis not present

## 2021-02-15 DIAGNOSIS — I2510 Atherosclerotic heart disease of native coronary artery without angina pectoris: Secondary | ICD-10-CM | POA: Diagnosis not present

## 2021-02-15 NOTE — Patient Instructions (Signed)
Medication Instructions:  Your physician recommends that you continue on your current medications as directed. Please refer to the Current Medication list given to you today.  *If you need a refill on your cardiac medications before your next appointment, please call your pharmacy*   Lab Work: None If you have labs (blood work) drawn today and your tests are completely normal, you will receive your results only by: Marland Kitchen MyChart Message (if you have MyChart) OR . A paper copy in the mail If you have any lab test that is abnormal or we need to change your treatment, we will call you to review the results.   Testing/Procedures: None   Follow-Up: At [facility], you and your health needs are our priority.  As part of our continuing mission to provide you with exceptional heart care, we have created designated Provider Care Teams.  These Care Teams include your primary Cardiologist (physician) and Advanced Practice Providers (APPs -  Physician Assistants and Nurse Practitioners) who all work together to provide you with the care you need, when you need it.  We recommend signing up for the patient portal called "MyChart".  Sign up information is provided on this After Visit Summary.  MyChart is used to connect with patients for Virtual Visits (Telemedicine).  Patients are able to view lab/test results, encounter notes, upcoming appointments, etc.  Non-urgent messages can be sent to your provider as well.   To learn more about what you can do with MyChart, go to [email or web address].    Your next appointment:   6 month(s)  The format for your next appointment:   In Person  Provider:   Berniece Salines, DO   Other Instructions

## 2021-02-15 NOTE — Progress Notes (Signed)
Cardiology Office Note:    Date:  02/15/2021   ID:  Frank Phillips, DOB 02/29/1946, MRN [id number]  PCP:  Myrlene Broker, MD  Cardiologist:  Berniece Salines, DO  Electrophysiologist:  None   Referring MD: Myrlene Broker, MD   Chief Complaint  Patient presents with  . Hospitalization Follow-up   History of Present Illness:    Frank Phillips is a 75 y.o. male with a hx of coronary artery disease status post stent, hypertension, hyperlipidemia, diabetes type 2, SVT and syncope, #hypertension,, dementia and obesity.  I just saw the patient in hospital after he presented with chest pain, during his hospitalization Frank Phillips intervention elevated troponin peaking at 10.6.  With wall motion abnormality in the setting.  Respiratory hospitalization transaminases, hospital for left heart catheterization.  His heart catheterization did not show in-stent stenosis with CTO of the RCA, procedure was complicated by RCA dissection which improved.  He is here today for follow-up visit.  Patient is not happy.  He tells me he has been in a nursing home for physical therapy and does not want to stay there.  No other complaints at this time.   Past Medical History:  Diagnosis Date  . Anxiety   . Aortic atherosclerosis ([place]) 06/06/2018  . Arthritis   . Complication of anesthesia    " i HAD SOME KIND OF PROBLEM ,BUT I DONT REMEMBER WHAT IT WAS "  . Crohn disease ([place])   . Dementia ([place])   . Depression   . Diabetes ([place])   . Fall at home 04/06/2016  . High blood pressure   . High cholesterol   . Kidney stones   . Orthostatic hypotension 03/2016  . Stroke ([place])   . UTI (urinary tract infection) 05/2018   WITH HEMATURIA    Past Surgical History:  Procedure Laterality Date  . ANGIOPLASTY / STENTING FEMORAL    . APPENDECTOMY    . CORONARY STENT INTERVENTION N/A 02/01/2021   Procedure: CORONARY STENT INTERVENTION;  Surgeon: Nelva Bush, MD;  Location: [facility];  Service: Cardiovascular;   Laterality: N/A;  . kidney stones    . LEFT HEART CATH AND CORONARY ANGIOGRAPHY N/A 02/01/2021   Procedure: LEFT HEART CATH AND CORONARY ANGIOGRAPHY;  Surgeon: Nelva Bush, MD;  Location: [facility];  Service: Cardiovascular;  Laterality: N/A;  . TONSILLECTOMY    . TONSILLECTOMY AND ADENOIDECTOMY      Current Medications: Current Meds  Medication Sig  . acetaminophen (TYLENOL) 650 MG CR tablet Take 1,300 mg by mouth every 8 (eight) hours as needed for pain.  Marland Kitchen albuterol (VENTOLIN HFA) 108 (90 Base) MCG/ACT inhaler Inhale 2 puffs into the lungs every 6 (six) hours as needed for wheezing or shortness of breath.   . APRISO 0.375 g 24 hr capsule Take 1,500 mg by mouth daily. pain  . aspirin EC 81 MG EC tablet Take 1 tablet (81 mg total) by mouth daily. Swallow whole.  Marland Kitchen atorvastatin (LIPITOR) 40 MG tablet Take 1 tablet (40 mg total) by mouth daily at 6 PM.  . carvedilol (COREG) 12.5 MG tablet Take 1 tablet (12.5 mg total) by mouth 2 (two) times daily with a meal.  . clopidogrel (PLAVIX) 75 MG tablet Take 1 tablet (75 mg total) by mouth daily with breakfast.  . donepezil (ARICEPT) 10 MG tablet Take 10 mg by mouth at bedtime.  . DULoxetine (CYMBALTA) 60 MG capsule Take 60 mg by mouth at bedtime.  Marland Kitchen  glipiZIDE (GLUCOTROL) 10 MG tablet Take 10 mg by mouth 2 (two) times daily before a meal.  . glucosamine-chondroitin 500-400 MG tablet Take 1 tablet by mouth daily.  . insulin glargine (LANTUS) 100 UNIT/ML injection Inject 20 Units into the skin at bedtime.  . isosorbide mononitrate (IMDUR) 30 MG 24 hr tablet Take 30 mg by mouth daily. Hold if BP is less than 100  . losartan (COZAAR) 50 MG tablet Take 50 mg by mouth daily.  . Magnesium Oxide 400 MG CAPS Take 1 capsule (400 mg total) by mouth at bedtime.  . metFORMIN (GLUCOPHAGE) 500 MG tablet Take 1 tablet by mouth 2 (two) times daily.  . nitroGLYCERIN (NITROSTAT) 0.4 MG SL tablet Place 1 tablet (0.4 mg total) under the tongue every 5  (five) minutes x 3 doses as needed for chest pain.  . pantoprazole (PROTONIX) 40 MG tablet Take 40 mg by mouth daily.   . tamsulosin (FLOMAX) 0.4 MG CAPS capsule Take 0.4 mg by mouth daily.  . temazepam (RESTORIL) 7.5 MG capsule Take 7.5 mg by mouth at bedtime as needed for sleep.     Allergies:   Levaquin [levofloxacin]   Social History   Socioeconomic History  . Marital status: Married    Spouse name: Not on file  . Number of children: Not on file  . Years of education: Not on file  . Highest education level: Not on file  Occupational History  . Occupation: Retired    Comment: Writer  Tobacco Use  . Smoking status: Never Smoker  . Smokeless tobacco: Never Used  Vaping Use  . Vaping Use: Never used  Substance and Sexual Activity  . Alcohol use: No  . Drug use: No  . Sexual activity: Not on file  Other Topics Concern  . Not on file  Social History Narrative  . Not on file   Social Determinants of Health   Financial Resource Strain: Not on file  Food Insecurity: Not on file  Transportation Needs: Not on file  Physical Activity: Not on file  Stress: Not on file  Social Connections: Not on file     Family History: The patient's family history includes Cancer in his sister; Heart disease in his father and mother.  ROS:   Review of Systems  Constitution: Negative for decreased appetite, fever and weight gain.  HENT: Negative for congestion, ear discharge, hoarse voice and sore throat.   Eyes: Negative for discharge, redness, vision loss in right eye and visual halos.  Cardiovascular: Negative for chest pain, dyspnea on exertion, leg swelling, orthopnea and palpitations.  Respiratory: Negative for cough, hemoptysis, shortness of breath and snoring.   Endocrine: Negative for heat intolerance and polyphagia.  Hematologic/Lymphatic: Negative for bleeding problem. Does not bruise/bleed easily.  Skin: Negative for flushing, nail changes, rash and suspicious lesions.   Musculoskeletal: Negative for arthritis, joint pain, muscle cramps, myalgias, neck pain and stiffness.  Gastrointestinal: Negative for abdominal pain, bowel incontinence, diarrhea and excessive appetite.  Genitourinary: Negative for decreased libido, genital sores and incomplete emptying.  Neurological: Negative for brief paralysis, focal weakness, headaches and loss of balance.  Psychiatric/Behavioral: Negative for altered mental status, depression and suicidal ideas.  Allergic/Immunologic: Negative for HIV exposure and persistent infections.    EKGs/Labs/Other Studies Reviewed:    The following studies were reviewed today:   EKG:  The ekg ordered today demonstrates   Left heart catheterization 02/01/21: 1. Severe single-vessel coronary artery disease with 95% in-stent restenosis of proximal RCA and  chronic total occlusion of the mid RCA distal to takeoff of RV marginal. The distal vessel fills via faint left-to-right collaterals. 2. Moderate diffuse LAD and LCx disease of up to 60-70%. Small branch of OM1 has 99% ostial stenosis but is too small/distal for intervention. 3. Normal left ventricular systolic function and filling pressure. 4. Unsuccessful attempted PCI of proximal/mid RCA occlusion with failure to cross lesion with guidewire. Intervention was complicated by proximal RCA dissection.  Recommendations: 1. Dual antiplatelet therapy with aspirin and clopidogrel for at least 12 months. 2. Aggressive secondary prevention of coronary artery disease. 3. Titrate nitroglycerin for continued relief of chest pain as well as for blood pressure control. 4. If the patient has refractory angina, could consider PCI to OM1/OM2, though these lesions do not appear critical.    Recent Labs: 02/01/2021: ALT 20; Magnesium 1.5; TSH 2.557 02/02/2021: Hemoglobin 10.1; Platelets 178 02/03/2021: BUN 9; Creatinine, Ser 0.83; Potassium 3.7; Sodium 137  Recent Lipid Panel No results found for: CHOL,  TRIG, HDL, CHOLHDL, VLDL, LDLCALC, LDLDIRECT  Physical Exam:    VS:  BP (!) 120/50 (BP Location: Left Arm, Patient Position: Sitting, Cuff Size: Normal)   Pulse 66   Ht 5' 8"  (1.727 m)   Wt 176 lb (79.8 kg)   SpO2 93%   BMI 26.76 kg/m     Wt Readings from Last 3 Encounters:  02/15/21 176 lb (79.8 kg)  02/01/21 172 lb 12.8 oz (78.4 kg)  05/05/19 160 lb 4.4 oz (72.7 kg)     GEN: Well nourished, well developed in no acute distress HEENT: Normal NECK: No JVD; No carotid bruits LYMPHATICS: No lymphadenopathy CARDIAC: S1S2 noted,RRR, no murmurs, rubs, gallops RESPIRATORY:  Clear to auscultation without rales, wheezing or rhonchi  ABDOMEN: Soft, non-tender, non-distended, +bowel sounds, no guarding. EXTREMITIES: No edema, No cyanosis, no clubbing MUSCULOSKELETAL:  No deformity  SKIN: Warm and dry NEUROLOGIC:  Alert and oriented x 3, non-focal PSYCHIATRIC:  Normal affect, good insight  ASSESSMENT:    1. Coronary artery disease involving native coronary artery of native heart without angina pectoris   2. Essential hypertension   3. Diabetes mellitus without complication ([place])   4. Hyperlipidemia LDL goal <70    PLAN:     No angina symptoms today.  We will continue patient his current medication regimen.  He is on aspirin and his Plavix as well as atorvastatin we will continue this regimen for now.  Blood pressure is acceptable, continue with current antihypertensive regimen.  Hyperlipidemia - continue with current statin medication.  The patient is in agreement with the above plan. The patient left the office in stable condition.  The patient will follow up in 6 months or sooner if needed.   Medication Adjustments/Labs and Tests Ordered: Current medicines are reviewed at length with the patient today.  Concerns regarding medicines are outlined above.  No orders of the defined types were placed in this encounter.  No orders of the defined types were placed in this  encounter.   Patient Instructions  Medication Instructions:  Your physician recommends that you continue on your current medications as directed. Please refer to the Current Medication list given to you today.  *If you need a refill on your cardiac medications before your next appointment, please call your pharmacy*   Lab Work: None If you have labs (blood work) drawn today and your tests are completely normal, you will receive your results only by: Marland Kitchen MyChart Message (if you have MyChart) OR . A  paper copy in the mail If you have any lab test that is abnormal or we need to change your treatment, we will call you to review the results.   Testing/Procedures: None   Follow-Up: At [facility], you and your health needs are our priority.  As part of our continuing mission to provide you with exceptional heart care, we have created designated Provider Care Teams.  These Care Teams include your primary Cardiologist (physician) and Advanced Practice Providers (APPs -  Physician Assistants and Nurse Practitioners) who all work together to provide you with the care you need, when you need it.  We recommend signing up for the patient portal called "MyChart".  Sign up information is provided on this After Visit Summary.  MyChart is used to connect with patients for Virtual Visits (Telemedicine).  Patients are able to view lab/test results, encounter notes, upcoming appointments, etc.  Non-urgent messages can be sent to your provider as well.   To learn more about what you can do with MyChart, go to [email or web address].    Your next appointment:   6 month(s)  The format for your next appointment:   In Person  Provider:   Berniece Salines, DO   Other Instructions      Adopting a Healthy Lifestyle.  Know what a healthy weight is for you (roughly BMI <25) and aim to maintain this   Aim for 7+ servings of fruits and vegetables daily   65-80+ fluid ounces of water or unsweet tea for  healthy kidneys   Limit to max 1 drink of alcohol per day; avoid smoking/tobacco   Limit animal fats in diet for cholesterol and heart health - choose grass fed whenever available   Avoid highly processed foods, and foods high in saturated/trans fats   Aim for low stress - take time to unwind and care for your mental health   Aim for 150 min of moderate intensity exercise weekly for heart health, and weights twice weekly for bone health   Aim for 7-9 hours of sleep daily   When it comes to diets, agreement about the perfect plan isnt easy to find, even among the experts. Experts at the [employer] developed an idea known as the Healthy Eating Plate. Just imagine a plate divided into logical, healthy portions.   The emphasis is on diet quality:   Load up on vegetables and fruits - one-half of your plate: Aim for color and variety, and remember that potatoes dont count.   Go for whole grains - one-quarter of your plate: Whole wheat, barley, wheat berries, quinoa, oats, brown rice, and foods made with them. If you want pasta, go with whole wheat pasta.   Protein power - one-quarter of your plate: Fish, chicken, beans, and nuts are all healthy, versatile protein sources. Limit red meat.   The diet, however, does go beyond the plate, offering a few other suggestions.   Use healthy plant oils, such as olive, canola, soy, corn, sunflower and peanut. Check the labels, and avoid partially hydrogenated oil, which have unhealthy trans fats.   If youre thirsty, drink water. Coffee and tea are good in moderation, but skip sugary drinks and limit milk and dairy products to one or two daily servings.   The type of carbohydrate in the diet is more important than the amount. Some sources of carbohydrates, such as vegetables, fruits, whole grains, and beans-are healthier than others.   Finally, stay active  Signed, Frank Trompeter, DO  02/15/2021 4:33 PM    [facility]

## 2021-02-17 ENCOUNTER — Telehealth (HOSPITAL_COMMUNITY): Payer: Self-pay

## 2021-02-17 DIAGNOSIS — L97512 Non-pressure chronic ulcer of other part of right foot with fat layer exposed: Secondary | ICD-10-CM | POA: Diagnosis not present

## 2021-02-17 NOTE — Telephone Encounter (Signed)
Per phase I, fax cardiac rehab referral to [facility] cardiac rehab.

## 2021-03-01 DIAGNOSIS — I10 Essential (primary) hypertension: Secondary | ICD-10-CM | POA: Diagnosis not present

## 2021-03-01 DIAGNOSIS — F0390 Unspecified dementia without behavioral disturbance: Secondary | ICD-10-CM | POA: Diagnosis not present

## 2021-03-01 DIAGNOSIS — E119 Type 2 diabetes mellitus without complications: Secondary | ICD-10-CM | POA: Diagnosis not present

## 2021-03-01 DIAGNOSIS — E782 Mixed hyperlipidemia: Secondary | ICD-10-CM | POA: Diagnosis not present

## 2021-03-01 DIAGNOSIS — G4700 Insomnia, unspecified: Secondary | ICD-10-CM | POA: Diagnosis not present

## 2021-03-01 DIAGNOSIS — N1831 Chronic kidney disease, stage 3a: Secondary | ICD-10-CM | POA: Diagnosis not present

## 2021-03-10 DIAGNOSIS — F0634 Mood disorder due to known physiological condition with mixed features: Secondary | ICD-10-CM | POA: Diagnosis not present

## 2021-03-15 DIAGNOSIS — F332 Major depressive disorder, recurrent severe without psychotic features: Secondary | ICD-10-CM | POA: Diagnosis not present

## 2021-06-08 DIAGNOSIS — K5090 Crohn's disease, unspecified, without complications: Secondary | ICD-10-CM | POA: Diagnosis not present

## 2021-06-08 DIAGNOSIS — F0390 Unspecified dementia without behavioral disturbance: Secondary | ICD-10-CM | POA: Diagnosis not present

## 2021-06-08 DIAGNOSIS — E1165 Type 2 diabetes mellitus with hyperglycemia: Secondary | ICD-10-CM | POA: Diagnosis not present

## 2021-06-08 DIAGNOSIS — E782 Mixed hyperlipidemia: Secondary | ICD-10-CM | POA: Diagnosis not present

## 2021-06-08 DIAGNOSIS — F3342 Major depressive disorder, recurrent, in full remission: Secondary | ICD-10-CM | POA: Insufficient documentation

## 2021-06-08 DIAGNOSIS — I10 Essential (primary) hypertension: Secondary | ICD-10-CM | POA: Diagnosis not present

## 2021-08-12 DIAGNOSIS — Z111 Encounter for screening for respiratory tuberculosis: Secondary | ICD-10-CM | POA: Diagnosis not present

## 2021-08-20 DIAGNOSIS — Z7984 Long term (current) use of oral hypoglycemic drugs: Secondary | ICD-10-CM | POA: Diagnosis not present

## 2021-08-20 DIAGNOSIS — I10 Essential (primary) hypertension: Secondary | ICD-10-CM | POA: Diagnosis not present

## 2021-08-20 DIAGNOSIS — F332 Major depressive disorder, recurrent severe without psychotic features: Secondary | ICD-10-CM | POA: Diagnosis not present

## 2021-08-20 DIAGNOSIS — E1165 Type 2 diabetes mellitus with hyperglycemia: Secondary | ICD-10-CM | POA: Diagnosis not present

## 2021-08-20 DIAGNOSIS — Z794 Long term (current) use of insulin: Secondary | ICD-10-CM | POA: Diagnosis not present

## 2021-08-20 DIAGNOSIS — Z125 Encounter for screening for malignant neoplasm of prostate: Secondary | ICD-10-CM | POA: Diagnosis not present

## 2021-08-20 DIAGNOSIS — G4700 Insomnia, unspecified: Secondary | ICD-10-CM | POA: Diagnosis not present

## 2021-08-31 DIAGNOSIS — D649 Anemia, unspecified: Secondary | ICD-10-CM | POA: Diagnosis not present

## 2021-08-31 DIAGNOSIS — I10 Essential (primary) hypertension: Secondary | ICD-10-CM | POA: Diagnosis not present

## 2021-08-31 DIAGNOSIS — E559 Vitamin D deficiency, unspecified: Secondary | ICD-10-CM | POA: Diagnosis not present

## 2021-08-31 DIAGNOSIS — E119 Type 2 diabetes mellitus without complications: Secondary | ICD-10-CM | POA: Diagnosis not present

## 2021-08-31 DIAGNOSIS — E785 Hyperlipidemia, unspecified: Secondary | ICD-10-CM | POA: Diagnosis not present

## 2021-08-31 DIAGNOSIS — E039 Hypothyroidism, unspecified: Secondary | ICD-10-CM | POA: Diagnosis not present

## 2021-09-08 DIAGNOSIS — R451 Restlessness and agitation: Secondary | ICD-10-CM | POA: Diagnosis not present

## 2021-09-08 DIAGNOSIS — N390 Urinary tract infection, site not specified: Secondary | ICD-10-CM | POA: Diagnosis not present

## 2021-09-08 DIAGNOSIS — F329 Major depressive disorder, single episode, unspecified: Secondary | ICD-10-CM | POA: Diagnosis not present

## 2021-09-08 DIAGNOSIS — F5102 Adjustment insomnia: Secondary | ICD-10-CM | POA: Diagnosis not present

## 2021-09-08 DIAGNOSIS — F419 Anxiety disorder, unspecified: Secondary | ICD-10-CM | POA: Diagnosis not present

## 2021-09-08 DIAGNOSIS — R4189 Other symptoms and signs involving cognitive functions and awareness: Secondary | ICD-10-CM | POA: Diagnosis not present

## 2021-09-30 DIAGNOSIS — I248 Other forms of acute ischemic heart disease: Secondary | ICD-10-CM | POA: Diagnosis not present

## 2021-09-30 DIAGNOSIS — I10 Essential (primary) hypertension: Secondary | ICD-10-CM | POA: Diagnosis not present

## 2021-09-30 DIAGNOSIS — N400 Enlarged prostate without lower urinary tract symptoms: Secondary | ICD-10-CM | POA: Diagnosis not present

## 2021-09-30 DIAGNOSIS — I2510 Atherosclerotic heart disease of native coronary artery without angina pectoris: Secondary | ICD-10-CM | POA: Diagnosis not present

## 2021-09-30 DIAGNOSIS — F0390 Unspecified dementia without behavioral disturbance: Secondary | ICD-10-CM | POA: Diagnosis not present

## 2021-09-30 DIAGNOSIS — R079 Chest pain, unspecified: Secondary | ICD-10-CM | POA: Diagnosis not present

## 2021-09-30 DIAGNOSIS — Z794 Long term (current) use of insulin: Secondary | ICD-10-CM | POA: Diagnosis not present

## 2021-09-30 DIAGNOSIS — E785 Hyperlipidemia, unspecified: Secondary | ICD-10-CM | POA: Diagnosis not present

## 2021-09-30 DIAGNOSIS — E119 Type 2 diabetes mellitus without complications: Secondary | ICD-10-CM | POA: Diagnosis not present

## 2021-09-30 DIAGNOSIS — R0789 Other chest pain: Secondary | ICD-10-CM | POA: Diagnosis not present

## 2021-09-30 DIAGNOSIS — R778 Other specified abnormalities of plasma proteins: Secondary | ICD-10-CM | POA: Diagnosis not present

## 2021-09-30 DIAGNOSIS — D509 Iron deficiency anemia, unspecified: Secondary | ICD-10-CM | POA: Diagnosis not present

## 2021-09-30 DIAGNOSIS — Z7984 Long term (current) use of oral hypoglycemic drugs: Secondary | ICD-10-CM | POA: Diagnosis not present

## 2021-09-30 DIAGNOSIS — Z7902 Long term (current) use of antithrombotics/antiplatelets: Secondary | ICD-10-CM | POA: Diagnosis not present

## 2021-09-30 DIAGNOSIS — Z7982 Long term (current) use of aspirin: Secondary | ICD-10-CM | POA: Diagnosis not present

## 2021-10-01 DIAGNOSIS — E119 Type 2 diabetes mellitus without complications: Secondary | ICD-10-CM | POA: Diagnosis not present

## 2021-10-01 DIAGNOSIS — I2510 Atherosclerotic heart disease of native coronary artery without angina pectoris: Secondary | ICD-10-CM | POA: Diagnosis not present

## 2021-10-01 DIAGNOSIS — I10 Essential (primary) hypertension: Secondary | ICD-10-CM | POA: Diagnosis not present

## 2021-10-01 DIAGNOSIS — I4510 Unspecified right bundle-branch block: Secondary | ICD-10-CM

## 2021-10-01 DIAGNOSIS — R079 Chest pain, unspecified: Secondary | ICD-10-CM | POA: Diagnosis not present

## 2021-10-01 DIAGNOSIS — E785 Hyperlipidemia, unspecified: Secondary | ICD-10-CM | POA: Diagnosis not present

## 2021-10-01 DIAGNOSIS — R778 Other specified abnormalities of plasma proteins: Secondary | ICD-10-CM | POA: Diagnosis not present

## 2021-10-01 DIAGNOSIS — I248 Other forms of acute ischemic heart disease: Secondary | ICD-10-CM | POA: Diagnosis not present

## 2021-10-02 DIAGNOSIS — E119 Type 2 diabetes mellitus without complications: Secondary | ICD-10-CM

## 2021-10-02 DIAGNOSIS — I2510 Atherosclerotic heart disease of native coronary artery without angina pectoris: Secondary | ICD-10-CM

## 2021-10-02 DIAGNOSIS — E785 Hyperlipidemia, unspecified: Secondary | ICD-10-CM

## 2021-10-02 DIAGNOSIS — I4510 Unspecified right bundle-branch block: Secondary | ICD-10-CM | POA: Diagnosis not present

## 2021-10-06 DIAGNOSIS — F419 Anxiety disorder, unspecified: Secondary | ICD-10-CM | POA: Diagnosis not present

## 2021-10-06 DIAGNOSIS — F329 Major depressive disorder, single episode, unspecified: Secondary | ICD-10-CM | POA: Diagnosis not present

## 2021-10-06 DIAGNOSIS — R451 Restlessness and agitation: Secondary | ICD-10-CM | POA: Diagnosis not present

## 2021-10-06 DIAGNOSIS — R4189 Other symptoms and signs involving cognitive functions and awareness: Secondary | ICD-10-CM | POA: Diagnosis not present

## 2021-10-06 DIAGNOSIS — F5102 Adjustment insomnia: Secondary | ICD-10-CM | POA: Diagnosis not present

## 2021-10-24 DIAGNOSIS — I361 Nonrheumatic tricuspid (valve) insufficiency: Secondary | ICD-10-CM | POA: Diagnosis not present

## 2021-10-24 DIAGNOSIS — I4891 Unspecified atrial fibrillation: Secondary | ICD-10-CM | POA: Diagnosis not present

## 2021-10-25 DIAGNOSIS — I10 Essential (primary) hypertension: Secondary | ICD-10-CM

## 2021-10-25 DIAGNOSIS — U071 COVID-19: Secondary | ICD-10-CM

## 2021-10-25 DIAGNOSIS — E785 Hyperlipidemia, unspecified: Secondary | ICD-10-CM | POA: Diagnosis not present

## 2021-10-25 DIAGNOSIS — N179 Acute kidney failure, unspecified: Secondary | ICD-10-CM | POA: Diagnosis not present

## 2021-10-25 DIAGNOSIS — I214 Non-ST elevation (NSTEMI) myocardial infarction: Secondary | ICD-10-CM | POA: Diagnosis not present

## 2021-10-25 DIAGNOSIS — I4891 Unspecified atrial fibrillation: Secondary | ICD-10-CM | POA: Diagnosis not present

## 2021-10-25 DIAGNOSIS — E119 Type 2 diabetes mellitus without complications: Secondary | ICD-10-CM

## 2021-10-26 DIAGNOSIS — N179 Acute kidney failure, unspecified: Secondary | ICD-10-CM | POA: Diagnosis not present

## 2021-10-26 DIAGNOSIS — I214 Non-ST elevation (NSTEMI) myocardial infarction: Secondary | ICD-10-CM | POA: Diagnosis not present

## 2021-10-26 DIAGNOSIS — I4891 Unspecified atrial fibrillation: Secondary | ICD-10-CM | POA: Diagnosis not present

## 2021-10-26 DIAGNOSIS — E785 Hyperlipidemia, unspecified: Secondary | ICD-10-CM | POA: Diagnosis not present

## 2021-10-27 DIAGNOSIS — U071 COVID-19: Secondary | ICD-10-CM | POA: Diagnosis not present

## 2021-10-27 DIAGNOSIS — I214 Non-ST elevation (NSTEMI) myocardial infarction: Secondary | ICD-10-CM | POA: Diagnosis not present

## 2021-10-27 DIAGNOSIS — I4891 Unspecified atrial fibrillation: Secondary | ICD-10-CM | POA: Diagnosis not present

## 2021-11-21 NOTE — Progress Notes (Signed)
Cardiology Office Note:    Date:  11/22/2021   ID:  Frank Phillips, DOB 1946/10/27, MRN [id number]  PCP:  Myrlene Broker, MD  Cardiologist:  Shirlee More, MD    Referring MD: Myrlene Broker, MD    ASSESSMENT:    1. Coronary artery disease involving native coronary artery of native heart without angina pectoris   2. Essential hypertension   3. Hyperlipidemia LDL goal <70   4. Diabetes mellitus without complication (HCC)    PLAN:    In order of problems listed above:  Reviewed with his daughter that he might continue to have his demand ischemia in the setting of hypoxemic respiratory failure seizure and COVID-19 infection and not what I would call myocardial infarction or type II MI.  He is on good appropriate medical therapy aspirin statin low-dose beta-blocker oral nitrate and is not having angina.  Currently his care is palliative.  He is taking a rate limiting calcium channel blocker also would not reduce it to twice daily Stable reduce his calcium channel blocker Continue his statin Stable managed by his PCP   Next appointment: 9 months   Medication Adjustments/Labs and Tests Ordered: Current medicines are reviewed at length with the patient today.  Concerns regarding medicines are outlined above.  No orders of the defined types were placed in this encounter.  No orders of the defined types were placed in this encounter.   Chief Complaint  Patient presents with   Follow-up   Coronary Artery Disease    He was admitted to [facility] after his seizure with profound hypoxemia due to COVID-19 infection have elevated troponin intraoperative visit type II MI in my opinion what he had was demand ischemia    History of Present Illness:    Frank Phillips is a 76 y.o. male with a hx of CAD hypertension hyperlipidemia type 2 diabetes SVT and dementia last seen as inpatient [facility] during admission 10/24/2021 with COVID-pneumonia with hypoxemia requiring high flow  oxygen atrial fibrillation and type II myocardial infarction.  He presented with seizure activity and significant kidney dysfunction with creatinine 2.7.  He was anemic with a hemoglobin of 9 and was anticoagulated for atrial fibrillation.  Echocardiogram [facility] 08/24/2021 showed normal ventricular size mild LVH EF 55 to 60% elevated left atrial pressure mild left atrial enlargement and mild mitral regurgitation.  He was seen by his PCP 08/20/2021 chart relates he also has a history of stroke dementia cognitive difficulty and was sent assisted living facility  Compliance with diet, lifestyle and medications: Yes  Is on appropriate therapy aspirin statin beta-blocker He has had no chest pain edema shortness of breath. His daughter is present he is very apathetic.   He had a left heart catheterization performed 02/01/2021 he had severe single-vessel CAD with 95% in-stent stenosis of the proximal right coronary artery and occlusion of the mid vessel the distal vessel filled with faint collaterals he had moderate diffuse LAD and left circumflex disease up to 60 to 70% and a small caliber OM1 had a 99% ostial stenosis attempt to pass a guidewire across the proximal RCA stenosis was complicated by proximal dissection and unsuccessful. Procedures  CORONARY STENT INTERVENTION  LEFT HEART CATH AND CORONARY ANGIOGRAPHY   Conclusion  Conclusions: Severe single-vessel coronary artery disease with 95% in-stent restenosis of proximal RCA and chronic total occlusion of the mid RCA distal to takeoff of RV marginal.  The distal vessel fills via faint left-to-right collaterals. Moderate diffuse  LAD and LCx disease of up to 60-70%.  Small branch of OM1 has 99% ostial stenosis but is too small/distal for intervention. Normal left ventricular systolic function and filling pressure. Unsuccessful attempted PCI of proximal/mid RCA occlusion with failure to cross lesion with guidewire.  Intervention was  complicated by proximal RCA dissection.   Recommendations: Dual antiplatelet therapy with aspirin and clopidogrel for at least 12 months. Aggressive secondary prevention of coronary artery disease. Titrate nitroglycerin for continued relief of chest pain as well as for blood pressure control. If the patient has refractory angina, could consider PCI to OM1/OM2, though these lesions do not appear critical.  Past Medical History:  Diagnosis Date   Anxiety    Aortic atherosclerosis ([place]) 06/06/2018   Arthritis    Complication of anesthesia    " i HAD SOME KIND OF PROBLEM ,BUT I DONT REMEMBER WHAT IT WAS "   Crohn disease ([place])    Dementia ([place])    Depression    Diabetes ([place])    Fall at home 04/06/2016   High blood pressure    High cholesterol    Kidney stones    Orthostatic hypotension 03/2016   Stroke [facility])    UTI (urinary tract infection) 05/2018   WITH HEMATURIA    Past Surgical History:  Procedure Laterality Date   ANGIOPLASTY / STENTING FEMORAL     APPENDECTOMY     CORONARY STENT INTERVENTION N/A 02/01/2021   Procedure: CORONARY STENT INTERVENTION;  Surgeon: Nelva Bush, MD;  Location: [place] [facility];  Service: Cardiovascular;  Laterality: N/A;   kidney stones     LEFT HEART CATH AND CORONARY ANGIOGRAPHY N/A 02/01/2021   Procedure: LEFT HEART CATH AND CORONARY ANGIOGRAPHY;  Surgeon: Nelva Bush, MD;  Location: [place] [facility];  Service: Cardiovascular;  Laterality: N/A;   TONSILLECTOMY     TONSILLECTOMY AND ADENOIDECTOMY      Current Medications: Current Meds  Medication Sig   acetaminophen (TYLENOL) 650 MG CR tablet Take 1,300 mg by mouth every 8 (eight) hours as needed for pain.   alum & mag hydroxide-simeth (MYLANTA) 200-200-20 MG/5ML suspension Take 30 mLs by mouth every 4 (four) hours as needed for indigestion or heartburn.   APRISO 0.375 g 24 hr capsule Take 375 mg by mouth daily. pain   aspirin EC 81 MG EC tablet Take 1 tablet (81 mg total) by mouth  daily. Swallow whole.   atorvastatin (LIPITOR) 80 MG tablet Take 80 mg by mouth daily.   carvedilol (COREG) 6.25 MG tablet Take 6.25 mg by mouth 2 (two) times daily.   chlorhexidine (PERIDEX) 0.12 % solution in the morning and at bedtime.   diltiazem (CARDIZEM) 60 MG tablet Take 60 mg by mouth daily.   donepezil (ARICEPT) 10 MG tablet Take 10 mg by mouth at bedtime.   DULoxetine (CYMBALTA) 30 MG capsule Take 90 mg by mouth daily.   ELIQUIS 5 MG TABS tablet Take 5 mg by mouth 2 (two) times daily.   ferrous sulfate 325 (65 FE) MG tablet Take 325 mg by mouth daily with breakfast.   glipiZIDE (GLUCOTROL) 10 MG tablet Take 10 mg by mouth 2 (two) times daily before a meal.   insulin glargine (LANTUS) 100 UNIT/ML injection Inject 20 Units into the skin at bedtime.   isosorbide mononitrate (IMDUR) 30 MG 24 hr tablet Take 30 mg by mouth daily. Hold if BP is less than 100   Magnesium Oxide 400 MG CAPS Take 1 capsule (400 mg total)  by mouth at bedtime.   melatonin 5 MG TABS Take 5 mg by mouth at bedtime.   metFORMIN (GLUCOPHAGE) 500 MG tablet Take 1 tablet by mouth 2 (two) times daily.   nitroGLYCERIN (NITROSTAT) 0.4 MG SL tablet Place 1 tablet (0.4 mg total) under the tongue every 5 (five) minutes x 3 doses as needed for chest pain.   pantoprazole (PROTONIX) 40 MG tablet Take 40 mg by mouth daily.    tamsulosin (FLOMAX) 0.4 MG CAPS capsule Take 0.4 mg by mouth daily.   temazepam (RESTORIL) 30 MG capsule Take 30 mg by mouth at bedtime as needed for sleep.     Allergies:   Levaquin [levofloxacin]   Social History   Socioeconomic History   Marital status: Married    Spouse name: Not on file   Number of children: Not on file   Years of education: Not on file   Highest education level: Not on file  Occupational History   Occupation: Retired    Comment: Writer  Tobacco Use   Smoking status: Never    Passive exposure: Never   Smokeless tobacco: Never  Vaping Use   Vaping Use: Never used   Substance and Sexual Activity   Alcohol use: No   Drug use: No   Sexual activity: Not on file  Other Topics Concern   Not on file  Social History Narrative   Not on file   Social Determinants of Health   Financial Resource Strain: Not on file  Food Insecurity: Not on file  Transportation Needs: Not on file  Physical Activity: Not on file  Stress: Not on file  Social Connections: Not on file     Family History: The patient's family history includes Cancer in his sister; Heart disease in his father and mother. ROS:   Please see the history of present illness.    All other systems reviewed and are negative.  EKGs/Labs/Other Studies Reviewed:    The following studies were reviewed today:  EKG:  EKG ordered today and personally reviewed.  The ekg ordered today demonstrates   Recent Labs: 02/01/2021: ALT 20; Magnesium 1.5; TSH 2.557 02/02/2021: Hemoglobin 10.1; Platelets 178 02/03/2021: BUN 9; Creatinine, Ser 0.83; Potassium 3.7; Sodium 137  Recent Lipid Panel No results found for: CHOL, TRIG, HDL, CHOLHDL, VLDL, LDLCALC, LDLDIRECT  Physical Exam:    VS:  BP (!) 112/50 (BP Location: Left Arm)    Pulse (!) 54    Ht 5' 8"  (1.727 m)    Wt 177 lb 9.6 oz (80.6 kg)    SpO2 95%    BMI 27.00 kg/m     Wt Readings from Last 3 Encounters:  11/22/21 177 lb 9.6 oz (80.6 kg)  02/15/21 176 lb (79.8 kg)  02/01/21 172 lb 12.8 oz (78.4 kg)     GEN:  Well nourished, well developed in no acute distress HEENT: Normal NECK: No JVD; No carotid bruits LYMPHATICS: No lymphadenopathy CARDIAC: RRR, no murmurs, rubs, gallops RESPIRATORY:  Clear to auscultation without rales, wheezing or rhonchi  ABDOMEN: Soft, non-tender, non-distended MUSCULOSKELETAL:  No edema; No deformity  SKIN: Warm and dry NEUROLOGIC:  Alert and oriented x 3 PSYCHIATRIC:  Normal affect    Signed, Shirlee More, MD  11/22/2021 10:46 AM    [place]

## 2021-11-22 ENCOUNTER — Other Ambulatory Visit: Payer: Self-pay

## 2021-11-22 ENCOUNTER — Encounter: Payer: Self-pay | Admitting: Cardiology

## 2021-11-22 ENCOUNTER — Ambulatory Visit: Payer: PPO | Admitting: Cardiology

## 2021-11-22 VITALS — BP 112/50 | HR 54 | Ht 68.0 in | Wt 177.6 lb

## 2021-11-22 DIAGNOSIS — E785 Hyperlipidemia, unspecified: Secondary | ICD-10-CM

## 2021-11-22 DIAGNOSIS — I10 Essential (primary) hypertension: Secondary | ICD-10-CM

## 2021-11-22 DIAGNOSIS — E119 Type 2 diabetes mellitus without complications: Secondary | ICD-10-CM | POA: Diagnosis not present

## 2021-11-22 DIAGNOSIS — I2510 Atherosclerotic heart disease of native coronary artery without angina pectoris: Secondary | ICD-10-CM | POA: Diagnosis not present

## 2021-11-22 NOTE — Patient Instructions (Addendum)
Medication Instructions:  Your physician has recommended you make the following change in your medication:  DECREASE: Diltiazem to 60 mg twice daily.   *If you need a refill on your cardiac medications before your next appointment, please call your pharmacy*   Lab Work: None If you have labs (blood work) drawn today and your tests are completely normal, you will receive your results only by: MyChart Message (if you have MyChart) OR A paper copy in the mail If you have any lab test that is abnormal or we need to change your treatment, we will call you to review the results.   Testing/Procedures: None   Follow-Up: At [facility], you and your health needs are our priority.  As part of our continuing mission to provide you with exceptional heart care, we have created designated Provider Care Teams.  These Care Teams include your primary Cardiologist (physician) and Advanced Practice Providers (APPs -  Physician Assistants and Nurse Practitioners) who all work together to provide you with the care you need, when you need it.  We recommend signing up for the patient portal called "MyChart".  Sign up information is provided on this After Visit Summary.  MyChart is used to connect with patients for Virtual Visits (Telemedicine).  Patients are able to view lab/test results, encounter notes, upcoming appointments, etc.  Non-urgent messages can be sent to your provider as well.   To learn more about what you can do with MyChart, go to [email or web address].    Your next appointment:   9 month(s)  The format for your next appointment:   In Person  Provider:   Norman Herrlich, MD    Other Instructions

## 2022-04-27 ENCOUNTER — Ambulatory Visit (INDEPENDENT_AMBULATORY_CARE_PROVIDER_SITE_OTHER): Payer: PPO | Admitting: Family Medicine

## 2022-04-27 ENCOUNTER — Encounter: Payer: Self-pay | Admitting: Family Medicine

## 2022-04-27 VITALS — BP 124/88 | HR 49 | Temp 96.9°F | Ht 68.0 in | Wt 162.4 lb

## 2022-04-27 DIAGNOSIS — E785 Hyperlipidemia, unspecified: Secondary | ICD-10-CM

## 2022-04-27 DIAGNOSIS — I214 Non-ST elevation (NSTEMI) myocardial infarction: Secondary | ICD-10-CM

## 2022-04-27 DIAGNOSIS — I700 Atherosclerosis of aorta: Secondary | ICD-10-CM | POA: Diagnosis not present

## 2022-04-27 DIAGNOSIS — E119 Type 2 diabetes mellitus without complications: Secondary | ICD-10-CM

## 2022-04-27 DIAGNOSIS — I10 Essential (primary) hypertension: Secondary | ICD-10-CM

## 2022-04-27 DIAGNOSIS — F32A Depression, unspecified: Secondary | ICD-10-CM | POA: Diagnosis not present

## 2022-04-27 NOTE — Progress Notes (Signed)
Provider:  Alain Honey, MD  Careteam: Patient Care Team: Myrlene Broker, MD as PCP - General (Family Medicine) Berniece Salines, DO as PCP - Cardiology (Cardiology)  PLACE OF SERVICE:  [place] Directive information    Allergies  Allergen Reactions   Levaquin [Levofloxacin]     Out of mind    Chief Complaint  Patient presents with   New Patient (Initial Visit)    Patient presents today for a new patient appointment.     HPI: Patient is a 76 y.o. male new patient who resides in assisted living in [place].  Accompanied by daughter.  Patient is not very communicative and offers little in the way of history which is obtained mostly from daughter and review of some notes that are found within epic.  He has a history of aortic atherosclerosis depression anxiety dementia diabetes hypertension, hyperlipidemia.  There may be a history of bipolar disease to although that is unclear.  He is however on combination lithium and antidepressant which is suspicious for bipolar. We reviewed his medicines in detail.  He is on polypharmacy and I think we can safely stop some of these medicines but will need an order sheet from the facility where he lives.  Review of Systems:  Review of Systems  Constitutional:  Positive for malaise/fatigue and weight loss.  HENT:  Positive for hearing loss.   Eyes: Negative.   Respiratory: Negative.    Cardiovascular:  Negative for chest pain.  Gastrointestinal: Negative.   Genitourinary:  Positive for frequency.  Musculoskeletal:  Positive for back pain.  Psychiatric/Behavioral:  Positive for depression and memory loss. The patient has insomnia.   All other systems reviewed and are negative.   Past Medical History:  Diagnosis Date   Anxiety    Aortic atherosclerosis ([place]) 06/06/2018   Arthritis    Complication of anesthesia    " i HAD SOME KIND OF PROBLEM ,BUT I DONT REMEMBER WHAT IT WAS "   Crohn disease ([place])    Dementia ([place])     Depression    Diabetes ([place])    Fall at home 04/06/2016   High blood pressure    High cholesterol    Kidney stones    Orthostatic hypotension 03/2016   Stroke [facility])    UTI (urinary tract infection) 05/2018   WITH HEMATURIA   Past Surgical History:  Procedure Laterality Date   ANGIOPLASTY / STENTING FEMORAL     APPENDECTOMY     CORONARY STENT INTERVENTION N/A 02/01/2021   Procedure: CORONARY STENT INTERVENTION;  Surgeon: Nelva Bush, MD;  Location: [place] [facility];  Service: Cardiovascular;  Laterality: N/A;   kidney stones     LEFT HEART CATH AND CORONARY ANGIOGRAPHY N/A 02/01/2021   Procedure: LEFT HEART CATH AND CORONARY ANGIOGRAPHY;  Surgeon: Nelva Bush, MD;  Location: [place] [facility];  Service: Cardiovascular;  Laterality: N/A;   TONSILLECTOMY     TONSILLECTOMY AND ADENOIDECTOMY     Social History:   reports that he has never smoked. He has never been exposed to tobacco smoke. He has never used smokeless tobacco. He reports that he does not drink alcohol and does not use drugs.  Family History  Problem Relation Age of Onset   Cancer Sister        skin   Heart disease Mother    Heart disease Father     Medications: Patient's Medications  New Prescriptions   No medications on file  Previous Medications  ACETAMINOPHEN (TYLENOL) 650 MG CR TABLET    Take 1,300 mg by mouth every 8 (eight) hours as needed for pain.   ALUM & MAG HYDROXIDE-SIMETH (MYLANTA) 174-081-44 MG/5ML SUSPENSION    Take 30 mLs by mouth every 4 (four) hours as needed for indigestion or heartburn.   APRISO 0.375 G 24 HR CAPSULE    Take 375 mg by mouth daily. pain   ASPIRIN EC 81 MG EC TABLET    Take 1 tablet (81 mg total) by mouth daily. Swallow whole.   ATORVASTATIN (LIPITOR) 80 MG TABLET    Take 80 mg by mouth daily.   CARVEDILOL (COREG) 6.25 MG TABLET    Take 6.25 mg by mouth 2 (two) times daily.   CHLORHEXIDINE (PERIDEX) 0.12 % SOLUTION    in the morning and at bedtime.   DILTIAZEM  (CARDIZEM) 60 MG TABLET    Take 60 mg by mouth 2 (two) times daily.   DONEPEZIL (ARICEPT) 10 MG TABLET    Take 10 mg by mouth at bedtime.   DULOXETINE (CYMBALTA) 30 MG CAPSULE    Take 90 mg by mouth daily.   ELIQUIS 5 MG TABS TABLET    Take 5 mg by mouth 2 (two) times daily.   FERROUS SULFATE 325 (65 FE) MG TABLET    Take 325 mg by mouth daily with breakfast.   GLIPIZIDE (GLUCOTROL) 10 MG TABLET    Take 10 mg by mouth 2 (two) times daily before a meal.   INSULIN GLARGINE (LANTUS) 100 UNIT/ML INJECTION    Inject 20 Units into the skin at bedtime.   ISOSORBIDE MONONITRATE (IMDUR) 30 MG 24 HR TABLET    Take 30 mg by mouth daily. Hold if BP is less than 100   LORAZEPAM (ATIVAN) 2 MG TABLET    Take 2 mg by mouth as needed for anxiety. Prior to dentist appointment   MAGNESIUM OXIDE 400 MG CAPS    Take 1 capsule (400 mg total) by mouth at bedtime.   MELATONIN 5 MG TABS    Take 5 mg by mouth at bedtime.   METFORMIN (GLUCOPHAGE) 500 MG TABLET    Take 1 tablet by mouth 2 (two) times daily.   NITROGLYCERIN (NITROSTAT) 0.4 MG SL TABLET    Place 1 tablet (0.4 mg total) under the tongue every 5 (five) minutes x 3 doses as needed for chest pain.   PANTOPRAZOLE (PROTONIX) 40 MG TABLET    Take 40 mg by mouth daily.    TAMSULOSIN (FLOMAX) 0.4 MG CAPS CAPSULE    Take 0.4 mg by mouth daily.   TEMAZEPAM (RESTORIL) 30 MG CAPSULE    Take 30 mg by mouth at bedtime as needed for sleep.  Modified Medications   No medications on file  Discontinued Medications   No medications on file    Physical Exam:  Vitals:   04/27/22 1021  BP: 124/88  Pulse: (!) 49  Temp: (!) 96.9 F (36.1 C)  SpO2: 98%  Weight: 162 lb 6.4 oz (73.7 kg)  Height: 5' 8"  (1.727 m)   Body mass index is 24.69 kg/m. Wt Readings from Last 3 Encounters:  04/27/22 162 lb 6.4 oz (73.7 kg)  11/22/21 177 lb 9.6 oz (80.6 kg)  02/15/21 176 lb (79.8 kg)    Physical Exam Vitals and nursing note reviewed.  Constitutional:      Appearance: Normal  appearance.  HENT:     Head: Normocephalic.  Cardiovascular:     Rate and Rhythm: Normal  rate. Rhythm irregular.     Pulses: Normal pulses.  Pulmonary:     Effort: Pulmonary effort is normal.     Breath sounds: Normal breath sounds.  Abdominal:     General: Abdomen is flat. Bowel sounds are normal.  Neurological:     General: No focal deficit present.     Mental Status: He is alert and oriented to person, place, and time.     Comments: Patient had slums test and scored 21 suggesting he has a mild neurocognitive disorder  Psychiatric:        Mood and Affect: Mood normal.        Behavior: Behavior normal.     Labs reviewed: Basic Metabolic Panel: No results for input(s): "NA", "K", "CL", "CO2", "GLUCOSE", "BUN", "CREATININE", "CALCIUM", "MG", "PHOS", "TSH" in the last 8760 hours. Liver Function Tests: No results for input(s): "AST", "ALT", "ALKPHOS", "BILITOT", "PROT", "ALBUMIN" in the last 8760 hours. No results for input(s): "LIPASE", "AMYLASE" in the last 8760 hours. No results for input(s): "AMMONIA" in the last 8760 hours. CBC: No results for input(s): "WBC", "NEUTROABS", "HGB", "HCT", "MCV", "PLT" in the last 8760 hours. Lipid Panel: No results for input(s): "CHOL", "HDL", "LDLCALC", "TRIG", "CHOLHDL", "LDLDIRECT" in the last 8760 hours. TSH: No results for input(s): "TSH" in the last 8760 hours. A1C: Lab Results  Component Value Date   HGBA1C 7.0 (H) 02/01/2021     Assessment/Plan  1. Diabetes mellitus without complication (HCC) Last J0L was 8 months ago and was 6.8.  Continue with Lantus and Glucophage and glipizide although would like to discontinue the latter  2. Hyperlipidemia LDL goal <70 Lipids checked about 1 year ago LDL was at goal at that time  3. Aortic atherosclerosis (HCC) Pulses are present distally.  Continue with statin and aggressive treatment of blood pressure and diabetes  4. Depression, unspecified depression type Continue with Cymbalta  and lithium.  Patient does appear depressed.  There is general apathy and unhappiness in his living situation.  Wife died about 3 years ago and since then he has been in 59 different assisted living's.  And does not always get along with other residents  5. Essential hypertension blood pressure good at 124/88 on combination beta-blocker and calcium channel blocker  6. NSTEMI (non-ST elevated myocardial infarction) ([place]) Continues on Imdur as well as beta-blocker.  Denies chest pain   Alain Honey, MD [place] [phone]

## 2022-04-27 NOTE — Patient Instructions (Addendum)
Meds we will consider tapering or discontinuing include glipizide, donepezil, and tamsulosin  Order sheet can be faxed to [phone]

## 2022-04-28 LAB — COMPLETE METABOLIC PANEL WITH GFR
AG Ratio: 1.8 (calc) (ref 1.0–2.5)
ALT: 11 U/L (ref 9–46)
AST: 13 U/L (ref 10–35)
Albumin: 3.9 g/dL (ref 3.6–5.1)
Alkaline phosphatase (APISO): 87 U/L (ref 35–144)
BUN: 15 mg/dL (ref 7–25)
CO2: 26 mmol/L (ref 20–32)
Calcium: 9.4 mg/dL (ref 8.6–10.3)
Chloride: 104 mmol/L (ref 98–110)
Creat: 0.89 mg/dL (ref 0.70–1.28)
Globulin: 2.2 g/dL (calc) (ref 1.9–3.7)
Glucose, Bld: 167 mg/dL — ABNORMAL HIGH (ref 65–139)
Potassium: 4 mmol/L (ref 3.5–5.3)
Sodium: 140 mmol/L (ref 135–146)
Total Bilirubin: 0.5 mg/dL (ref 0.2–1.2)
Total Protein: 6.1 g/dL (ref 6.1–8.1)
eGFR: 88 mL/min/{1.73_m2} (ref >=60)

## 2022-04-28 LAB — LIPID PANEL
Cholesterol: 84 mg/dL (ref <200)
HDL: 26 mg/dL — ABNORMAL LOW (ref >=40)
LDL Cholesterol (Calc): 35 mg/dL (calc)
Non-HDL Cholesterol (Calc): 58 mg/dL (calc) (ref <130)
Total CHOL/HDL Ratio: 3.2 (calc) (ref <5.0)
Triglycerides: 146 mg/dL (ref <150)

## 2022-04-28 LAB — HEMOGLOBIN A1C
Hgb A1c MFr Bld: 5.3 % of total Hgb (ref <5.7)
Mean Plasma Glucose: 105 mg/dL
eAG (mmol/L): 5.8 mmol/L

## 2022-07-27 ENCOUNTER — Ambulatory Visit: Payer: PPO | Admitting: Family Medicine

## 2022-11-08 DIAGNOSIS — M1990 Unspecified osteoarthritis, unspecified site: Secondary | ICD-10-CM | POA: Insufficient documentation

## 2022-11-08 DIAGNOSIS — F0390 Unspecified dementia without behavioral disturbance: Secondary | ICD-10-CM | POA: Insufficient documentation

## 2022-11-08 DIAGNOSIS — Y92009 Unspecified place in unspecified non-institutional (private) residence as the place of occurrence of the external cause: Secondary | ICD-10-CM | POA: Insufficient documentation

## 2022-11-08 DIAGNOSIS — F419 Anxiety disorder, unspecified: Secondary | ICD-10-CM | POA: Insufficient documentation

## 2022-11-08 DIAGNOSIS — K5090 Crohn's disease, unspecified, without complications: Secondary | ICD-10-CM | POA: Insufficient documentation

## 2022-11-08 DIAGNOSIS — N200 Calculus of kidney: Secondary | ICD-10-CM | POA: Insufficient documentation

## 2022-11-08 DIAGNOSIS — T8859XA Other complications of anesthesia, initial encounter: Secondary | ICD-10-CM | POA: Insufficient documentation

## 2022-11-08 NOTE — Progress Notes (Signed)
Cardiology Office Note:    Date:  11/08/2022   ID:  Frank Phillips, DOB 01-03-1946, MRN [id number]  PCP:  Garwin Brothers, MD  Cardiologist:  Shirlee More, MD    Referring MD: Myrlene Broker, MD    ASSESSMENT:    No diagnosis found. PLAN:    In order of problems listed above:  ***   Next appointment: ***   Medication Adjustments/Labs and Tests Ordered: Current medicines are reviewed at length with the patient today.  Concerns regarding medicines are outlined above.  No orders of the defined types were placed in this encounter.  No orders of the defined types were placed in this encounter.   No chief complaint on file.   History of Present Illness:    Frank Phillips is a 77 y.o. male with a hx of coronary artery disease SVT hypertension hyperlipidemia dementia and type 2 diabetes last seen 11/22/2021. Compliance with diet, lifestyle and medications: ***  Echocardiogram [facility] 08/24/2021 showed normal ventricular size mild LVH EF 55 to 60% elevated left atrial pressure mild left atrial enlargement and mild mitral regurgitation  He had a left heart catheterization performed 02/01/2021 he had severe single-vessel CAD with 95% in-stent stenosis of the proximal right coronary artery and occlusion of the mid vessel the distal vessel filled with faint collaterals he had moderate diffuse LAD and left circumflex disease up to 60 to 70% and a small caliber OM1 had a 99% ostial stenosis attempt to pass a guidewire across the proximal RCA stenosis was complicated by proximal dissection and unsuccessful. Procedures   CORONARY STENT INTERVENTION  LEFT HEART CATH AND CORONARY ANGIOGRAPHY    Conclusion   Conclusions: Severe single-vessel coronary artery disease with 95% in-stent restenosis of proximal RCA and chronic total occlusion of the mid RCA distal to takeoff of RV marginal.  The distal vessel fills via faint left-to-right collaterals. Moderate diffuse LAD and LCx disease of up  to 60-70%.  Small branch of OM1 has 99% ostial stenosis but is too small/distal for intervention. Normal left ventricular systolic function and filling pressure. Unsuccessful attempted PCI of proximal/mid RCA occlusion with failure to cross lesion with guidewire.  Intervention was complicated by proximal RCA dissection.   Recommendations: Dual antiplatelet therapy with aspirin and clopidogrel for at least 12 months. Aggressive secondary prevention of coronary artery disease. Titrate nitroglycerin for continued relief of chest pain as well as for blood pressure control. If the patient has refractory angina, could consider PCI to OM1/OM2, though these lesions do not appear critical. Past Medical History:  Diagnosis Date   Anxiety    Aortic atherosclerosis ([place]) 06/06/2018   Arthritis    Complication of anesthesia    " i HAD SOME KIND OF PROBLEM ,BUT I DONT REMEMBER WHAT IT WAS "   Crohn disease ([place])    Dementia ([place])    Depression    Diabetes ([place])    Fall at home 04/06/2016   High blood pressure    High cholesterol    Kidney stones    Orthostatic hypotension 03/2016   Stroke [facility])    UTI (urinary tract infection) 05/2018   WITH HEMATURIA    Past Surgical History:  Procedure Laterality Date   ANGIOPLASTY / STENTING FEMORAL     APPENDECTOMY     CORONARY STENT INTERVENTION N/A 02/01/2021   Procedure: CORONARY STENT INTERVENTION;  Surgeon: Nelva Bush, MD;  Location: [place] [facility];  Service: Cardiovascular;  Laterality: N/A;   kidney stones  LEFT HEART CATH AND CORONARY ANGIOGRAPHY N/A 02/01/2021   Procedure: LEFT HEART CATH AND CORONARY ANGIOGRAPHY;  Surgeon: Yvonne Kendall, MD;  Location: [facility];  Service: Cardiovascular;  Laterality: N/A;   TONSILLECTOMY     TONSILLECTOMY AND ADENOIDECTOMY      Current Medications: No outpatient medications have been marked as taking for the 11/09/22 encounter (Appointment) with Baldo Daub, MD.     Allergies:    Levaquin [levofloxacin]   Social History   Socioeconomic History   Marital status: Married    Spouse name: Not on file   Number of children: Not on file   Years of education: Not on file   Highest education level: Not on file  Occupational History   Occupation: Retired    Comment: machine shop  Tobacco Use   Smoking status: Never    Passive exposure: Never   Smokeless tobacco: Never  Vaping Use   Vaping Use: Never used  Substance and Sexual Activity   Alcohol use: No   Drug use: No   Sexual activity: Not on file  Other Topics Concern   Not on file  Social History Narrative   Not on file   Social Determinants of Health   Financial Resource Strain: Not on file  Food Insecurity: Not on file  Transportation Needs: Not on file  Physical Activity: Not on file  Stress: Not on file  Social Connections: Not on file     Family History: The patient's ***family history includes Cancer in his sister; Heart disease in his father and mother. ROS:   Please see the history of present illness.    All other systems reviewed and are negative.  EKGs/Labs/Other Studies Reviewed:    The following studies were reviewed today:  EKG:  EKG ordered today and personally reviewed.  The ekg ordered today demonstrates ***  Recent Labs: 04/27/2022: ALT 11; BUN 15; Creat 0.89; Potassium 4.0; Sodium 140  Recent Lipid Panel    Component Value Date/Time   CHOL 84 04/27/2022 1151   TRIG 146 04/27/2022 1151   HDL 26 (L) 04/27/2022 1151   CHOLHDL 3.2 04/27/2022 1151   LDLCALC 35 04/27/2022 1151    Physical Exam:    VS:  There were no vitals taken for this visit.    Wt Readings from Last 3 Encounters:  04/27/22 162 lb 6.4 oz (73.7 kg)  11/22/21 177 lb 9.6 oz (80.6 kg)  02/15/21 176 lb (79.8 kg)     GEN: *** Well nourished, well developed in no acute distress HEENT: Normal NECK: No JVD; No carotid bruits LYMPHATICS: No lymphadenopathy CARDIAC: ***RRR, no murmurs, rubs,  gallops RESPIRATORY:  Clear to auscultation without rales, wheezing or rhonchi  ABDOMEN: Soft, non-tender, non-distended MUSCULOSKELETAL:  No edema; No deformity  SKIN: Warm and dry NEUROLOGIC:  Alert and oriented x 3 PSYCHIATRIC:  Normal affect    Signed, Norman Herrlich, MD  11/08/2022 5:32 PM    [facility]

## 2022-11-09 ENCOUNTER — Ambulatory Visit: Payer: PPO | Attending: Cardiology | Admitting: Cardiology

## 2022-11-09 ENCOUNTER — Encounter: Payer: Self-pay | Admitting: Cardiology

## 2022-11-09 VITALS — BP 133/74 | HR 60 | Ht 68.0 in | Wt 158.0 lb

## 2022-11-09 DIAGNOSIS — I10 Essential (primary) hypertension: Secondary | ICD-10-CM | POA: Diagnosis not present

## 2022-11-09 DIAGNOSIS — E785 Hyperlipidemia, unspecified: Secondary | ICD-10-CM

## 2022-11-09 DIAGNOSIS — I2510 Atherosclerotic heart disease of native coronary artery without angina pectoris: Secondary | ICD-10-CM

## 2022-11-09 NOTE — Patient Instructions (Signed)
Medication Instructions:  Your physician recommends that you continue on your current medications as directed. Please refer to the Current Medication list given to you today.  *If you need a refill on your cardiac medications before your next appointment, please call your pharmacy*   Lab Work: None If you have labs (blood work) drawn today and your tests are completely normal, you will receive your results only by: MyChart Message (if you have MyChart) OR A paper copy in the mail If you have any lab test that is abnormal or we need to change your treatment, we will call you to review the results.   Testing/Procedures: None   Follow-Up: At [facility], you and your health needs are our priority.  As part of our continuing mission to provide you with exceptional heart care, we have created designated Provider Care Teams.  These Care Teams include your primary Cardiologist (physician) and Advanced Practice Providers (APPs -  Physician Assistants and Nurse Practitioners) who all work together to provide you with the care you need, when you need it.  We recommend signing up for the patient portal called "MyChart".  Sign up information is provided on this After Visit Summary.  MyChart is used to connect with patients for Virtual Visits (Telemedicine).  Patients are able to view lab/test results, encounter notes, upcoming appointments, etc.  Non-urgent messages can be sent to your provider as well.   To learn more about what you can do with MyChart, go to [email or web address].    Your next appointment:   1 year(s)  Provider:   Brian Munley, MD    Other Instructions None  

## 2023-01-04 DIAGNOSIS — I4891 Unspecified atrial fibrillation: Secondary | ICD-10-CM

## 2023-01-04 DIAGNOSIS — R269 Unspecified abnormalities of gait and mobility: Secondary | ICD-10-CM

## 2023-01-04 DIAGNOSIS — R569 Unspecified convulsions: Secondary | ICD-10-CM

## 2023-01-04 DIAGNOSIS — E1159 Type 2 diabetes mellitus with other circulatory complications: Secondary | ICD-10-CM

## 2023-01-04 DIAGNOSIS — I2510 Atherosclerotic heart disease of native coronary artery without angina pectoris: Secondary | ICD-10-CM

## 2023-01-04 DIAGNOSIS — M6281 Muscle weakness (generalized): Secondary | ICD-10-CM

## 2023-01-04 DIAGNOSIS — Z6823 Body mass index (BMI) 23.0-23.9, adult: Secondary | ICD-10-CM

## 2023-01-04 DIAGNOSIS — D509 Iron deficiency anemia, unspecified: Secondary | ICD-10-CM

## 2023-01-04 DIAGNOSIS — F0390 Unspecified dementia without behavioral disturbance: Secondary | ICD-10-CM

## 2023-01-04 DIAGNOSIS — I119 Hypertensive heart disease without heart failure: Secondary | ICD-10-CM

## 2023-02-16 DIAGNOSIS — F0390 Unspecified dementia without behavioral disturbance: Secondary | ICD-10-CM

## 2023-02-16 DIAGNOSIS — K91840 Postprocedural hemorrhage and hematoma of a digestive system organ or structure following a digestive system procedure: Secondary | ICD-10-CM

## 2023-02-16 DIAGNOSIS — R52 Pain, unspecified: Secondary | ICD-10-CM

## 2023-02-16 DIAGNOSIS — I4891 Unspecified atrial fibrillation: Secondary | ICD-10-CM

## 2023-02-16 DIAGNOSIS — R6884 Jaw pain: Secondary | ICD-10-CM

## 2023-02-17 DIAGNOSIS — S0083XA Contusion of other part of head, initial encounter: Secondary | ICD-10-CM

## 2023-02-17 DIAGNOSIS — I4891 Unspecified atrial fibrillation: Secondary | ICD-10-CM

## 2023-02-17 DIAGNOSIS — Z7901 Long term (current) use of anticoagulants: Secondary | ICD-10-CM | POA: Diagnosis not present

## 2023-02-17 DIAGNOSIS — F0390 Unspecified dementia without behavioral disturbance: Secondary | ICD-10-CM | POA: Diagnosis not present

## 2023-02-20 DIAGNOSIS — I4891 Unspecified atrial fibrillation: Secondary | ICD-10-CM | POA: Diagnosis not present

## 2023-02-20 DIAGNOSIS — K91840 Postprocedural hemorrhage and hematoma of a digestive system organ or structure following a digestive system procedure: Secondary | ICD-10-CM | POA: Diagnosis not present

## 2023-02-20 DIAGNOSIS — R041 Hemorrhage from throat: Secondary | ICD-10-CM | POA: Diagnosis not present

## 2023-02-22 DIAGNOSIS — F0390 Unspecified dementia without behavioral disturbance: Secondary | ICD-10-CM

## 2023-02-22 DIAGNOSIS — K91840 Postprocedural hemorrhage and hematoma of a digestive system organ or structure following a digestive system procedure: Secondary | ICD-10-CM

## 2023-02-22 DIAGNOSIS — R041 Hemorrhage from throat: Secondary | ICD-10-CM | POA: Diagnosis not present

## 2023-02-22 DIAGNOSIS — R451 Restlessness and agitation: Secondary | ICD-10-CM

## 2023-04-12 DIAGNOSIS — T551X1A Toxic effect of detergents, accidental (unintentional), initial encounter: Secondary | ICD-10-CM

## 2023-04-12 DIAGNOSIS — F0390 Unspecified dementia without behavioral disturbance: Secondary | ICD-10-CM

## 2023-04-12 DIAGNOSIS — R110 Nausea: Secondary | ICD-10-CM

## 2023-05-18 DIAGNOSIS — R569 Unspecified convulsions: Secondary | ICD-10-CM

## 2023-05-18 DIAGNOSIS — F0390 Unspecified dementia without behavioral disturbance: Secondary | ICD-10-CM

## 2023-05-18 DIAGNOSIS — I4891 Unspecified atrial fibrillation: Secondary | ICD-10-CM

## 2023-05-18 DIAGNOSIS — E785 Hyperlipidemia, unspecified: Secondary | ICD-10-CM

## 2023-05-18 DIAGNOSIS — I119 Hypertensive heart disease without heart failure: Secondary | ICD-10-CM

## 2023-05-18 DIAGNOSIS — I2510 Atherosclerotic heart disease of native coronary artery without angina pectoris: Secondary | ICD-10-CM | POA: Diagnosis not present

## 2023-05-18 DIAGNOSIS — E1159 Type 2 diabetes mellitus with other circulatory complications: Secondary | ICD-10-CM

## 2023-06-21 DIAGNOSIS — F330 Major depressive disorder, recurrent, mild: Secondary | ICD-10-CM

## 2023-06-21 DIAGNOSIS — F0390 Unspecified dementia without behavioral disturbance: Secondary | ICD-10-CM

## 2023-06-21 DIAGNOSIS — R799 Abnormal finding of blood chemistry, unspecified: Secondary | ICD-10-CM

## 2023-06-21 DIAGNOSIS — E8809 Other disorders of plasma-protein metabolism, not elsewhere classified: Secondary | ICD-10-CM

## 2023-06-21 DIAGNOSIS — R770 Abnormality of albumin: Secondary | ICD-10-CM

## 2023-08-04 DIAGNOSIS — Z23 Encounter for immunization: Secondary | ICD-10-CM

## 2023-08-09 DIAGNOSIS — F0390 Unspecified dementia without behavioral disturbance: Secondary | ICD-10-CM

## 2023-08-09 DIAGNOSIS — K219 Gastro-esophageal reflux disease without esophagitis: Secondary | ICD-10-CM

## 2023-09-13 DIAGNOSIS — F0390 Unspecified dementia without behavioral disturbance: Secondary | ICD-10-CM

## 2023-09-13 DIAGNOSIS — R059 Cough, unspecified: Secondary | ICD-10-CM | POA: Diagnosis not present

## 2023-09-13 DIAGNOSIS — G894 Chronic pain syndrome: Secondary | ICD-10-CM | POA: Diagnosis not present

## 2023-09-13 DIAGNOSIS — M1990 Unspecified osteoarthritis, unspecified site: Secondary | ICD-10-CM

## 2023-11-17 DIAGNOSIS — F0390 Unspecified dementia without behavioral disturbance: Secondary | ICD-10-CM | POA: Diagnosis not present

## 2023-11-17 DIAGNOSIS — M6281 Muscle weakness (generalized): Secondary | ICD-10-CM | POA: Diagnosis not present

## 2023-11-17 DIAGNOSIS — Z9181 History of falling: Secondary | ICD-10-CM | POA: Diagnosis not present

## 2023-11-17 DIAGNOSIS — R2689 Other abnormalities of gait and mobility: Secondary | ICD-10-CM | POA: Diagnosis not present

## 2023-11-24 DIAGNOSIS — N400 Enlarged prostate without lower urinary tract symptoms: Secondary | ICD-10-CM | POA: Diagnosis not present

## 2023-11-24 DIAGNOSIS — K219 Gastro-esophageal reflux disease without esophagitis: Secondary | ICD-10-CM

## 2023-11-24 DIAGNOSIS — M1990 Unspecified osteoarthritis, unspecified site: Secondary | ICD-10-CM | POA: Diagnosis not present

## 2023-11-24 DIAGNOSIS — R569 Unspecified convulsions: Secondary | ICD-10-CM

## 2023-11-24 DIAGNOSIS — F0390 Unspecified dementia without behavioral disturbance: Secondary | ICD-10-CM

## 2023-11-24 DIAGNOSIS — I4891 Unspecified atrial fibrillation: Secondary | ICD-10-CM | POA: Diagnosis not present

## 2023-11-24 DIAGNOSIS — I2510 Atherosclerotic heart disease of native coronary artery without angina pectoris: Secondary | ICD-10-CM | POA: Diagnosis not present

## 2023-11-29 DIAGNOSIS — F0390 Unspecified dementia without behavioral disturbance: Secondary | ICD-10-CM

## 2023-11-29 DIAGNOSIS — E8809 Other disorders of plasma-protein metabolism, not elsewhere classified: Secondary | ICD-10-CM | POA: Diagnosis not present

## 2023-11-29 DIAGNOSIS — E785 Hyperlipidemia, unspecified: Secondary | ICD-10-CM | POA: Diagnosis not present

## 2023-11-29 DIAGNOSIS — R770 Abnormality of albumin: Secondary | ICD-10-CM | POA: Diagnosis not present

## 2023-11-29 DIAGNOSIS — D509 Iron deficiency anemia, unspecified: Secondary | ICD-10-CM | POA: Diagnosis not present

## 2024-01-08 DIAGNOSIS — R2689 Other abnormalities of gait and mobility: Secondary | ICD-10-CM | POA: Diagnosis not present

## 2024-01-08 DIAGNOSIS — M6281 Muscle weakness (generalized): Secondary | ICD-10-CM | POA: Diagnosis not present

## 2024-01-08 DIAGNOSIS — F0390 Unspecified dementia without behavioral disturbance: Secondary | ICD-10-CM | POA: Diagnosis not present

## 2024-01-08 DIAGNOSIS — Z9181 History of falling: Secondary | ICD-10-CM | POA: Diagnosis not present

## 2024-01-11 DIAGNOSIS — F0390 Unspecified dementia without behavioral disturbance: Secondary | ICD-10-CM | POA: Diagnosis not present

## 2024-01-11 DIAGNOSIS — E785 Hyperlipidemia, unspecified: Secondary | ICD-10-CM | POA: Diagnosis not present

## 2024-02-28 DIAGNOSIS — R635 Abnormal weight gain: Secondary | ICD-10-CM | POA: Diagnosis not present

## 2024-02-28 DIAGNOSIS — E119 Type 2 diabetes mellitus without complications: Secondary | ICD-10-CM | POA: Diagnosis not present

## 2024-02-28 DIAGNOSIS — F0390 Unspecified dementia without behavioral disturbance: Secondary | ICD-10-CM | POA: Diagnosis not present

## 2024-03-14 DIAGNOSIS — F0390 Unspecified dementia without behavioral disturbance: Secondary | ICD-10-CM

## 2024-03-14 DIAGNOSIS — E1159 Type 2 diabetes mellitus with other circulatory complications: Secondary | ICD-10-CM

## 2024-03-14 DIAGNOSIS — K5900 Constipation, unspecified: Secondary | ICD-10-CM

## 2024-04-03 DIAGNOSIS — I4891 Unspecified atrial fibrillation: Secondary | ICD-10-CM

## 2024-04-03 DIAGNOSIS — F03918 Unspecified dementia, unspecified severity, with other behavioral disturbance: Secondary | ICD-10-CM

## 2024-04-03 DIAGNOSIS — R569 Unspecified convulsions: Secondary | ICD-10-CM

## 2024-04-03 DIAGNOSIS — I2510 Atherosclerotic heart disease of native coronary artery without angina pectoris: Secondary | ICD-10-CM

## 2024-04-03 DIAGNOSIS — I119 Hypertensive heart disease without heart failure: Secondary | ICD-10-CM | POA: Diagnosis not present

## 2024-04-03 DIAGNOSIS — Z6823 Body mass index (BMI) 23.0-23.9, adult: Secondary | ICD-10-CM

## 2024-04-03 DIAGNOSIS — M6281 Muscle weakness (generalized): Secondary | ICD-10-CM | POA: Diagnosis not present

## 2024-04-03 DIAGNOSIS — E559 Vitamin D deficiency, unspecified: Secondary | ICD-10-CM

## 2024-04-03 DIAGNOSIS — E1159 Type 2 diabetes mellitus with other circulatory complications: Secondary | ICD-10-CM

## 2024-04-03 DIAGNOSIS — K219 Gastro-esophageal reflux disease without esophagitis: Secondary | ICD-10-CM | POA: Diagnosis not present

## 2024-04-03 DIAGNOSIS — D509 Iron deficiency anemia, unspecified: Secondary | ICD-10-CM | POA: Diagnosis not present

## 2024-04-10 DIAGNOSIS — D509 Iron deficiency anemia, unspecified: Secondary | ICD-10-CM | POA: Diagnosis not present

## 2024-04-10 DIAGNOSIS — E785 Hyperlipidemia, unspecified: Secondary | ICD-10-CM | POA: Diagnosis not present

## 2024-04-10 DIAGNOSIS — I4891 Unspecified atrial fibrillation: Secondary | ICD-10-CM | POA: Diagnosis not present

## 2024-04-10 DIAGNOSIS — F0390 Unspecified dementia without behavioral disturbance: Secondary | ICD-10-CM | POA: Diagnosis not present

## 2024-04-15 DIAGNOSIS — M6281 Muscle weakness (generalized): Secondary | ICD-10-CM | POA: Diagnosis not present

## 2024-04-15 DIAGNOSIS — F0390 Unspecified dementia without behavioral disturbance: Secondary | ICD-10-CM | POA: Diagnosis not present

## 2024-04-15 DIAGNOSIS — E119 Type 2 diabetes mellitus without complications: Secondary | ICD-10-CM | POA: Diagnosis not present

## 2024-04-15 DIAGNOSIS — R2689 Other abnormalities of gait and mobility: Secondary | ICD-10-CM | POA: Diagnosis not present

## 2024-04-15 DIAGNOSIS — Z9181 History of falling: Secondary | ICD-10-CM

## 2024-04-18 DIAGNOSIS — F0390 Unspecified dementia without behavioral disturbance: Secondary | ICD-10-CM | POA: Diagnosis not present

## 2024-04-18 DIAGNOSIS — Z9181 History of falling: Secondary | ICD-10-CM | POA: Diagnosis not present

## 2024-04-18 DIAGNOSIS — S5012XA Contusion of left forearm, initial encounter: Secondary | ICD-10-CM

## 2024-04-18 DIAGNOSIS — R2689 Other abnormalities of gait and mobility: Secondary | ICD-10-CM | POA: Diagnosis not present

## 2024-04-18 DIAGNOSIS — M6281 Muscle weakness (generalized): Secondary | ICD-10-CM | POA: Diagnosis not present

## 2024-05-22 DIAGNOSIS — F0390 Unspecified dementia without behavioral disturbance: Secondary | ICD-10-CM

## 2024-05-22 DIAGNOSIS — J209 Acute bronchitis, unspecified: Secondary | ICD-10-CM | POA: Diagnosis not present

## 2024-05-22 DIAGNOSIS — R0989 Other specified symptoms and signs involving the circulatory and respiratory systems: Secondary | ICD-10-CM | POA: Diagnosis not present

## 2024-05-22 DIAGNOSIS — R5381 Other malaise: Secondary | ICD-10-CM | POA: Diagnosis not present

## 2024-05-22 DIAGNOSIS — R059 Cough, unspecified: Secondary | ICD-10-CM | POA: Diagnosis not present

## 2024-06-03 DIAGNOSIS — Z9181 History of falling: Secondary | ICD-10-CM

## 2024-06-03 DIAGNOSIS — M1990 Unspecified osteoarthritis, unspecified site: Secondary | ICD-10-CM

## 2024-06-03 DIAGNOSIS — R197 Diarrhea, unspecified: Secondary | ICD-10-CM | POA: Diagnosis not present

## 2024-06-03 DIAGNOSIS — K5090 Crohn's disease, unspecified, without complications: Secondary | ICD-10-CM

## 2024-06-03 DIAGNOSIS — K5900 Constipation, unspecified: Secondary | ICD-10-CM

## 2024-06-03 DIAGNOSIS — M6281 Muscle weakness (generalized): Secondary | ICD-10-CM | POA: Diagnosis not present

## 2024-06-03 DIAGNOSIS — F0390 Unspecified dementia without behavioral disturbance: Secondary | ICD-10-CM | POA: Diagnosis not present

## 2024-06-03 DIAGNOSIS — G894 Chronic pain syndrome: Secondary | ICD-10-CM

## 2024-06-03 DIAGNOSIS — R2689 Other abnormalities of gait and mobility: Secondary | ICD-10-CM | POA: Diagnosis not present

## 2024-06-20 DIAGNOSIS — E1165 Type 2 diabetes mellitus with hyperglycemia: Secondary | ICD-10-CM | POA: Diagnosis not present

## 2024-06-20 DIAGNOSIS — E8809 Other disorders of plasma-protein metabolism, not elsewhere classified: Secondary | ICD-10-CM | POA: Diagnosis not present

## 2024-06-20 DIAGNOSIS — F330 Major depressive disorder, recurrent, mild: Secondary | ICD-10-CM | POA: Diagnosis not present

## 2024-06-20 DIAGNOSIS — F0390 Unspecified dementia without behavioral disturbance: Secondary | ICD-10-CM

## 2024-06-20 DIAGNOSIS — E871 Hypo-osmolality and hyponatremia: Secondary | ICD-10-CM | POA: Diagnosis not present

## 2024-07-18 DIAGNOSIS — F0390 Unspecified dementia without behavioral disturbance: Secondary | ICD-10-CM | POA: Diagnosis not present

## 2024-07-18 DIAGNOSIS — R2689 Other abnormalities of gait and mobility: Secondary | ICD-10-CM | POA: Diagnosis not present

## 2024-07-18 DIAGNOSIS — Z9181 History of falling: Secondary | ICD-10-CM | POA: Diagnosis not present

## 2024-07-18 DIAGNOSIS — M6281 Muscle weakness (generalized): Secondary | ICD-10-CM | POA: Diagnosis not present

## 2024-07-19 DIAGNOSIS — Z23 Encounter for immunization: Secondary | ICD-10-CM | POA: Diagnosis not present

## 2024-07-29 DIAGNOSIS — F0390 Unspecified dementia without behavioral disturbance: Secondary | ICD-10-CM | POA: Diagnosis not present

## 2024-07-29 DIAGNOSIS — R159 Full incontinence of feces: Secondary | ICD-10-CM | POA: Diagnosis not present

## 2024-07-29 DIAGNOSIS — N393 Stress incontinence (female) (male): Secondary | ICD-10-CM | POA: Diagnosis not present

## 2024-07-29 DIAGNOSIS — S70222A Blister (nonthermal), left hip, initial encounter: Secondary | ICD-10-CM

## 2024-07-29 DIAGNOSIS — M6281 Muscle weakness (generalized): Secondary | ICD-10-CM | POA: Diagnosis not present

## 2024-07-31 DIAGNOSIS — F0390 Unspecified dementia without behavioral disturbance: Secondary | ICD-10-CM | POA: Diagnosis not present

## 2024-07-31 DIAGNOSIS — R634 Abnormal weight loss: Secondary | ICD-10-CM | POA: Diagnosis not present

## 2024-07-31 DIAGNOSIS — M6281 Muscle weakness (generalized): Secondary | ICD-10-CM | POA: Diagnosis not present

## 2024-08-16 DIAGNOSIS — E871 Hypo-osmolality and hyponatremia: Secondary | ICD-10-CM | POA: Diagnosis not present

## 2024-08-16 DIAGNOSIS — E1122 Type 2 diabetes mellitus with diabetic chronic kidney disease: Secondary | ICD-10-CM | POA: Diagnosis not present

## 2024-08-16 DIAGNOSIS — N183 Chronic kidney disease, stage 3 unspecified: Secondary | ICD-10-CM | POA: Diagnosis not present

## 2024-08-21 DIAGNOSIS — M6281 Muscle weakness (generalized): Secondary | ICD-10-CM | POA: Diagnosis not present

## 2024-08-21 DIAGNOSIS — L989 Disorder of the skin and subcutaneous tissue, unspecified: Secondary | ICD-10-CM | POA: Diagnosis not present

## 2024-08-21 DIAGNOSIS — F0390 Unspecified dementia without behavioral disturbance: Secondary | ICD-10-CM | POA: Diagnosis not present

## 2024-09-02 NOTE — Progress Notes (Unsigned)
 Cardiology Office Note:    Date:  09/03/2024   ID:  ROBERTLEE Frank Phillips, DOB 03-Aug-1946, MRN [id number]  PCP:  Frank Dirks, MD  Cardiologist:  Frank Leiter, MD    Referring MD: Frank Dirks, MD    ASSESSMENT:    1. Coronary artery disease involving native coronary artery of native heart without angina pectoris   2. SVT (supraventricular tachycardia)   3. Essential hypertension   4. Hyperlipidemia LDL goal <70    PLAN:    In order of problems listed above:  Frank Phillips is doing well from the perspective of CAD no anginal discomfort continue his current medical prophy Recurrence of SVT on combined beta calcium  channel blocker Blood pressure is well-controlled on current medications Continue as lipid-lowering statin and will check labs CMP and lipid profile   Next appointment: 1 year   Medication Adjustments/Labs and Tests Ordered: Current medicines are reviewed at length with the patient today.  Concerns regarding medicines are outlined above.  Orders Placed This Encounter  Procedures   Comp Met (CMET)   Lipid Profile   EKG 12-Lead   No orders of the defined types were placed in this encounter.    History of Present Illness:    Frank Phillips is a 78 y.o. male with a hx of coronary artery disease SVT hypertension hyperlipidemia dementia and type 2 diabetes last seen 01/10.2024.  Compliance with diet, lifestyle and medications: Yes  Frank Phillips is seen along with his daughter.  Unfortunately he is quite sedentary He has had no anginal therapy including aspirin  Eliquis carvedilol  Tiazac and his high intensity statin. They are unsure if he has had lab work done and today we will check a lipid profile andCMP Past Medical History:  Diagnosis Date   Anxiety    Aortic atherosclerosis 06/06/2018   Arthritis    Complication of anesthesia     i HAD SOME KIND OF PROBLEM ,BUT I DONT REMEMBER WHAT IT WAS    Crohn disease (HCC)    Dementia (HCC)    Depression    Diabetes (HCC)    Fall at home  04/06/2016   High blood pressure    High cholesterol    Kidney stones    Orthostatic hypotension 03/2016   Stroke [facility])    UTI (urinary tract infection) 05/2018   WITH HEMATURIA    Current Medications: Current Meds  Medication Sig   acetaminophen  (TYLENOL ) 650 MG CR tablet Take 1,300 mg by mouth every 8 (eight) hours as needed for pain.   alum & mag hydroxide-simeth (MYLANTA) 200-200-20 MG/5ML suspension Take 30 mLs by mouth every 4 (four) hours as needed for indigestion or heartburn.   aspirin  EC 81 MG EC tablet Take 1 tablet (81 mg total) by mouth daily. Swallow whole.   atorvastatin  (LIPITOR) 20 MG tablet Take 20 mg by mouth daily.   carvedilol  (COREG ) 6.25 MG tablet Take 6.25 mg by mouth 2 (two) times daily.   chlorhexidine  (PERIDEX ) 0.12 % solution in the morning and at bedtime.   diltiazem (CARDIZEM) 60 MG tablet Take 60 mg by mouth 2 (two) times daily.   donepezil  (ARICEPT ) 10 MG tablet Take 10 mg by mouth at bedtime.   DULoxetine  (CYMBALTA ) 30 MG capsule Take 90 mg by mouth daily.   ELIQUIS 5 MG TABS tablet Take 5 mg by mouth 2 (two) times daily.   ferrous sulfate 325 (65 FE) MG tablet Take 325 mg by mouth daily with breakfast.   glipiZIDE  (GLUCOTROL ) 10 MG tablet  Take 10 mg by mouth 2 (two) times daily before a meal.   insulin  glargine (LANTUS ) 100 UNIT/ML injection Inject 20 Units into the skin at bedtime.   isosorbide  mononitrate (IMDUR ) 30 MG 24 hr tablet Take 30 mg by mouth daily. Hold if BP is less than 100   lithium carbonate 300 MG capsule Take 300 mg by mouth 2 (two) times daily with a meal.   Magnesium  Oxide 400 MG CAPS Take 1 capsule (400 mg total) by mouth at bedtime.   melatonin 5 MG TABS Take 5 mg by mouth at bedtime.   metFORMIN  (GLUCOPHAGE ) 500 MG tablet Take 1 tablet by mouth 2 (two) times daily.   nitroGLYCERIN  (NITROSTAT ) 0.4 MG SL tablet Place 1 tablet (0.4 mg total) under the tongue every 5 (five) minutes x 3 doses as needed for chest pain.   pantoprazole   (PROTONIX ) 40 MG tablet Take 40 mg by mouth daily.    tamsulosin  (FLOMAX ) 0.4 MG CAPS capsule Take 0.4 mg by mouth daily.   temazepam  (RESTORIL ) 30 MG capsule Take 30 mg by mouth at bedtime as needed for sleep.      EKGs/Labs/Other Studies Reviewed:    The following studies were reviewed today:  Cardiac Studies & Procedures   ______________________________________________________________________________________________ CARDIAC CATHETERIZATION  CARDIAC CATHETERIZATION 02/01/2021  Conclusion Conclusions: 1. Severe single-vessel coronary artery disease with 95% in-stent restenosis of proximal RCA and chronic total occlusion of the mid RCA distal to takeoff of RV marginal.  The distal vessel fills via faint left-to-right collaterals. 2. Moderate diffuse LAD and LCx disease of up to 60-70%.  Small branch of OM1 has 99% ostial stenosis but is too small/distal for intervention. 3. Normal left ventricular systolic function and filling pressure. 4. Unsuccessful attempted PCI of proximal/mid RCA occlusion with failure to cross lesion with guidewire.  Intervention was complicated by proximal RCA dissection.  Recommendations: 1. Dual antiplatelet therapy with aspirin  and clopidogrel  for at least 12 months. 2. Aggressive secondary prevention of coronary artery disease. 3. Titrate nitroglycerin  for continued relief of chest pain as well as for blood pressure control. 4. If the patient has refractory angina, could consider PCI to OM1/OM2, though these lesions do not appear critical.  Frank Hanson, MD [facility]  Findings Coronary Findings Diagnostic  Dominance: Right  Left Main Vessel is large. There is mild diffuse disease throughout the vessel.  Left Anterior Descending Vessel is moderate in size. There is moderate diffuse disease throughout the vessel. The vessel is severely calcified.  First Diagonal Branch Vessel is moderate in size.  Second Diagonal Branch Vessel is small in  size.  Left Circumflex Vessel is large. Ost Cx to Prox Cx lesion is 50% stenosed. The lesion is eccentric and irregular.  First Obtuse Marginal Branch Vessel is large in size. 1st Mrg lesion is 65% stenosed.  Lateral First Obtuse Marginal Branch Vessel is small in size. Lat 1st Mrg lesion is 90% stenosed.  Second Obtuse Marginal Branch Vessel is moderate in size. 2nd Mrg lesion is 60% stenosed.  Third Obtuse Marginal Branch Vessel is small in size.  Right Coronary Artery Vessel is moderate in size. Prox RCA-1 lesion is 50% stenosed. Prox RCA-2 lesion is 95% stenosed. The lesion was previously treated using a stent (unknown type) at an unknown date. Previously placed stent displays restenosis. Mid RCA to Dist RCA lesion is 100% stenosed. The lesion is chronically occluded with left-to-right collateral flow. The lesion was previously treated.  Right Posterior Atrioventricular Artery Collaterals RPAV filled by  collaterals from Dist Cx.  Intervention  No interventions have been documented.     ECHOCARDIOGRAM  ECHOCARDIOGRAM COMPLETE 04/07/2016  Narrative ** *[facility]* [address] [place], [place] [address] [phone]  ------------------------------------------------------------------- Transthoracic Echocardiography  Patient:    Frank Phillips, Frank Phillips MR #:       [id number] Study Date: 04/07/2016 Gender:     Phillips Age:        31 Height:     167.6 cm Weight:     72.3 kg BSA:        1.85 Phillips^2 Pt. Status: Room:       [id number]  ATTENDING    Lorriane Viktoria CROME SONOGRAPHER  Koren Daring, RDCS ADMITTING    Frank Phillips, Frank Phillips PERFORMING   [facility], Inpatient  cc:  ------------------------------------------------------------------- LV EF: 65% -   70%  ------------------------------------------------------------------- Indications:      Syncope 780.2.  ------------------------------------------------------------------- History:    PMH:   Stroke.  Risk factors:  Diabetes mellitus. Hypercholesterolemia.  ------------------------------------------------------------------- Study Conclusions  - Left ventricle: The cavity size was normal. There was moderate focal basal hypertrophy. Systolic function was vigorous. The estimated ejection fraction was in the range of 65% to 70%. Wall motion was normal; there were no regional wall motion abnormalities. Features are consistent with a pseudonormal left ventricular filling pattern, with concomitant abnormal relaxation and increased filling pressure (grade 2 diastolic dysfunction). - Aortic valve: Moderately calcified annulus. Trileaflet; mildly thickened, mildly calcified leaflets. - Mitral valve: Minimal focal calcification of the anterior leaflet (medial segment(s)). There was trivial regurgitation. - Tricuspid valve: There was trivial regurgitation. - Pulmonic valve: There was mild regurgitation. - Pulmonary arteries: PA peak pressure: 31 mm Hg (S).  Transthoracic echocardiography.  Phillips-mode, complete 2D, spectral Doppler, and color Doppler.  Birthdate:  Patient birthdate: November 30, 1945.  Age:  Patient is 78 yr old.  Sex:  Gender: male. BMI: 25.8 kg/Phillips^2.  Blood pressure:     145/69  Patient status: Inpatient.  Study date:  Study date: 04/07/2016. Study time: 12:03 PM.  Location:  Bedside.  -------------------------------------------------------------------  ------------------------------------------------------------------- Left ventricle:  The cavity size was normal. There was moderate focal basal hypertrophy. Systolic function was vigorous. The estimated ejection fraction was in the range of 65% to 70%. Wall motion was normal; there were no regional wall motion abnormalities. Features are consistent with a pseudonormal left ventricular filling pattern, with concomitant abnormal relaxation and increased filling pressure (grade 2 diastolic  dysfunction).  ------------------------------------------------------------------- Aortic valve:   Moderately calcified annulus. Trileaflet; mildly thickened, mildly calcified leaflets. Mobility was not restricted. Doppler:  Transvalvular velocity was within the normal range. There was no stenosis. There was no regurgitation.  ------------------------------------------------------------------- Aorta:  Aortic root: The aortic root was normal in size.  ------------------------------------------------------------------- Mitral valve:   Minimal focal calcification of the anterior leaflet (medial segment(s)). Mobility was not restricted.  Doppler: Transvalvular velocity was within the normal range. There was no evidence for stenosis. There was trivial regurgitation.    Peak gradient (D): 3 mm Hg.  ------------------------------------------------------------------- Left atrium:  The atrium was normal in size.  ------------------------------------------------------------------- Right ventricle:  The cavity size was normal. Wall thickness was normal. Systolic function was normal.  ------------------------------------------------------------------- Pulmonic valve:    Structurally normal valve.   Cusp separation was normal.  Doppler:  Transvalvular velocity was within the normal range. There was no evidence for stenosis. There was mild regurgitation.  ------------------------------------------------------------------- Tricuspid valve:   Structurally normal valve.  Doppler: Transvalvular velocity was within the normal range. There was trivial regurgitation.  ------------------------------------------------------------------- Pulmonary artery:   The main pulmonary artery was normal-sized. Systolic pressure was within the normal range.  ------------------------------------------------------------------- Right atrium:  The atrium was normal in  size.  ------------------------------------------------------------------- Pericardium:  There was no pericardial effusion.  ------------------------------------------------------------------- Systemic veins: Inferior vena cava: The vessel was normal in size.  ------------------------------------------------------------------- Measurements  Left ventricle                         Value        Reference LV ID, ED, PLAX chordal        (L)     39    mm     43 - 52 LV ID, ES, PLAX chordal                28    mm     23 - 38 LV fx shortening, PLAX chordal (L)     28    %      >=29 LV PW thickness, ED                    11    mm     --------- IVS/LV PW ratio, ED                    1.18         <=1.3 LV e&', lateral                         7.94  cm/s   --------- LV E/e&', lateral                       10.08        --------- LV e&', medial                          5.66  cm/s   --------- LV E/e&', medial                        14.13        --------- LV e&', average                         6.8   cm/s   --------- LV E/e&', average                       11.76        ---------  Ventricular septum                     Value        Reference IVS thickness, ED                      13    mm     ---------  LVOT                                   Value        Reference LVOT ID, S                             23  mm     --------- LVOT area                              4.15  cm^2   ---------  Aorta                                  Value        Reference Aortic root ID, ED                     33    mm     ---------  Left atrium                            Value        Reference LA ID, A-P, ES                         41    mm     --------- LA ID/bsa, A-P                 (H)     2.22  cm/Phillips^2 <=2.2 LA volume, S                           46.9  ml     --------- LA volume/bsa, S                       25.4  ml/Phillips^2 --------- LA volume, ES, 1-p A4C                 38.9  ml     --------- LA volume/bsa, ES, 1-p  A4C             21.1  ml/Phillips^2 --------- LA volume, ES, 1-p A2C                 55.4  ml     --------- LA volume/bsa, ES, 1-p A2C             30    ml/Phillips^2 ---------  Mitral valve                           Value        Reference Mitral E-wave peak velocity            80    cm/s   --------- Mitral A-wave peak velocity            84.9  cm/s   --------- Mitral deceleration time               204   ms     150 - 230 Mitral peak gradient, D                3     mm Hg  --------- Mitral E/A ratio, peak                 0.94         ---------  Pulmonary arteries                     Value        Reference PA pressure, S, DP             (  H)     31    mm Hg  <=30  Tricuspid valve                        Value        Reference Tricuspid regurg peak velocity         265   cm/s   --------- Tricuspid peak RV-RA gradient          28    mm Hg  ---------  Right ventricle                        Value        Reference RV s&', lateral, S                      26    cm/s   ---------  Legend: (L)  and  (H)  mark values outside specified reference range.  ------------------------------------------------------------------- Prepared and Electronically Authenticated by  Frank Bihari, MD 2017-06-08T15:35:46          ______________________________________________________________________________________________      EKG Interpretation Date/Time:  Tuesday September 03 2024 13:04:16 EST Ventricular Rate:  62 PR Interval:  182 QRS Duration:  76 QT Interval:  480 QTC Calculation: 487 R Axis:   -1  Text Interpretation: Unusual P axis, possible ectopic atrial rhythm with occasional Premature ventricular complexes Left ventricular hypertrophy with repolarization abnormality ( R in aVL ) Inferior infarct , age undetermined When compared with ECG of 03-Feb-2021 07:06, No significant change since last tracing Right bundle branch block is no longer Present Inferior infarct is now Present Confirmed by Frank Phillips  [id number]) on 09/03/2024 1:08:11 PM   Recent Labs: No results found for requested labs within last 365 days.  Recent Lipid Panel    Component Value Date/Time   CHOL 84 04/27/2022 1151   TRIG 146 04/27/2022 1151   HDL 26 (L) 04/27/2022 1151   CHOLHDL 3.2 04/27/2022 1151   LDLCALC 35 04/27/2022 1151    Physical Exam:    VS:  BP 120/68   Pulse 62   Ht 5' 8 (1.727 Phillips)   Wt 161 lb 9.6 oz (73.3 kg)   SpO2 96%   BMI 24.57 kg/Phillips     Wt Readings from Last 3 Encounters:  09/03/24 161 lb 9.6 oz (73.3 kg)  11/09/22 158 lb (71.7 kg)  04/27/22 162 lb 6.4 oz (73.7 kg)     GEN: Parkinson's appears she has a resting tremor well nourished, well developed in no acute distress HEENT: Normal NECK: No JVD; No carotid bruits LYMPHATICS: No lymphadenopathy CARDIAC: RRR, no murmurs, rubs, gallops RESPIRATORY:  Clear to auscultation without rales, wheezing or rhonchi  ABDOMEN: Soft, non-tender, non-distended MUSCULOSKELETAL:  No edema; No deformity  SKIN: Warm and dry NEUROLOGIC:  Alert and oriented x 3 PSYCHIATRIC:  Normal affect    Signed, Phillips Monetta, MD  09/03/2024 4:25 PM    [facility]

## 2024-09-03 ENCOUNTER — Encounter: Payer: Self-pay | Admitting: *Deleted

## 2024-09-03 ENCOUNTER — Ambulatory Visit: Attending: Cardiology | Admitting: Cardiology

## 2024-09-03 ENCOUNTER — Encounter: Payer: Self-pay | Admitting: Cardiology

## 2024-09-03 VITALS — BP 120/68 | HR 62 | Ht 68.0 in | Wt 161.6 lb

## 2024-09-03 DIAGNOSIS — I471 Supraventricular tachycardia, unspecified: Secondary | ICD-10-CM

## 2024-09-03 DIAGNOSIS — E785 Hyperlipidemia, unspecified: Secondary | ICD-10-CM

## 2024-09-03 DIAGNOSIS — I10 Essential (primary) hypertension: Secondary | ICD-10-CM | POA: Diagnosis not present

## 2024-09-03 DIAGNOSIS — N401 Enlarged prostate with lower urinary tract symptoms: Secondary | ICD-10-CM | POA: Insufficient documentation

## 2024-09-03 DIAGNOSIS — I2510 Atherosclerotic heart disease of native coronary artery without angina pectoris: Secondary | ICD-10-CM

## 2024-09-03 NOTE — Patient Instructions (Signed)
 Medication Instructions:  Your physician recommends that you continue on your current medications as directed. Please refer to the Current Medication list given to you today.  *If you need a refill on your cardiac medications before your next appointment, please call your pharmacy*  Lab Work: Your physician recommends that you return for lab work in:   Labs today: CMP, Lipids  If you have labs (blood work) drawn today and your tests are completely normal, you will receive your results only by: MyChart Message (if you have MyChart) OR A paper copy in the mail If you have any lab test that is abnormal or we need to change your treatment, we will call you to review the results.  Testing/Procedures: None  Follow-Up: At [facility], you and your health needs are our priority.  As part of our continuing mission to provide you with exceptional heart care, our providers are all part of one team.  This team includes your primary Cardiologist (physician) and Advanced Practice Providers or APPs (Physician Assistants and Nurse Practitioners) who all work together to provide you with the care you need, when you need it.  Your next appointment:   1 year(s)  Provider:   Redell Leiter, MD    We recommend signing up for the patient portal called MyChart.  Sign up information is provided on this After Visit Summary.  MyChart is used to connect with patients for Virtual Visits (Telemedicine).  Patients are able to view lab/test results, encounter notes, upcoming appointments, etc.  Non-urgent messages can be sent to your provider as well.   To learn more about what you can do with MyChart, go to [email or web address].   Other Instructions None

## 2024-09-04 ENCOUNTER — Ambulatory Visit: Payer: Self-pay | Admitting: Cardiology

## 2024-09-04 LAB — COMPREHENSIVE METABOLIC PANEL WITH GFR
ALT: 15 IU/L (ref 0–44)
AST: 18 IU/L (ref 0–40)
Albumin: 4.3 g/dL (ref 3.8–4.8)
Alkaline Phosphatase: 112 IU/L (ref 47–123)
BUN/Creatinine Ratio: 24 (ref 10–24)
BUN: 22 mg/dL (ref 8–27)
Bilirubin Total: 0.4 mg/dL (ref 0.0–1.2)
CO2: 22 mmol/L (ref 20–29)
Calcium: 9.9 mg/dL (ref 8.6–10.2)
Chloride: 100 mmol/L (ref 96–106)
Creatinine, Ser: 0.93 mg/dL (ref 0.76–1.27)
Globulin, Total: 1.9 g/dL (ref 1.5–4.5)
Glucose: 143 mg/dL — ABNORMAL HIGH (ref 70–99)
Potassium: 5 mmol/L (ref 3.5–5.2)
Sodium: 139 mmol/L (ref 134–144)
Total Protein: 6.2 g/dL (ref 6.0–8.5)
eGFR: 84 mL/min/1.73 (ref >59)

## 2024-09-04 LAB — LIPID PANEL
Chol/HDL Ratio: 2.8 ratio (ref 0.0–5.0)
Cholesterol, Total: 81 mg/dL — ABNORMAL LOW (ref 100–199)
HDL: 29 mg/dL — ABNORMAL LOW (ref >39)
LDL Chol Calc (NIH): 26 mg/dL (ref 0–99)
Triglycerides: 156 mg/dL — ABNORMAL HIGH (ref 0–149)
VLDL Cholesterol Cal: 26 mg/dL (ref 5–40)

## 2024-09-18 DIAGNOSIS — R54 Age-related physical debility: Secondary | ICD-10-CM

## 2024-09-18 DIAGNOSIS — F0390 Unspecified dementia without behavioral disturbance: Secondary | ICD-10-CM

## 2024-09-18 DIAGNOSIS — Z9181 History of falling: Secondary | ICD-10-CM

## 2024-09-18 DIAGNOSIS — M6281 Muscle weakness (generalized): Secondary | ICD-10-CM

## 2024-10-10 DIAGNOSIS — R2689 Other abnormalities of gait and mobility: Secondary | ICD-10-CM | POA: Diagnosis not present

## 2024-10-10 DIAGNOSIS — M6281 Muscle weakness (generalized): Secondary | ICD-10-CM | POA: Diagnosis not present

## 2024-10-10 DIAGNOSIS — Z9181 History of falling: Secondary | ICD-10-CM | POA: Diagnosis not present
